# Patient Record
Sex: Male | Born: 1935 | Race: White | Hispanic: No | Marital: Married | Smoking: Former smoker
Health system: Southern US, Community
[De-identification: ages and names within clinical notes are randomized; demographics above are authoritative.]

## PROBLEM LIST (undated history)

## (undated) DIAGNOSIS — M1990 Unspecified osteoarthritis, unspecified site: Secondary | ICD-10-CM

## (undated) DIAGNOSIS — I4891 Unspecified atrial fibrillation: Secondary | ICD-10-CM

## (undated) DIAGNOSIS — I2510 Atherosclerotic heart disease of native coronary artery without angina pectoris: Secondary | ICD-10-CM

## (undated) DIAGNOSIS — E785 Hyperlipidemia, unspecified: Secondary | ICD-10-CM

## (undated) DIAGNOSIS — I10 Essential (primary) hypertension: Secondary | ICD-10-CM

## (undated) DIAGNOSIS — T4145XA Adverse effect of unspecified anesthetic, initial encounter: Secondary | ICD-10-CM

## (undated) DIAGNOSIS — IMO0001 Reserved for inherently not codable concepts without codable children: Secondary | ICD-10-CM

## (undated) DIAGNOSIS — I491 Atrial premature depolarization: Secondary | ICD-10-CM

## (undated) DIAGNOSIS — T8859XA Other complications of anesthesia, initial encounter: Secondary | ICD-10-CM

## (undated) DIAGNOSIS — I252 Old myocardial infarction: Secondary | ICD-10-CM

## (undated) DIAGNOSIS — K219 Gastro-esophageal reflux disease without esophagitis: Secondary | ICD-10-CM

## (undated) DIAGNOSIS — I519 Heart disease, unspecified: Secondary | ICD-10-CM

## (undated) DIAGNOSIS — C801 Malignant (primary) neoplasm, unspecified: Secondary | ICD-10-CM

## (undated) HISTORY — DX: Atrial premature depolarization: I49.1

## (undated) HISTORY — DX: Unspecified atrial fibrillation: I48.91

## (undated) HISTORY — DX: Old myocardial infarction: I25.2

## (undated) HISTORY — PX: COLONOSCOPY: SHX174

## (undated) HISTORY — DX: Heart disease, unspecified: I51.9

## (undated) HISTORY — DX: Essential (primary) hypertension: I10

## (undated) HISTORY — DX: Hyperlipidemia, unspecified: E78.5

## (undated) HISTORY — DX: Atherosclerotic heart disease of native coronary artery without angina pectoris: I25.10

---

## 1998-12-04 ENCOUNTER — Encounter: Payer: Self-pay | Admitting: Gastroenterology

## 1998-12-04 ENCOUNTER — Ambulatory Visit (HOSPITAL_COMMUNITY): Admission: RE | Admit: 1998-12-04 | Discharge: 1998-12-04 | Payer: Self-pay | Admitting: Gastroenterology

## 1998-12-16 ENCOUNTER — Ambulatory Visit (HOSPITAL_COMMUNITY): Admission: RE | Admit: 1998-12-16 | Discharge: 1998-12-16 | Payer: Self-pay | Admitting: Gastroenterology

## 1998-12-16 ENCOUNTER — Encounter: Payer: Self-pay | Admitting: Gastroenterology

## 1998-12-24 ENCOUNTER — Ambulatory Visit (HOSPITAL_COMMUNITY): Admission: RE | Admit: 1998-12-24 | Discharge: 1998-12-24 | Payer: Self-pay | Admitting: Gastroenterology

## 2000-03-29 ENCOUNTER — Encounter (INDEPENDENT_AMBULATORY_CARE_PROVIDER_SITE_OTHER): Payer: Self-pay

## 2000-03-29 ENCOUNTER — Ambulatory Visit (HOSPITAL_COMMUNITY): Admission: RE | Admit: 2000-03-29 | Discharge: 2000-03-29 | Payer: Self-pay | Admitting: Gastroenterology

## 2000-04-05 ENCOUNTER — Encounter: Payer: Self-pay | Admitting: Gastroenterology

## 2000-04-05 ENCOUNTER — Ambulatory Visit (HOSPITAL_COMMUNITY): Admission: RE | Admit: 2000-04-05 | Discharge: 2000-04-05 | Payer: Self-pay | Admitting: Gastroenterology

## 2003-03-02 ENCOUNTER — Ambulatory Visit (HOSPITAL_COMMUNITY): Admission: RE | Admit: 2003-03-02 | Discharge: 2003-03-02 | Payer: Self-pay | Admitting: Internal Medicine

## 2003-03-02 ENCOUNTER — Encounter: Payer: Self-pay | Admitting: Cardiology

## 2005-09-10 ENCOUNTER — Ambulatory Visit (HOSPITAL_COMMUNITY): Admission: RE | Admit: 2005-09-10 | Discharge: 2005-09-10 | Payer: Self-pay | Admitting: Internal Medicine

## 2006-11-13 DIAGNOSIS — I252 Old myocardial infarction: Secondary | ICD-10-CM

## 2006-11-13 HISTORY — DX: Old myocardial infarction: I25.2

## 2006-11-26 ENCOUNTER — Inpatient Hospital Stay (HOSPITAL_COMMUNITY): Admission: AD | Admit: 2006-11-26 | Discharge: 2006-11-30 | Payer: Self-pay | Admitting: *Deleted

## 2006-11-26 HISTORY — PX: CARDIAC CATHETERIZATION: SHX172

## 2006-11-29 ENCOUNTER — Encounter (INDEPENDENT_AMBULATORY_CARE_PROVIDER_SITE_OTHER): Payer: Self-pay | Admitting: *Deleted

## 2010-11-18 ENCOUNTER — Ambulatory Visit: Payer: Self-pay | Admitting: Cardiology

## 2011-05-01 NOTE — Cardiovascular Report (Signed)
Luke Contreras, Luke Contreras                  ACCOUNT NO.:  [id number]   MEDICAL RECORD NO.:  [id number]          PATIENT TYPE:  OIB   LOCATION:  2854                         FACILITY:  [facility]   PHYSICIAN:  Elmore Guise., M.D.DATE OF BIRTH:  04/03/1930   DATE OF PROCEDURE:  11/26/2006  DATE OF DISCHARGE:                            CARDIAC CATHETERIZATION   INDICATIONS FOR PROCEDURE:  Recent anterior MI.   HISTORY OF PRESENT ILLNESS:  Luke Contreras is a very pleasant 77 year old  white male who has been having stuttering off and on chest pain for 3  days.  His initial episode happened on Tuesday, lasting 30 minutes.  He  had a second episode on Wednesday, lasting for approximately 12 hours.  Since that time, he has had mild off and on dyspnea as well as some  uncomfortable feelings.  He was taken to the cath lab for further  evaluation.   DESCRIPTION OF PROCEDURE:  The patient was brought to the cardiac cath  lab after appropriate informed consent.  He was prepped and draped in a  sterile fashion.  Approximately 20 mL of 1% lidocaine was used for local  anesthesia.  A 6-French sheath was placed in the right femoral artery  without difficulty.  Coronary angiography, LV angiography were then  performed.  Sheath was left in place for continued intervention of his  proximal /mid LAD.   FINDINGS:  1. Left main:  Large vessel and normal-appearing  2. LAD:  Mid 100% occlusion after first septal perforator.  He does      have faint left-to-left collaterals filling his first diagonal      vessel.  3. LCX:  Nondominant, large vessel.  4. OM-1:  High branching moderate-to-large vessel with mild luminal      irregularities.  5. OM-2:  Moderate-to-large sized vessel with mild luminal      irregularities.  6. Ramus intermedius:  Moderate-to-large vessel with mild luminal      irregularities.  7. RCA:  Dominant large vessel with proximal shepherd's crook noted.      He has moderate proximal luminal  irregularities up to 30-40% noted.      He has a mid 50-60% stenosis with mild to moderate distal luminal      irregularities.  His PDA and PLV show mild luminal irregularities      with faint right to left collaterals filling proximal LAD and      septal perforators noted.  8. LV:  EF is approximately 40% with mild anterior hypokinesis and      distal anterior akinesis with periapical dyskinesis.   IMPRESSION:  1. A 100% obstructive proximal/mid left anterior descending artery,      consistent with recent anterior myocardial infarction.  2. Moderate mid right coronary artery disease.  3. Mild left ventricular dysfunction with an ejection fraction of 40%      with distal anterior akinesis and periapical dyskinesis.   PLAN:  Elective PCI proximal LAD, will start aggressive medical therapy  as indicated.      Elmore Guise., M.D.  Electronically Signed  TWK/MEDQ  D:  11/26/2006  T:  11/26/2006  Job:  [id number]

## 2011-05-01 NOTE — Cardiovascular Report (Signed)
[name], [name]                  ACCOUNT NO.:  [id number]   MEDICAL RECORD NO.:  [id number]          PATIENT TYPE:  OIB   LOCATION:  2854                         FACILITY:  [facility]   PHYSICIAN:  Vesta Mixer, M.D. DATE OF BIRTH:  March 17, 1930   DATE OF PROCEDURE:  11/26/2006  DATE OF DISCHARGE:                            CARDIAC CATHETERIZATION   Mr. Luke Contreras is a 77 year old gentleman who presents with several days of  intermittent chest pain.  He presented to Dr. Burton Apley' office.  He was found to have markedly elevated CPK level with a troponin level  of around 23.  He was referred to our office.  Dr. Reyes Ivan performed a  diagnostic heart catheterization and the patient was found to have an  occluded proximal left anterior descending artery.   He had a large ramus intermediate branch and large circumflex vessels  which had minor luminal irregularities.  The right coronary artery is  only moderate in size.  There was a 50-60% mid lesion in the right  coronary artery.  The right coronary artery supplies collaterals to the  LAD through the septal branches.   The patient has dyskinesis of the apex but with some well-preserved wall  motion of the midanterior wall.   PCI ATTEMPT/:  The sheath was upgraded to a 7-French sheath.  The left  main was cannulated using a 7-French Judkins left 5 guide.  Five  thousand units of heparin were given.  A double bolus Integrilin drip  was given.   We attempted to cross the lesion with an [employer] soft wire.  This was not  successful.  We then used a [employer] wire with an angioplasty  balloon as backup.  This also was unsuccessful even after many tries.   We decided to stop the procedure at that point.  This lesion appears to  be fairly chronic and certainly is older than 45 days old.  We will  continue with medical therapy.  We will review other options including  medical therapy versus bypass grafting.     ______________________________  Vesta Mixer, M.D.     PJN/MEDQ  D:  11/26/2006  T:  11/26/2006  Job:  [id number]

## 2011-05-01 NOTE — H&P (Signed)
NAMEJR, MILLIRON                  ACCOUNT NO.:  [id number]   MEDICAL RECORD NO.:  [id number]          PATIENT TYPE:  OIB   LOCATION:  2807                         FACILITY:  [facility]   PHYSICIAN:  Elmore Guise., M.D.DATE OF BIRTH:  [birth year]   DATE OF ADMISSION:  11/26/2006  DATE OF DISCHARGE:                              HISTORY & PHYSICAL   INDICATION FOR ADMISSION:  Recent anterior infarction with significantly  elevated troponin.   PRIMARY CARE PHYSICIAN:  Antony Madura, M.D.   HISTORY OF PRESENT ILLNESS:  The patient is a very pleasant 77 year old  white male with a past medical history of hypertension who presents for  evaluation of chest pain and malaise.  The patient reports normal state  of health until this Tuesday.  Tuesday night, he had an episode of chest  pain which he described as a retrosternal tightness that occurred while  eating dinner. This lasted somewhere between 30 and 45 minutes.  His  symptoms resolved.  He went to bed and was feeling back to normal.  He  then awoke on Wednesday, did his normal activities. On Wednesday night,  he started having some chest pain, again, while eating dinner, however,  this pain was much worse than his prior symptoms associated with  shortness of breath and nausea.  This lasted all night. The next  morning, he had some malaise and some dyspnea with exertion. He went for  evaluation, had blood work done which showed some elevated cardiac  enzymes.  He was then sent for cardiac evaluation.  The patient denies  any further chest pain, however, does report some dyspnea with brisk  walking.  He does have some off and on twinges with activity noted.  He denies any fever, no cough, no orthopnea, no PND, no palpitations. He  denies any difficulty with his current medications.   REVIEW OF SYSTEMS:  As per HPI.  All others systems are negative.   CURRENT MEDICATIONS:  Atenolol 50 mg daily and clorazepate 7.5 mg daily.   ALLERGIES:  None.   FAMILY HISTORY:  Positive for heart disease in his father, ITP in a  brother, hypertension and diabetes in a brother.   SOCIAL HISTORY:  He is married.  He is retired.  He does not currently  exercise.  He stays very active around the house and with yard work.  He  smoked for 30 pack years, quit 27 years ago.  No alcohol.  No caffeine.   PAST SURGICAL HISTORY:  None.   PHYSICAL EXAMINATION:  VITAL SIGNS:  His weight is 215 pounds.  His blood pressure is 90/50.  His heart rate is 51 and regular.  GENERAL:  He is a very pleasant elderly white male, alert and oriented  x4 in no acute distress.  HEENT: Appear normal.  NECK: Supple.  No lymphadenopathy. 2+ carotids.  No JVD and no bruits.  LUNGS:  Clear.  HEART:  Regular with a normal S1-S2, soft S4 noted.  ABDOMEN:  Soft, nontender, nondistended.  No rebound or guarding.  No  abdominal bruits.  EXTREMITIES:  Warm with 2+ pulses and no significant edema.  NEUROLOGICAL:  Cranial nerves are intact, there are no focal deficits.   His blood work was reviewed and showed a CPK of 2255 with an MB of 259  and a troponin of 24.7. His EKG shows sinus bradycardia with a rate of  51 per minute with normal axis.  He has anterior Q-waves from V1 to V3  with very minimal ST elevation and deep symmetric inverted T-waves from  V1 through V4.  His total cholesterol was 166, triglycerides were 149,  LDL was 103, and HDL of 33. A hemoglobin A1c was 5.7.  TSH was 1.2.  White count 5.9, hemoglobin of 15, platelet count of 197. BUN and  creatinine of 21 and 1. Potassium level 4.1   IMPRESSION:  1. Recent anterior myocardial infarction.  2. Continued, likely resolving, EKG changes.  3. Significant troponin elevation likely secondary to #1 above.  4. Borderline hypotension.   PLAN:  The patient will be given aspirin then taken to the cardiac cath  lab for further evaluation.  He will be treated with anticoagulants. We  will hold his  atenolol since he is profoundly bradycardic and borderline  hypotensive at this time.  I did discuss the risks and benefits of  cardiac catheterization with him.  He wishes to proceed.  We will start  aspirin, heparin, Integrilin, and statin medication.      Elmore Guise., M.D.  Electronically Signed     TWK/MEDQ  D:  11/26/2006  T:  11/26/2006  Job:  [id number]

## 2011-05-01 NOTE — Discharge Summary (Signed)
Luke Contreras, Luke Contreras                  ACCOUNT NO.:  [id number]   MEDICAL RECORD NO.:  [id number]          PATIENT TYPE:  INP   LOCATION:  2010                         FACILITY:  [facility]   PHYSICIAN:  Elmore Guise., M.D.DATE OF BIRTH:  [birth year]   DATE OF ADMISSION:  11/26/2006  DATE OF DISCHARGE:  11/30/2006                               DISCHARGE SUMMARY   DISCHARGE DIAGNOSIS:  1. Recent anterior myocardial infarction.  2. Dyslipidemia.  3. History of hypertension.  4. Brief episode of atrial fibrillation.   HISTORY OF PRESENT ILLNESS:  The patient is a very pleasant 77 year old  white male who presented with 12 hours of chest pain and abnormal  cardiac enzymes.  He was taken to the hospital for further evaluation.   HOSPITAL COURSE:  The patient was taken to the catheterization lab for  urgent evaluation.  He was found to have a 100% proximal LAD occlusion.  Multiple attempts were made to cross the blockage which were  unsuccessful.  The patient remained chest pain free and was treated  medically.  The patient stayed in the cardiac step-down unit for  approximately 3 days. On postprocedure day #1, he had a brief episode of  atrial fibrillation, heart rate increasing up to 140 beats per minute.  He became short of breath and had twinges of chest pain.  His cardiac  enzymes were cycled. He had no further worsening with his cardiac  enzymes.  His troponin I peaked at 32. He did continue to have a  evolution of his EKG changes with anterior Q waves and improving ST  elevation noted in leads V1 through V4.  He has now been up and  ambulatory for the last 48 hours. He has had no further atrial  fibrillation.  He denies any chest pain or shortness of breath.  He did  have an echocardiogram done December 17 which showed normal LV size and  function with an EF of 55-60%.  He did have a mid to distal and  periapical akinesis consistent with his anterior MI.  He was noted to  have mild  pulmonary hypertension which was unchanged from his prior  echocardiogram 2 years prior.   He will be discharged on the following medications.  1. Plavix 75 mg daily.  2. Aspirin 81 mg daily.  3. Lipitor 40 mg daily.  4. Lisinopril 2.5 mg daily.  5. Atenolol 25 mg twice daily.  6. Digoxin 0.25 mg daily.  7. Coumadin 5 mg daily.  8. Nitroglycerin 0.4 mg sublingual every 5 minutes p.r.n. chest pain.  9. Clorazepate 7.5 mg nightly as before.   His followup appointment will be with Dr. Lady Deutscher in 1 week for  office visit and PT/INR.  He will follow up with Dr. Su Hilt at his  scheduled appointment in 2 months.      Elmore Guise., M.D.  Electronically Signed     TWK/MEDQ  D:  11/30/2006  T:  11/30/2006  Job:  [id number]

## 2011-06-09 ENCOUNTER — Other Ambulatory Visit: Payer: Self-pay | Admitting: *Deleted

## 2011-06-09 MED ORDER — LISINOPRIL 2.5 MG PO TABS: 2.5000 mg | ORAL_TABLET | Freq: Every day | ORAL | Status: DC

## 2011-06-09 NOTE — Telephone Encounter (Signed)
escribe medication per fax request  

## 2011-06-12 ENCOUNTER — Encounter: Payer: Self-pay | Admitting: *Deleted

## 2011-06-19 ENCOUNTER — Encounter: Payer: Self-pay | Admitting: Cardiology

## 2011-06-19 ENCOUNTER — Other Ambulatory Visit: Payer: Self-pay | Admitting: *Deleted

## 2011-06-19 DIAGNOSIS — E785 Hyperlipidemia, unspecified: Secondary | ICD-10-CM

## 2011-06-23 ENCOUNTER — Other Ambulatory Visit (INDEPENDENT_AMBULATORY_CARE_PROVIDER_SITE_OTHER): Payer: Medicare Other | Admitting: *Deleted

## 2011-06-23 ENCOUNTER — Ambulatory Visit (INDEPENDENT_AMBULATORY_CARE_PROVIDER_SITE_OTHER): Payer: Medicare Other | Admitting: Cardiology

## 2011-06-23 ENCOUNTER — Encounter: Payer: Self-pay | Admitting: Cardiology

## 2011-06-23 DIAGNOSIS — E785 Hyperlipidemia, unspecified: Secondary | ICD-10-CM

## 2011-06-23 DIAGNOSIS — I10 Essential (primary) hypertension: Secondary | ICD-10-CM

## 2011-06-23 DIAGNOSIS — I519 Heart disease, unspecified: Secondary | ICD-10-CM

## 2011-06-23 DIAGNOSIS — I252 Old myocardial infarction: Secondary | ICD-10-CM

## 2011-06-23 DIAGNOSIS — I2510 Atherosclerotic heart disease of native coronary artery without angina pectoris: Secondary | ICD-10-CM

## 2011-06-23 DIAGNOSIS — I491 Atrial premature depolarization: Secondary | ICD-10-CM

## 2011-06-23 LAB — LIPID PANEL
LDL Cholesterol: 44 mg/dL (ref 0–99)
Total CHOL/HDL Ratio: 3
VLDL: 17.2 mg/dL (ref 0.0–40.0)

## 2011-06-23 LAB — HEPATIC FUNCTION PANEL
ALT: 23 U/L (ref 0–53)
AST: 35 U/L (ref 0–37)
Alkaline Phosphatase: 64 U/L (ref 39–117)
Total Bilirubin: 0.8 mg/dL (ref 0.3–1.2)

## 2011-06-23 LAB — BASIC METABOLIC PANEL
Chloride: 110 mEq/L (ref 96–112)
GFR: 83.25 mL/min (ref >60.00)
Potassium: 4 mEq/L (ref 3.5–5.1)
Sodium: 142 mEq/L (ref 135–145)

## 2011-06-23 NOTE — Patient Instructions (Signed)
Continue your current medications.  We will call with the results of your blood work today.  I will see you again in 6 months.

## 2011-06-24 ENCOUNTER — Encounter: Payer: Self-pay | Admitting: *Deleted

## 2011-06-24 ENCOUNTER — Telehealth: Payer: Self-pay | Admitting: *Deleted

## 2011-06-24 NOTE — Telephone Encounter (Signed)
Notified of lab results. 

## 2011-06-24 NOTE — Telephone Encounter (Signed)
Message copied by Lorayne Bender on Wed Jun 24, 2011  3:25 PM ------      Message from: [place], PETER M      Created: Tue Jun 23, 2011  3:10 PM       Chemistries, lipids look great.

## 2011-06-25 DIAGNOSIS — I491 Atrial premature depolarization: Secondary | ICD-10-CM | POA: Insufficient documentation

## 2011-06-25 DIAGNOSIS — I2510 Atherosclerotic heart disease of native coronary artery without angina pectoris: Secondary | ICD-10-CM | POA: Insufficient documentation

## 2011-06-25 DIAGNOSIS — I519 Heart disease, unspecified: Secondary | ICD-10-CM | POA: Insufficient documentation

## 2011-06-25 DIAGNOSIS — I10 Essential (primary) hypertension: Secondary | ICD-10-CM | POA: Insufficient documentation

## 2011-06-25 DIAGNOSIS — E785 Hyperlipidemia, unspecified: Secondary | ICD-10-CM | POA: Insufficient documentation

## 2011-06-25 NOTE — Progress Notes (Signed)
   Luke Contreras Date of Birth: Nov 03, 1930   History of Present Illness: Luke Contreras is seen today for followup. He previously was walking 3 miles per day. He has not been walking much recently because he states his feet bother him. He complains of burning on the bottom of his feet particularly at the end of the day. He is also felt occasional skipped beats and his heart at the end of the day. He denies any dizziness or syncope. He has had no chest pain or shortness of breath.  Current Outpatient Prescriptions on File Prior to Visit  Medication Sig Dispense Refill  . aspirin 81 MG EC tablet Take 325 mg by mouth daily.       Marland Kitchen atorvastatin (LIPITOR) 40 MG tablet Take 20 mg by mouth daily.        . clorazepate (TRANXENE) 7.5 MG tablet Take 7.5 mg by mouth at bedtime.        . Cyanocobalamin (VITAMIN B-12 PO) Take by mouth daily.        Marland Kitchen esomeprazole (NEXIUM) 40 MG capsule Take 40 mg by mouth daily before breakfast.        . lisinopril (PRINIVIL,ZESTRIL) 2.5 MG tablet Take 1 tablet (2.5 mg total) by mouth daily.  90 tablet  3  . Misc Natural Products (OSTEO BI-FLEX JOINT SHIELD PO) Take 1 tablet by mouth daily.        . multivitamin (THERAGRAN) per tablet Take 1 tablet by mouth daily.        . niacin (NIASPAN) 500 MG CR tablet Take 500 mg by mouth at bedtime.         No Known Allergies  Past Medical History  Diagnosis Date  . History of acute anterior wall myocardial infarction 11/2006  . Atrial fibrillation   . Dyslipidemia   . Hypertension   . Coronary artery disease     history of  obstructive single-valve disease  . PAC (premature atrial contraction)   . Left ventricular dysfunction     withEF of 40-45%     Past Surgical History  Procedure Date  . Cardiac catheterization 11/26/2006    Est. EF of 40% --  A 100% obstructive proximal/mid left anterior descending artery consistent with recent anterior myocardial infarction --  Moderate mid right coronary artery disease --Mild left  ventricular dysfunction with EF of 40%  with distal anterior akinesis and periapical dyskinesis    -- Elmore Guise., M.D.    History  Smoking status  . Former Smoker -- 1.5 packs/day for 30 years  . Types: Cigarettes  . Quit date: 12/14/1978  Smokeless tobacco  . Not on file    History  Alcohol Use No    Family History  Problem Relation Age of Onset  . Heart attack Father 52    Review of Systems: As noted in history of present illness. All other systems were reviewed and are negative.  Physical Exam: BP 112/62  Pulse 60  Ht 5\' 10"  (1.778 m)  Wt 188 lb (85.276 kg)  BMI 26.98 kg/m2 He is a pleasant white male in no acute distress. His HEENT exam is unremarkable. He has no JVD or bruits. Lungs are clear. Cardiac exam reveals a regular rate and rhythm without gallop, murmur, or click. Abdomen is soft and nontender. He has no masses. Pedal pulses are 2+. He has no edema. His neurologic exam is nonfocal. LABORATORY DATA:   Assessment / Plan:

## 2011-06-25 NOTE — Assessment & Plan Note (Signed)
We will followup on his fasting lab work today.

## 2011-06-25 NOTE — Assessment & Plan Note (Signed)
He has no signs or symptoms of congestive heart failure. He is on appropriate therapy with lisinopril.

## 2011-06-25 NOTE — Assessment & Plan Note (Signed)
I think the minor palpitations that he is having in the evening are related to his chronic PACs.

## 2011-06-25 NOTE — Assessment & Plan Note (Signed)
We will continue with his risk factor modification. His last stress Cardiolite study in July of 2010 showed evidence of his prior anterior infarction without other ischemia.

## 2012-02-23 ENCOUNTER — Encounter: Payer: Self-pay | Admitting: Cardiology

## 2012-03-28 ENCOUNTER — Ambulatory Visit
Admission: RE | Admit: 2012-03-28 | Discharge: 2012-03-28 | Disposition: A | Discharge status: Home / Self Care | Payer: Medicare Other | Source: Ambulatory Visit | Attending: Internal Medicine | Admitting: Internal Medicine

## 2012-03-28 ENCOUNTER — Other Ambulatory Visit: Payer: Self-pay | Admitting: Internal Medicine

## 2012-03-28 DIAGNOSIS — M549 Dorsalgia, unspecified: Secondary | ICD-10-CM

## 2012-04-05 ENCOUNTER — Telehealth: Payer: Self-pay | Admitting: Cardiology

## 2012-04-05 ENCOUNTER — Ambulatory Visit (INDEPENDENT_AMBULATORY_CARE_PROVIDER_SITE_OTHER): Payer: Medicare Other | Admitting: Cardiology

## 2012-04-05 ENCOUNTER — Encounter: Payer: Self-pay | Admitting: Cardiology

## 2012-04-05 VITALS — BP 130/60 | HR 56 | Ht 70.0 in | Wt 193.0 lb

## 2012-04-05 DIAGNOSIS — I493 Ventricular premature depolarization: Secondary | ICD-10-CM

## 2012-04-05 DIAGNOSIS — I519 Heart disease, unspecified: Secondary | ICD-10-CM

## 2012-04-05 DIAGNOSIS — I4949 Other premature depolarization: Secondary | ICD-10-CM

## 2012-04-05 DIAGNOSIS — E785 Hyperlipidemia, unspecified: Secondary | ICD-10-CM

## 2012-04-05 DIAGNOSIS — I2510 Atherosclerotic heart disease of native coronary artery without angina pectoris: Secondary | ICD-10-CM

## 2012-04-05 MED ORDER — LISINOPRIL 2.5 MG PO TABS: 2.5000 mg | ORAL_TABLET | Freq: Every day | ORAL | Status: DC

## 2012-04-05 MED ORDER — NIACIN ER (ANTIHYPERLIPIDEMIC) 500 MG PO TBCR: 500.0000 mg | EXTENDED_RELEASE_TABLET | Freq: Every day | ORAL | Status: DC

## 2012-04-05 MED ORDER — ATORVASTATIN CALCIUM 40 MG PO TABS: 40.0000 mg | ORAL_TABLET | Freq: Every day | ORAL | Status: DC

## 2012-04-05 NOTE — Progress Notes (Signed)
Luke Contreras Date of Birth: 1930/05/04   History of Present Illness: Luke Contreras is seen today for followup. He reports he is doing very well from a cardiac standpoint. He continues with regular exercise. Over the past several days he has had progressive hoarseness which he relates to the pollen. He was diagnosed recently with anemia. He underwent colonoscopy last week which was negative. He denies any chest pain or shortness of breath.  Current Outpatient Prescriptions on File Prior to Visit  Medication Sig Dispense Refill  . aspirin 81 MG EC tablet Take 325 mg by mouth daily.       . clorazepate (TRANXENE) 7.5 MG tablet Take 7.5 mg by mouth at bedtime.        . Cyanocobalamin (VITAMIN B-12 PO) Take by mouth daily.        Marland Kitchen lisinopril (PRINIVIL,ZESTRIL) 2.5 MG tablet Take 1 tablet (2.5 mg total) by mouth daily.  90 tablet  3  . Misc Natural Products (OSTEO BI-FLEX JOINT SHIELD PO) Take 1 tablet by mouth daily.        . multivitamin (THERAGRAN) per tablet Take 1 tablet by mouth daily.        Marland Kitchen omeprazole (PRILOSEC) 20 MG capsule Take 20 mg by mouth daily.       Marland Kitchen DISCONTD: atorvastatin (LIPITOR) 40 MG tablet Take 20 mg by mouth daily.        Marland Kitchen DISCONTD: niacin (NIASPAN) 500 MG CR tablet Take 500 mg by mouth at bedtime.         No Known Allergies  Past Medical History  Diagnosis Date  . History of acute anterior wall myocardial infarction 11/2006  . Atrial fibrillation   . Dyslipidemia   . Hypertension   . Coronary artery disease     history of  obstructive single-valve disease  . PAC (premature atrial contraction)   . Left ventricular dysfunction     withEF of 40-45%     Past Surgical History  Procedure Date  . Cardiac catheterization 11/26/2006    Est. EF of 40% --  A 100% obstructive proximal/mid left anterior descending artery consistent with recent anterior myocardial infarction --  Moderate mid right coronary artery disease --Mild left ventricular dysfunction with EF of 40%   with distal anterior akinesis and periapical dyskinesis    -- Elmore Guise., M.D.    History  Smoking status  . Former Smoker -- 1.5 packs/day for 30 years  . Types: Cigarettes  . Quit date: 12/14/1978  Smokeless tobacco  . Not on file    History  Alcohol Use No    Family History  Problem Relation Age of Onset  . Heart attack Father 40    Review of Systems: As noted in history of present illness. All other systems were reviewed and are negative.  Physical Exam: BP 130/60  Pulse 56  Ht 5\' 10"  (1.778 m)  Wt 87.544 kg (193 lb)  BMI 27.69 kg/m2 He is a pleasant white male in no acute distress. His HEENT exam is unremarkable. He has no JVD or bruits. Lungs are clear. Cardiac exam reveals a regular rate and rhythm without gallop, murmur, or click. Abdomen is soft and nontender. He has no masses. Pedal pulses are 2+. He has no edema. His neurologic exam is nonfocal. LABORATORY DATA:  ECG today demonstrates normal sinus rhythm with a rate of 56 beats per minute. He has a first degree AV block. There is evidence of old anteroseptal infarction.  Assessment / Plan:

## 2012-04-05 NOTE — Telephone Encounter (Signed)
Patient's wife was called back stated need refill on lisinopril 2.5 mg sent to [facility].

## 2012-04-05 NOTE — Assessment & Plan Note (Signed)
Requested copies of his most recent lab work from Dr. Su Hilt. For now he will continue with Lipitor 40 mg daily and Niaspan 500 mg daily.

## 2012-04-05 NOTE — Telephone Encounter (Signed)
Fu call Pt's wife called back and said lisinopril dosage in 2.5 mg. Pharmacy is [facility]. Call her back if have more questions

## 2012-04-05 NOTE — Assessment & Plan Note (Signed)
He has no signs or symptoms of congestive heart failure. He will continue with his ACE inhibitor therapy. Continue sodium restriction.

## 2012-04-05 NOTE — Patient Instructions (Signed)
Continue your current medication.  I will see you again in 6 months.   

## 2012-04-05 NOTE — Assessment & Plan Note (Signed)
He is status post remote anterior myocardial infarction with unsuccessful reperfusion. He had single-vessel disease at the time. He remains asymptomatic. We'll continue with his medical therapy including aspirin, ACE inhibitor, and statin. He is not a candidate for beta blockers because of his bradycardia.

## 2012-04-07 ENCOUNTER — Telehealth: Payer: Self-pay | Admitting: Cardiology

## 2012-04-07 DIAGNOSIS — E785 Hyperlipidemia, unspecified: Secondary | ICD-10-CM

## 2012-04-07 DIAGNOSIS — I2510 Atherosclerotic heart disease of native coronary artery without angina pectoris: Secondary | ICD-10-CM

## 2012-04-07 NOTE — Telephone Encounter (Signed)
Fu call °Patient returning your call °

## 2012-04-07 NOTE — Telephone Encounter (Signed)
Patient's wife called was told Dr.Jordan advises to have fasting bmet,lipids,hepatic.Patient will have labs tomorrow 4/226/13.

## 2012-04-08 ENCOUNTER — Other Ambulatory Visit (INDEPENDENT_AMBULATORY_CARE_PROVIDER_SITE_OTHER): Payer: Medicare Other

## 2012-04-08 DIAGNOSIS — I2510 Atherosclerotic heart disease of native coronary artery without angina pectoris: Secondary | ICD-10-CM

## 2012-04-08 DIAGNOSIS — E785 Hyperlipidemia, unspecified: Secondary | ICD-10-CM

## 2012-04-08 LAB — BASIC METABOLIC PANEL
BUN: 18 mg/dL (ref 6–23)
Calcium: 8.8 mg/dL (ref 8.4–10.5)
Chloride: 108 mEq/L (ref 96–112)
Creatinine, Ser: 1 mg/dL (ref 0.4–1.5)
GFR: 78.21 mL/min (ref >60.00)

## 2012-04-08 LAB — HEPATIC FUNCTION PANEL
AST: 65 U/L — ABNORMAL HIGH (ref 0–37)
Alkaline Phosphatase: 104 U/L (ref 39–117)
Bilirubin, Direct: 0.1 mg/dL (ref 0.0–0.3)
Total Protein: 6.7 g/dL (ref 6.0–8.3)

## 2012-04-08 LAB — LIPID PANEL
Cholesterol: 102 mg/dL (ref 0–200)
LDL Cholesterol: 49 mg/dL (ref 0–99)

## 2012-04-18 ENCOUNTER — Other Ambulatory Visit: Payer: Self-pay | Admitting: Internal Medicine

## 2012-04-18 DIAGNOSIS — R937 Abnormal findings on diagnostic imaging of other parts of musculoskeletal system: Secondary | ICD-10-CM

## 2012-04-19 ENCOUNTER — Ambulatory Visit
Admission: RE | Admit: 2012-04-19 | Discharge: 2012-04-19 | Disposition: A | Discharge status: Home / Self Care | Payer: Medicare Other | Source: Ambulatory Visit | Attending: Internal Medicine | Admitting: Internal Medicine

## 2012-04-19 DIAGNOSIS — R937 Abnormal findings on diagnostic imaging of other parts of musculoskeletal system: Secondary | ICD-10-CM

## 2012-06-02 ENCOUNTER — Other Ambulatory Visit: Payer: Self-pay | Admitting: Cardiology

## 2012-06-02 NOTE — Telephone Encounter (Signed)
Refilled lisinopril

## 2012-09-16 ENCOUNTER — Encounter: Payer: Self-pay | Admitting: Cardiology

## 2012-09-16 ENCOUNTER — Ambulatory Visit (INDEPENDENT_AMBULATORY_CARE_PROVIDER_SITE_OTHER): Payer: Medicare Other | Admitting: Cardiology

## 2012-09-16 VITALS — BP 108/56 | HR 68 | Ht 70.0 in | Wt 190.1 lb

## 2012-09-16 DIAGNOSIS — E785 Hyperlipidemia, unspecified: Secondary | ICD-10-CM

## 2012-09-16 DIAGNOSIS — I10 Essential (primary) hypertension: Secondary | ICD-10-CM

## 2012-09-16 DIAGNOSIS — I519 Heart disease, unspecified: Secondary | ICD-10-CM

## 2012-09-16 DIAGNOSIS — I2510 Atherosclerotic heart disease of native coronary artery without angina pectoris: Secondary | ICD-10-CM

## 2012-09-16 DIAGNOSIS — I252 Old myocardial infarction: Secondary | ICD-10-CM

## 2012-09-16 NOTE — Progress Notes (Signed)
Luke Contreras Date of Birth: 08/05/1930   History of Present Illness: Mr. Olbrich is seen today for followup. He has a history of intramyocardial infarction with unsuccessful reperfusion. He has occlusion of the LAD chronically with moderate left ventricular dysfunction. Ejection fraction is estimated at 40-45%. His last Myoview study in July of 2010 showed distal anterior and apical wall scar with ejection fraction 43%. He is intolerant to beta blockers because of chronic bradycardia. On followup today he is doing well. He denies any symptoms of chest pain or shortness of breath. He does notice his heart beating slower at times. He apparently was mildly anemic but reports his most recent hemoglobin was back up.  Current Outpatient Prescriptions on File Prior to Visit  Medication Sig Dispense Refill  . aspirin 81 MG EC tablet Take 325 mg by mouth daily.       . clorazepate (TRANXENE) 7.5 MG tablet Take 7.5 mg by mouth at bedtime.        . Cyanocobalamin (VITAMIN B-12 PO) Take by mouth daily.        Marland Kitchen lisinopril (PRINIVIL,ZESTRIL) 2.5 MG tablet take 1 tablet by mouth once daily  90 tablet  3  . methocarbamol (ROBAXIN) 750 MG tablet Take 750 mg by mouth 3 (three) times daily.       . Misc Natural Products (OSTEO BI-FLEX JOINT SHIELD PO) Take 1 tablet by mouth daily.        . multivitamin (THERAGRAN) per tablet Take 1 tablet by mouth daily.        Marland Kitchen omeprazole (PRILOSEC) 20 MG capsule Take 20 mg by mouth daily.       Marland Kitchen DISCONTD: atorvastatin (LIPITOR) 40 MG tablet Take 1 tablet (40 mg total) by mouth daily.  90 tablet  3  . DISCONTD: niacin (NIASPAN) 500 MG CR tablet Take 1 tablet (500 mg total) by mouth at bedtime.  90 tablet  3  . DISCONTD: lisinopril (PRINIVIL,ZESTRIL) 2.5 MG tablet Take 1 tablet (2.5 mg total) by mouth daily.  90 tablet  3    No Known Allergies  Past Medical History  Diagnosis Date  . History of acute anterior wall myocardial infarction 11/2006  . Atrial fibrillation   .  Dyslipidemia   . Hypertension   . Coronary artery disease     history of  obstructive single-valve disease  . PAC (premature atrial contraction)   . Left ventricular dysfunction     withEF of 40-45%     Past Surgical History  Procedure Date  . Cardiac catheterization 11/26/2006    Est. EF of 40% --  A 100% obstructive proximal/mid left anterior descending artery consistent with recent anterior myocardial infarction --  Moderate mid right coronary artery disease --Mild left ventricular dysfunction with EF of 40%  with distal anterior akinesis and periapical dyskinesis    -- Elmore Guise., M.D.    History  Smoking status  . Former Smoker -- 1.5 packs/day for 30 years  . Types: Cigarettes  . Quit date: 12/14/1978  Smokeless tobacco  . Not on file    History  Alcohol Use No    Family History  Problem Relation Age of Onset  . Heart attack Father 96    Review of Systems: As noted in history of present illness. All other systems were reviewed and are negative.  Physical Exam: BP 108/56  Pulse 68  Ht 5\' 10"  (1.778 m)  Wt 190 lb 1.9 oz (86.238 kg)  BMI 27.28  kg/m2  SpO2 96% He is a pleasant white male in no acute distress. His HEENT exam is unremarkable. He has no JVD or bruits. Lungs are clear. Cardiac exam reveals a regular rate and rhythm without gallop, murmur, or click. Abdomen is soft and nontender. He has no masses. Pedal pulses are 2+. He has no edema. His neurologic exam is nonfocal. LABORATORY DATA:  Assessment / Plan: 1. Old anterior myocardial infarction with occlusion of the LAD. Patient is asymptomatic. Continue aspirin therapy and statin therapy. He is not a candidate for beta blockade because of bradycardia.  2. Left ventricular dysfunction, moderate. He is asymptomatic. We will continue with ACE inhibitor.  3. Dyslipidemia. He is on Lipitor and niacin. We will obtain a copy of his most recent lab work from Dr. Su Hilt.

## 2012-09-16 NOTE — Patient Instructions (Addendum)
Continue your current therapy.  I will see you again in 6 months.  We'll get a copy of your lab work from Dr. Su Hilt.

## 2013-02-06 ENCOUNTER — Telehealth: Payer: Self-pay | Admitting: Cardiology

## 2013-02-06 DIAGNOSIS — I10 Essential (primary) hypertension: Secondary | ICD-10-CM

## 2013-02-06 NOTE — Telephone Encounter (Signed)
Pt wants to  Know if blood work needs to be done at next ov in April? pls call

## 2013-02-06 NOTE — Telephone Encounter (Signed)
Pt has an appointment with Dr. Swaz[place]and on April 28 th at 11:15 am; pt would like to know if he needs to have labs done prior coming for the OV . Pt is taken Lipitor  medication, and   Dr Su Hilt PCP did not check Lipids on his last lab work six months ago.

## 2013-02-06 NOTE — Telephone Encounter (Signed)
Yes let's do fasting lab with BMET, LFTs, and lipids.  Dequan Kindred Swaziland MD, [facility]

## 2013-02-07 NOTE — Telephone Encounter (Signed)
Patient called no answer.Unable to leave a message.

## 2013-02-21 NOTE — Addendum Note (Signed)
Addended by: Meda Klinefelter D on: 02/21/2013 02:59 PM   Modules accepted: Orders

## 2013-02-21 NOTE — Telephone Encounter (Signed)
Spoke with patient 's wife was told Dr.Jordan advised may have fasting bmet,lipids,hepatic panels before 4/14 appointment.Lab appointment scheduled 04/03/13.

## 2013-03-13 ENCOUNTER — Encounter: Payer: Self-pay | Admitting: *Deleted

## 2013-03-15 ENCOUNTER — Encounter: Payer: Self-pay | Admitting: *Deleted

## 2013-04-03 ENCOUNTER — Other Ambulatory Visit (INDEPENDENT_AMBULATORY_CARE_PROVIDER_SITE_OTHER): Payer: Medicare Other

## 2013-04-03 DIAGNOSIS — E785 Hyperlipidemia, unspecified: Secondary | ICD-10-CM

## 2013-04-03 DIAGNOSIS — I10 Essential (primary) hypertension: Secondary | ICD-10-CM

## 2013-04-03 LAB — LIPID PANEL
Cholesterol: 91 mg/dL (ref 0–200)
LDL Cholesterol: 45 mg/dL (ref 0–99)
Total CHOL/HDL Ratio: 3

## 2013-04-03 LAB — HEPATIC FUNCTION PANEL
AST: 33 U/L (ref 0–37)
Alkaline Phosphatase: 84 U/L (ref 39–117)
Bilirubin, Direct: 0.2 mg/dL (ref 0.0–0.3)
Total Protein: 7.1 g/dL (ref 6.0–8.3)

## 2013-04-03 LAB — BASIC METABOLIC PANEL
BUN: 19 mg/dL (ref 6–23)
Calcium: 8.6 mg/dL (ref 8.4–10.5)
Chloride: 103 mEq/L (ref 96–112)
Creatinine, Ser: 1 mg/dL (ref 0.4–1.5)

## 2013-04-10 ENCOUNTER — Encounter: Payer: Self-pay | Admitting: Cardiology

## 2013-04-10 ENCOUNTER — Ambulatory Visit (INDEPENDENT_AMBULATORY_CARE_PROVIDER_SITE_OTHER): Payer: Medicare Other | Admitting: Cardiology

## 2013-04-10 VITALS — BP 110/50 | HR 67 | Ht 70.0 in | Wt 192.4 lb

## 2013-04-10 DIAGNOSIS — I519 Heart disease, unspecified: Secondary | ICD-10-CM

## 2013-04-10 DIAGNOSIS — I10 Essential (primary) hypertension: Secondary | ICD-10-CM

## 2013-04-10 DIAGNOSIS — I252 Old myocardial infarction: Secondary | ICD-10-CM

## 2013-04-10 DIAGNOSIS — E785 Hyperlipidemia, unspecified: Secondary | ICD-10-CM

## 2013-04-10 DIAGNOSIS — I2510 Atherosclerotic heart disease of native coronary artery without angina pectoris: Secondary | ICD-10-CM

## 2013-04-10 MED ORDER — NIACIN ER (ANTIHYPERLIPIDEMIC) 500 MG PO TBCR: 500.0000 mg | EXTENDED_RELEASE_TABLET | Freq: Three times a day (TID) | ORAL | Status: DC

## 2013-04-10 MED ORDER — ATORVASTATIN CALCIUM 40 MG PO TABS: 20.0000 mg | ORAL_TABLET | Freq: Every day | ORAL | Status: DC

## 2013-04-10 MED ORDER — LISINOPRIL 2.5 MG PO TABS: 2.5000 mg | ORAL_TABLET | Freq: Every day | ORAL | Status: DC

## 2013-04-10 NOTE — Progress Notes (Signed)
Luke Contreras Date of Birth: 1930-01-28   History of Present Illness: Luke Contreras is seen today for followup. He has a history of an anterior myocardial infarction with unsuccessful reperfusion. He has occlusion of the LAD chronically with moderate left ventricular dysfunction. Ejection fraction is estimated at 40-45%. His last Myoview study in July of 2010 showed distal anterior and apical wall scar with ejection fraction 43%. He is intolerant to beta blockers because of chronic bradycardia. On followup today he is doing well. He complains of increased pain in his feet and has chronic arthralgias in his knees. As a result he hasn't been doing as much walking. He denies any chest pain or shortness of breath. Overall he feels well.  Current Outpatient Prescriptions on File Prior to Visit  Medication Sig Dispense Refill  . aspirin 81 MG EC tablet Take 325 mg by mouth daily.       . clorazepate (TRANXENE) 7.5 MG tablet Take 7.5 mg by mouth at bedtime.        . Cyanocobalamin (VITAMIN B-12 PO) Take by mouth daily.        . methocarbamol (ROBAXIN) 750 MG tablet Take 750 mg by mouth 3 (three) times daily.       . Misc Natural Products (OSTEO BI-FLEX JOINT SHIELD PO) Take 1 tablet by mouth daily.        . multivitamin (THERAGRAN) per tablet Take 1 tablet by mouth daily.        Marland Kitchen omeprazole (PRILOSEC) 20 MG capsule Take 20 mg by mouth daily.       . traMADol (ULTRAM) 50 MG tablet Take 50 mg by mouth every 6 (six) hours as needed.       No current facility-administered medications on file prior to visit.    No Known Allergies  Past Medical History  Diagnosis Date  . History of acute anterior wall myocardial infarction 11/2006  . Atrial fibrillation   . Dyslipidemia   . Hypertension   . Coronary artery disease     history of  obstructive single-valve disease  . PAC (premature atrial contraction)   . Left ventricular dysfunction     withEF of 40-45%     Past Surgical History  Procedure  Laterality Date  . Cardiac catheterization  11/26/2006    Est. EF of 40% --  A 100% obstructive proximal/mid left anterior descending artery consistent with recent anterior myocardial infarction --  Moderate mid right coronary artery disease --Mild left ventricular dysfunction with EF of 40%  with distal anterior akinesis and periapical dyskinesis    -- Elmore Guise., M.D.    History  Smoking status  . Former Smoker -- 1.50 packs/day for 30 years  . Types: Cigarettes  . Quit date: 12/14/1978  Smokeless tobacco  . Not on file    History  Alcohol Use No    Family History  Problem Relation Age of Onset  . Heart attack Father 24    Review of Systems: As noted in history of present illness. All other systems were reviewed and are negative.  Physical Exam: BP 110/50  Pulse 67  Ht 5\' 10"  (1.778 m)  Wt 192 lb 6.4 oz (87.272 kg)  BMI 27.61 kg/m2  SpO2 97% He is a pleasant white male in no acute distress. His HEENT exam is unremarkable. He has no JVD or bruits. Lungs are clear. Cardiac exam reveals a regular rate and rhythm without gallop, murmur, or click. Abdomen is soft and nontender.  He has no masses. Pedal pulses are 2+. He has no edema. His neurologic exam is nonfocal. LABORATORY DATA: Lab Results  Component Value Date   GLUCOSE 95 04/03/2013   CHOL 91 04/03/2013   TRIG 58.0 04/03/2013   HDL 34.40* 04/03/2013   LDLCALC 45 04/03/2013   ALT 26 04/03/2013   AST 33 04/03/2013   NA 137 04/03/2013   K 3.8 04/03/2013   CL 103 04/03/2013   CREATININE 1.0 04/03/2013   BUN 19 04/03/2013   CO2 28 04/03/2013    Assessment / Plan: 1. Old anterior myocardial infarction with occlusion of the LAD. Patient is asymptomatic. Continue aspirin therapy and statin therapy. He is not a candidate for beta blockade because of bradycardia. I have encouraged him to increase his aerobic activity. I suggested he use a stationary bike or swim to get his exercise.  2. Left ventricular dysfunction,  moderate. He is asymptomatic. We will continue with ACE inhibitor.  3. Dyslipidemia. He is on Lipitor and niacin. Labs look excellent. Continue his current therapy.

## 2013-04-10 NOTE — Patient Instructions (Signed)
Continue your current therapy  Increase you aerobic exercise- stationary bike or swimming.  I will see you in 6 months.

## 2013-09-12 ENCOUNTER — Encounter: Payer: Self-pay | Admitting: Cardiology

## 2013-10-04 ENCOUNTER — Ambulatory Visit (INDEPENDENT_AMBULATORY_CARE_PROVIDER_SITE_OTHER): Payer: Medicare Other | Admitting: Cardiology

## 2013-10-04 ENCOUNTER — Encounter: Payer: Self-pay | Admitting: Cardiology

## 2013-10-04 VITALS — BP 114/60 | HR 71 | Ht 70.0 in | Wt 195.8 lb

## 2013-10-04 DIAGNOSIS — E785 Hyperlipidemia, unspecified: Secondary | ICD-10-CM

## 2013-10-04 DIAGNOSIS — I491 Atrial premature depolarization: Secondary | ICD-10-CM

## 2013-10-04 DIAGNOSIS — I2510 Atherosclerotic heart disease of native coronary artery without angina pectoris: Secondary | ICD-10-CM

## 2013-10-04 DIAGNOSIS — I10 Essential (primary) hypertension: Secondary | ICD-10-CM

## 2013-10-04 DIAGNOSIS — I519 Heart disease, unspecified: Secondary | ICD-10-CM

## 2013-10-04 NOTE — Patient Instructions (Signed)
Stop Niaspan  Continue your other therapy  We will schedule you for a nuclear stress test.  I will see you in 6 months.

## 2013-10-04 NOTE — Progress Notes (Signed)
Rockey Situ Date of Birth: Oct 08, 1930   History of Present Illness: Mr. Lobos is seen today for followup. He has a history of an anterior myocardial infarction with unsuccessful reperfusion. He has occlusion of the LAD chronically with moderate left ventricular dysfunction. Ejection fraction is estimated at 40-45%. His last Myoview study in July of 2010 showed distal anterior and apical wall scar with ejection fraction 43%. He is intolerant to beta blockers because of chronic bradycardia. On followup today he is doing well. His exercise is limited by chronic pain but he stays very active and does a lot of work in his yard. Occasionally he will feel an erratic heartbeat at night if he is overly tired. He has had some recent skin cancers removed.  Current Outpatient Prescriptions on File Prior to Visit  Medication Sig Dispense Refill  . aspirin 81 MG EC tablet Take 325 mg by mouth daily.       Marland Kitchen atorvastatin (LIPITOR) 40 MG tablet Take 0.5 tablets (20 mg total) by mouth daily.  45 tablet  11  . clorazepate (TRANXENE) 7.5 MG tablet Take 7.5 mg by mouth at bedtime.        . Cyanocobalamin (VITAMIN B-12 PO) Take by mouth daily.        Marland Kitchen lisinopril (PRINIVIL,ZESTRIL) 2.5 MG tablet Take 1 tablet (2.5 mg total) by mouth daily.  90 tablet  3  . methocarbamol (ROBAXIN) 750 MG tablet Take 750 mg by mouth 3 (three) times daily.       . Misc Natural Products (OSTEO BI-FLEX JOINT SHIELD PO) Take 1 tablet by mouth daily.        . multivitamin (THERAGRAN) per tablet Take 1 tablet by mouth daily.        . niacin (NIASPAN) 500 MG CR tablet Take 1 tablet (500 mg total) by mouth 3 (three) times daily.  90 tablet  3  . omeprazole (PRILOSEC) 20 MG capsule Take 20 mg by mouth daily.        No current facility-administered medications on file prior to visit.    No Known Allergies  Past Medical History  Diagnosis Date  . History of acute anterior wall myocardial infarction 11/2006  . Atrial fibrillation   .  Dyslipidemia   . Hypertension   . Coronary artery disease     history of  obstructive single-valve disease  . PAC (premature atrial contraction)   . Left ventricular dysfunction     withEF of 40-45%     Past Surgical History  Procedure Laterality Date  . Cardiac catheterization  11/26/2006    Est. EF of 40% --  A 100% obstructive proximal/mid left anterior descending artery consistent with recent anterior myocardial infarction --  Moderate mid right coronary artery disease --Mild left ventricular dysfunction with EF of 40%  with distal anterior akinesis and periapical dyskinesis    -- Elmore Guise., M.D.    History  Smoking status  . Former Smoker -- 1.50 packs/day for 30 years  . Types: Cigarettes  . Quit date: 12/14/1978  Smokeless tobacco  . Not on file    History  Alcohol Use No    Family History  Problem Relation Age of Onset  . Heart attack Father 9    Review of Systems: As noted in history of present illness. All other systems were reviewed and are negative.  Physical Exam: BP 114/60  Pulse 71  Ht 5\' 10"  (1.778 m)  Wt 195 lb 12.8 oz (88.814  kg)  BMI 28.09 kg/m2  SpO2 94% He is a pleasant white male in no acute distress. His HEENT exam is unremarkable. He has no JVD or bruits. Lungs are clear. Cardiac exam reveals a regular rate and rhythm without gallop, murmur, or click. Abdomen is soft and nontender. He has no masses. Pedal pulses are 2+. He has no edema. His neurologic exam is nonfocal.  LABORATORY DATA: Lab Results  Component Value Date   GLUCOSE 95 04/03/2013   CHOL 91 04/03/2013   TRIG 58.0 04/03/2013   HDL 34.40* 04/03/2013   LDLCALC 45 04/03/2013   ALT 26 04/03/2013   AST 33 04/03/2013   NA 137 04/03/2013   K 3.8 04/03/2013   CL 103 04/03/2013   CREATININE 1.0 04/03/2013   BUN 19 04/03/2013   CO2 28 04/03/2013    Assessment / Plan: 1. Old anterior myocardial infarction with occlusion of the LAD. Patient is asymptomatic. Continue aspirin therapy  and statin therapy. He is not a candidate for beta blockade because of bradycardia. I have recommended a followup stress Myoview study. Otherwise I'll followup again in 6 months.  2. Hypercholesterolemia. Given the lack of outcome benefit and a recent large trial of recommended stopping niacin. He will continue Lipitor. Next  3. Chronic congestive heart failure with moderate left ventricular dysfunction. He is asymptomatic. Continue ACE inhibitor therapy.

## 2013-10-30 ENCOUNTER — Encounter (HOSPITAL_COMMUNITY): Payer: Medicare Other

## 2013-12-22 ENCOUNTER — Other Ambulatory Visit: Payer: Self-pay

## 2013-12-22 DIAGNOSIS — I2510 Atherosclerotic heart disease of native coronary artery without angina pectoris: Secondary | ICD-10-CM

## 2013-12-22 MED ORDER — ATORVASTATIN CALCIUM 40 MG PO TABS: 20.0000 mg | ORAL_TABLET | Freq: Every day | ORAL | Status: DC

## 2013-12-22 MED ORDER — LISINOPRIL 2.5 MG PO TABS: 2.5000 mg | ORAL_TABLET | Freq: Every day | ORAL | Status: DC

## 2014-05-03 ENCOUNTER — Encounter: Payer: Self-pay | Admitting: Cardiology

## 2014-05-03 ENCOUNTER — Ambulatory Visit (INDEPENDENT_AMBULATORY_CARE_PROVIDER_SITE_OTHER): Payer: Medicare Other | Admitting: Cardiology

## 2014-05-03 VITALS — BP 102/52 | HR 60 | Ht 70.0 in | Wt 195.0 lb

## 2014-05-03 DIAGNOSIS — I2510 Atherosclerotic heart disease of native coronary artery without angina pectoris: Secondary | ICD-10-CM

## 2014-05-03 DIAGNOSIS — I10 Essential (primary) hypertension: Secondary | ICD-10-CM

## 2014-05-03 DIAGNOSIS — E785 Hyperlipidemia, unspecified: Secondary | ICD-10-CM

## 2014-05-03 DIAGNOSIS — I519 Heart disease, unspecified: Secondary | ICD-10-CM

## 2014-05-03 NOTE — Progress Notes (Signed)
Luke Contreras Date of Birth: October 19, 1930   History of Present Illness: Luke Contreras is seen today for followup. He has a history of an anterior myocardial infarction with unsuccessful reperfusion. He has occlusion of the LAD chronically with moderate left ventricular dysfunction. Ejection fraction is estimated at 40-45%. His last Myoview study in July of 2010 showed distal anterior and apical wall scar with ejection fraction 43%. He is intolerant to beta blockers because of chronic bradycardia. On followup today he is doing well. He doesn't exercise much anymore because of chronic feet problems. Remains active doing yard work and working on old cars. No chest pain or SOB. He had a persistent cough this winter that has resolved. He has some intermittent hoarseness.  Current Outpatient Prescriptions on File Prior to Visit  Medication Sig Dispense Refill  . atorvastatin (LIPITOR) 40 MG tablet Take 0.5 tablets (20 mg total) by mouth daily.  45 tablet  4  . clorazepate (TRANXENE) 7.5 MG tablet Take 7.5 mg by mouth at bedtime.        . Cyanocobalamin (VITAMIN B-12 PO) Take by mouth daily.        Marland Kitchen lisinopril (PRINIVIL,ZESTRIL) 2.5 MG tablet Take 1 tablet (2.5 mg total) by mouth daily.  90 tablet  3  . Misc Natural Products (OSTEO BI-FLEX JOINT SHIELD PO) Take 1 tablet by mouth daily.        . multivitamin (THERAGRAN) per tablet Take 1 tablet by mouth daily.        Marland Kitchen NITROSTAT 0.4 MG SL tablet       . omeprazole (PRILOSEC) 20 MG capsule Take 20 mg by mouth daily.        No current facility-administered medications on file prior to visit.    No Known Allergies  Past Medical History  Diagnosis Date  . History of acute anterior wall myocardial infarction 11/2006  . Atrial fibrillation   . Dyslipidemia   . Hypertension   . Coronary artery disease     history of  obstructive single-valve disease  . PAC (premature atrial contraction)   . Left ventricular dysfunction     withEF of 40-45%     Past  Surgical History  Procedure Laterality Date  . Cardiac catheterization  11/26/2006    Est. EF of 40% --  A 100% obstructive proximal/mid left anterior descending artery consistent with recent anterior myocardial infarction --  Moderate mid right coronary artery disease --Mild left ventricular dysfunction with EF of 40%  with distal anterior akinesis and periapical dyskinesis    -- Kaylyn Lim., M.D.    History  Smoking status  . Former Smoker -- 1.50 packs/day for 30 years  . Types: Cigarettes  . Quit date: 12/14/1978  Smokeless tobacco  . Not on file    History  Alcohol Use No    Family History  Problem Relation Age of Onset  . Heart attack Father 18    Review of Systems: As noted in history of present illness. All other systems were reviewed and are negative.  Physical Exam: BP 102/52  Pulse 60  Ht 5\' 10"  (1.778 m)  Wt 195 lb (88.451 kg)  BMI 27.98 kg/m2 He is a pleasant white male in no acute distress. His HEENT exam is unremarkable. He has no JVD or bruits. Lungs are clear. Cardiac exam reveals a regular rate and rhythm without gallop, murmur, or click. Abdomen is soft and nontender. He has no masses. Pedal pulses are 2+. He has  no edema. His neurologic exam is nonfocal.  LABORATORY DATA: Lab Results  Component Value Date   GLUCOSE 95 04/03/2013   CHOL 91 04/03/2013   TRIG 58.0 04/03/2013   HDL 34.40* 04/03/2013   LDLCALC 45 04/03/2013   ALT 26 04/03/2013   AST 33 04/03/2013   NA 137 04/03/2013   K 3.8 04/03/2013   CL 103 04/03/2013   CREATININE 1.0 04/03/2013   BUN 19 04/03/2013   CO2 28 04/03/2013   Ecg: NSR, old anteroseptal MI  Assessment / Plan: 1. Old anterior myocardial infarction with occlusion of the LAD. Patient is asymptomatic. Continue aspirin therapy and statin therapy. He is not a candidate for beta blockade because of bradycardia. Follow up in 6 months.  2. Hypercholesterolemia. On lipitor. Request most recent lab work from Dr. Mancel Bale.  3.  Chronic congestive heart failure with moderate left ventricular dysfunction. He is asymptomatic. Continue ACE inhibitor therapy.

## 2014-05-03 NOTE — Patient Instructions (Signed)
Continue your current therapy  I will see you in 6 months.   

## 2014-11-02 ENCOUNTER — Ambulatory Visit: Payer: Medicare Other | Admitting: Cardiology

## 2014-12-21 ENCOUNTER — Other Ambulatory Visit: Payer: Self-pay | Admitting: *Deleted

## 2014-12-21 ENCOUNTER — Other Ambulatory Visit: Payer: Self-pay

## 2014-12-21 MED ORDER — LISINOPRIL 2.5 MG PO TABS: 2.5000 mg | ORAL_TABLET | Freq: Every day | ORAL | Status: DC

## 2014-12-21 MED ORDER — LISINOPRIL 2.5 MG PO TABS: 2.5000 mg | ORAL_TABLET | Freq: Every day | ORAL | Status: AC

## 2014-12-21 MED ORDER — LISINOPRIL 2.5 MG PO TABS
2.5000 mg | ORAL_TABLET | Freq: Every day | ORAL | Status: DC
Start: 2014-12-21 — End: 2014-12-21

## 2014-12-21 MED ORDER — ATORVASTATIN CALCIUM 40 MG PO TABS: 20.0000 mg | ORAL_TABLET | Freq: Every day | ORAL | Status: DC

## 2014-12-29 ENCOUNTER — Ambulatory Visit (INDEPENDENT_AMBULATORY_CARE_PROVIDER_SITE_OTHER): Payer: 59

## 2014-12-29 ENCOUNTER — Ambulatory Visit (INDEPENDENT_AMBULATORY_CARE_PROVIDER_SITE_OTHER): Payer: Medicare Other | Admitting: Internal Medicine

## 2014-12-29 VITALS — BP 130/78 | HR 81 | Temp 97.7°F | Resp 18 | Ht 68.5 in | Wt 183.0 lb

## 2014-12-29 DIAGNOSIS — R490 Dysphonia: Secondary | ICD-10-CM

## 2014-12-29 DIAGNOSIS — R918 Other nonspecific abnormal finding of lung field: Secondary | ICD-10-CM

## 2014-12-29 DIAGNOSIS — R634 Abnormal weight loss: Secondary | ICD-10-CM

## 2014-12-29 LAB — BASIC METABOLIC PANEL
BUN: 13 mg/dL (ref 6–23)
CALCIUM: 9.5 mg/dL (ref 8.4–10.5)
CO2: 26 meq/L (ref 19–32)
Chloride: 104 mEq/L (ref 96–112)
Creat: 0.88 mg/dL (ref 0.50–1.35)
Glucose, Bld: 96 mg/dL (ref 70–99)
POTASSIUM: 4.1 meq/L (ref 3.5–5.3)
SODIUM: 138 meq/L (ref 135–145)

## 2014-12-29 LAB — POCT CBC
GRANULOCYTE PERCENT: 62.7 % (ref 37–80)
HEMATOCRIT: 43.1 % — AB (ref 43.5–53.7)
HEMOGLOBIN: 13.5 g/dL — AB (ref 14.1–18.1)
LYMPH, POC: 2 (ref 0.6–3.4)
MCH, POC: 27.6 pg (ref 27–31.2)
MCHC: 31.3 g/dL — AB (ref 31.8–35.4)
MCV: 88 fL (ref 80–97)
MID (cbc): 0.7 (ref 0–0.9)
MPV: 6.6 fL (ref 0–99.8)
PLATELET COUNT, POC: 293 10*3/uL (ref 142–424)
POC Granulocyte: 4.5 (ref 2–6.9)
POC LYMPH %: 28.2 % (ref 10–50)
POC MID %: 9.1 %M (ref 0–12)
RBC: 4.9 M/uL (ref 4.69–6.13)
RDW, POC: 16.5 %
WBC: 7.2 10*3/uL (ref 4.6–10.2)

## 2014-12-29 LAB — POCT SEDIMENTATION RATE: POCT SED RATE: 40 mm/hr — AB (ref 0–22)

## 2014-12-29 NOTE — Patient Instructions (Signed)
Hoarseness Hoarseness is produced from a variety of causes. It is important to find the cause so it can be treated. In the absence of a cold or upper respiratory illness, any hoarseness lasting more than 2 weeks should be looked at by a specialist. This is especially important if you have a history of smoking or alcohol use. It is also important to keep in mind that as you grow older, your voice will naturally get weaker, making it easier for you to become hoarse from straining your vocal cords.  CAUSES  Any illness that affects your vocal cords can result in a hoarse voice. Examples of conditions that can affect the vocal cords are listed as follows:   Allergies.  Colds.  Sinusitis.  Gastroesophageal reflux disease.  Croup.  Injury.  Nodules.  Exposure to smoke or toxic fumes or gases.  Congenital and genetic defects.  Paralysis of the vocal cords.  Infections.  Advanced age. DIAGNOSIS  In order to diagnose the cause of your hoarseness, your caregiver will examine your throat using an instrument that uses a tube with a small lighted camera (laryngoscope). It allows your caregiver to look into the mouth and down the throat. TREATMENT  For most cases, treatment will focus on the specific cause of the hoarseness. Depending on the cause, hoarseness can be a temporary condition (acute) or it can be long lasting (chronic). Most cases of hoarseness clear up without complications. Your caregiver will explain to you if this is not likely to happen. SEEK IMMEDIATE MEDICAL CARE IF:   You have increasing hoarseness or loss of voice.  You have shortness of breath.  You are coughing up blood.  There is pain in your neck or throat. Document Released: 11/13/2005 Document Revised: 02/22/2012 Document Reviewed: 02/05/2011 [facility] Patient Information 2015 [place], [place]. This information is not intended to replace advice given to you by your health care provider. Make sure you discuss any  questions you have with your health care provider.

## 2014-12-29 NOTE — Progress Notes (Signed)
   Subjective:    Patient ID: Luke Contreras, male    DOB: [birth year], 81 y.o.   MRN: [id number]  HPI Has had hoarseness for 5 weeks , 2 rounds of primary care treatments and antibiotics no affect. No associated sob,cp,hemoptysis,or fatigue. Does have dry cough with this. Has 12 lbs weight loss but on a diet and feels good. No anorexia, nite sweats. Does have stomach burning with keflex that was tried for this.   Review of Systems     Objective:   Physical Exam  Constitutional: He is oriented to person, place, and time. He appears well-developed and well-nourished. He is active and cooperative. He does not have a sickly appearance. He does not appear ill. No distress.  HENT:  Head: Normocephalic and atraumatic.  Right Ear: External ear normal.  Left Ear: External ear normal.  Nose: Mucosal edema and rhinorrhea present. No sinus tenderness. No epistaxis. Right sinus exhibits no maxillary sinus tenderness and no frontal sinus tenderness. Left sinus exhibits no maxillary sinus tenderness and no frontal sinus tenderness.  Mouth/Throat: Uvula is midline and oropharynx is clear and moist. No oropharyngeal exudate, posterior oropharyngeal edema, posterior oropharyngeal erythema or tonsillar abscesses.  Eyes: Conjunctivae and EOM are normal. Pupils are equal, round, and reactive to light.  Neck: Normal range of motion. Neck supple. No thyromegaly present.  Cardiovascular: Normal rate, regular rhythm and normal heart sounds.   Pulmonary/Chest: Effort normal and breath sounds normal. No stridor. He exhibits no tenderness.  Musculoskeletal: Normal range of motion.  Lymphadenopathy:    He has no cervical adenopathy.  Neurological: He is alert and oriented to person, place, and time. He exhibits normal muscle tone. Coordination normal.  Skin: He is not diaphoretic.  Psychiatric: He has a normal mood and affect.   [facility] reading (PRIMARY) by  Dr.Guest possible large left hilar mass , possible effusion  or mass on lateral. Neck has appears an obstruction of air pattern, get urgent read. Mass left chest probable bronchogenic carcinoma.  Results for orders placed or performed in visit on 12/29/14  POCT CBC  Result Value Ref Range   WBC 7.2 4.6 - 10.2 K/uL   Lymph, poc 2.0 0.6 - 3.4   POC LYMPH PERCENT 28.2 10 - 50 %L   MID (cbc) 0.7 0 - 0.9   POC MID % 9.1 0 - 12 %M   POC Granulocyte 4.5 2 - 6.9   Granulocyte percent 62.7 37 - 80 %G   RBC 4.90 4.69 - 6.13 M/uL   Hemoglobin 13.5 (A) 14.1 - 18.1 g/dL   HCT, POC 43.1 (A) 43.5 - 53.7 %   MCV 88.0 80 - 97 fL   MCH, POC 27.6 27 - 31.2 pg   MCHC 31.3 (A) 31.8 - 35.4 g/dL   RDW, POC 16.5 %   Platelet Count, POC 293 142 - 424 K/uL   MPV 6.6 0 - 99.8 fL          Assessment & Plan:  Hoarseness 5 weeks Left lung mass/Needs chest CT and take reports to Dr. Mancel Bale Schedule CT with contrast and take xr cd to your doctor

## 2015-01-01 ENCOUNTER — Telehealth: Payer: Self-pay

## 2015-01-01 ENCOUNTER — Other Ambulatory Visit: Payer: Self-pay | Admitting: Internal Medicine

## 2015-01-01 DIAGNOSIS — IMO0002 Reserved for concepts with insufficient information to code with codable children: Secondary | ICD-10-CM

## 2015-01-01 DIAGNOSIS — R229 Localized swelling, mass and lump, unspecified: Principal | ICD-10-CM

## 2015-01-01 NOTE — Telephone Encounter (Signed)
Pt wanted Dr.Guest to know he have an appt with Dr. Mancel Bale today and may need to have a cat scan done. Please call [phone] if needed

## 2015-01-02 ENCOUNTER — Ambulatory Visit
Admission: RE | Admit: 2015-01-02 | Discharge: 2015-01-02 | Disposition: A | Discharge status: Home / Self Care | Payer: Medicare Other | Source: Ambulatory Visit | Attending: Internal Medicine | Admitting: Internal Medicine

## 2015-01-02 DIAGNOSIS — IMO0002 Reserved for concepts with insufficient information to code with codable children: Secondary | ICD-10-CM

## 2015-01-02 DIAGNOSIS — R229 Localized swelling, mass and lump, unspecified: Principal | ICD-10-CM

## 2015-01-02 MED ORDER — IOHEXOL 300 MG/ML  SOLN: 75.0000 mL | Freq: Once | INTRAMUSCULAR | Status: AC | PRN

## 2015-01-04 ENCOUNTER — Other Ambulatory Visit: Payer: Medicare Other

## 2015-01-07 ENCOUNTER — Telehealth: Payer: Self-pay | Admitting: *Deleted

## 2015-01-07 ENCOUNTER — Encounter: Payer: Self-pay | Admitting: *Deleted

## 2015-01-07 DIAGNOSIS — K219 Gastro-esophageal reflux disease without esophagitis: Secondary | ICD-10-CM | POA: Insufficient documentation

## 2015-01-07 DIAGNOSIS — I714 Abdominal aortic aneurysm, without rupture, unspecified: Secondary | ICD-10-CM

## 2015-01-07 NOTE — Telephone Encounter (Signed)
Called left vm message to call with my name and phone number.

## 2015-01-07 NOTE — [facility] Oncology Navigator Note (Unsigned)
Received referral from Dr. Julien Nordmann on the above patient.  I called referring office to obtain medical notes.  Fax number given and they are aware of appt for patient this week at clinic.

## 2015-01-10 ENCOUNTER — Ambulatory Visit
Admission: RE | Admit: 2015-01-10 | Discharge: 2015-01-10 | Disposition: A | Discharge status: Home / Self Care | Payer: Medicare Other | Source: Ambulatory Visit | Attending: Radiation Oncology | Admitting: Radiation Oncology

## 2015-01-10 ENCOUNTER — Ambulatory Visit (HOSPITAL_BASED_OUTPATIENT_CLINIC_OR_DEPARTMENT_OTHER): Payer: Medicare Other | Admitting: Internal Medicine

## 2015-01-10 ENCOUNTER — Ambulatory Visit (INDEPENDENT_AMBULATORY_CARE_PROVIDER_SITE_OTHER): Payer: Medicare Other | Admitting: Cardiothoracic Surgery

## 2015-01-10 ENCOUNTER — Encounter: Payer: Self-pay | Admitting: Internal Medicine

## 2015-01-10 ENCOUNTER — Encounter: Payer: Self-pay | Admitting: *Deleted

## 2015-01-10 ENCOUNTER — Encounter: Payer: Self-pay | Admitting: Cardiothoracic Surgery

## 2015-01-10 ENCOUNTER — Telehealth: Payer: Self-pay | Admitting: Internal Medicine

## 2015-01-10 ENCOUNTER — Encounter (HOSPITAL_COMMUNITY): Payer: Self-pay | Admitting: *Deleted

## 2015-01-10 ENCOUNTER — Ambulatory Visit: Payer: Medicare Other | Attending: Internal Medicine | Admitting: Physical Therapy

## 2015-01-10 ENCOUNTER — Other Ambulatory Visit: Payer: Self-pay | Admitting: *Deleted

## 2015-01-10 VITALS — BP 119/56 | HR 95 | Temp 98.6°F | Resp 23 | Ht 68.0 in | Wt 182.9 lb

## 2015-01-10 VITALS — BP 119/56 | HR 95 | Temp 98.6°F | Resp 23 | Ht 68.5 in | Wt 182.9 lb

## 2015-01-10 DIAGNOSIS — Z9181 History of falling: Secondary | ICD-10-CM

## 2015-01-10 DIAGNOSIS — R296 Repeated falls: Secondary | ICD-10-CM | POA: Insufficient documentation

## 2015-01-10 DIAGNOSIS — R918 Other nonspecific abnormal finding of lung field: Secondary | ICD-10-CM

## 2015-01-10 NOTE — Progress Notes (Signed)
[address]       [place] [address]             [phone]                    [facility] Medical Record #[id number] Date of Birth: [birth year]  Referring: Curt Bears, MD Primary Care: Myriam Jacobson, MD  Chief Complaint: hoarseness  with lung mass   History of Present Illness:    Luke Contreras 81 y.o. male is seen in the office  today for history of 6 weeks of hoarseness and found to have a left lung mass. 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   Current Activity/ Functional Status:  Patient is independent with mobility/ambulation, transfers, ADL's, IADL's.   Zubrod Score: At the time of surgery this patient's most appropriate activity status/level should be described as: []     0    Normal activity, no symptoms [x]     1    Restricted in physical strenuous activity but ambulatory, able to do out light work []     2    Ambulatory and capable of self care, unable to do work activities, up and about               >50 % of waking hours                              []     3    Only limited self care, in bed greater than 50% of waking hours []     4    Completely disabled, no self care, confined to bed or chair []     5    Moribund   Past Medical History  Diagnosis Date  . History of acute anterior wall myocardial infarction 11/2006  . Atrial fibrillation   . Dyslipidemia   . Hypertension   . Coronary artery disease     history of  obstructive single-valve disease  . PAC (premature atrial contraction)   . Left ventricular dysfunction     withEF of 40-45%   . Cancer     skin cancer- side of nausea  . Complication of anesthesia   . Shortness of breath dyspnea     " when talking and completing a long sentence  . GERD (gastroesophageal reflux disease)   . Arthritis     hands, knees    Past Surgical History  Procedure Laterality  Date  . Cardiac catheterization  11/26/2006    Est. EF of 40% --  A 100% obstructive proximal/mid left anterior descending artery consistent with recent anterior myocardial infarction --  Moderate mid right coronary artery disease --Mild left ventricular dysfunction with EF of 40%  with distal anterior akinesis and periapical dyskinesis    -- Kaylyn Lim., M.D.  . Colonoscopy      Family History  Problem Relation Age of Onset  . Heart attack Father 75    History   Social History  . Marital Status: Married    Spouse Name: N/A    Number of Children: 1  . Years of Education: N/A   Occupational History  . retired    Social History Main Topics  . Smoking status: Former Smoker -- 1.50 packs/day for 30 years    Types:  Cigarettes    Quit date: 12/14/1978  . Smokeless tobacco: Never Used  . Alcohol Use: No  . Drug Use: No  . Sexual Activity: Not on file   Other Topics Concern  . Not on file   Social History Narrative    History  Smoking status  . Former Smoker -- 1.50 packs/day for 30 years  . Types: Cigarettes  . Quit date: 12/14/1978  Smokeless tobacco  . Never Used    History  Alcohol Use No     Allergies  Allergen Reactions  . Cephalexin Other (See Comments)    Stomach pain  . Aspirin     Bother stomach    Current Outpatient Prescriptions  Medication Sig Dispense Refill  . aspirin 325 MG EC tablet Take 325 mg by mouth daily.    Marland Kitchen atorvastatin (LIPITOR) 40 MG tablet Take 0.5 tablets (20 mg total) by mouth daily. 45 tablet 0  . clorazepate (TRANXENE) 7.5 MG tablet Take 7.5 mg by mouth at bedtime.      . Cyanocobalamin (VITAMIN B-12 PO) Take 500 mcg by mouth daily.     Marland Kitchen lisinopril (PRINIVIL,ZESTRIL) 2.5 MG tablet Take 1 tablet (2.5 mg total) by mouth daily. 90 tablet 0  . Misc Natural Products (OSTEO BI-FLEX JOINT SHIELD PO) Take 1 tablet by mouth daily.      . multivitamin (THERAGRAN) per tablet Take 1 tablet by mouth daily.      Marland Kitchen NITROSTAT 0.4 MG  SL tablet Place 0.4 mg under the tongue every 5 (five) minutes as needed for chest pain.     Marland Kitchen omeprazole (PRILOSEC) 20 MG capsule Take 20 mg by mouth daily.     Marland Kitchen alum & mag hydroxide-simeth (MAALOX/MYLANTA) 200-200-20 MG/5ML suspension Take by mouth every 6 (six) hours as needed for indigestion or heartburn.    . fluorouracil (EFUDEX) 5 % cream Apply 1 application topically daily.   0   No current facility-administered medications for this visit.       Review of Systems:     Cardiac Review of Systems: Y or N  Chest Pain [ n   ]  Resting SOB [ n  ] Exertional SOB  [ y ]  Orthopnea [ n ]   Pedal Edema [ n  ]    Palpitations [ n ] Syncope  [ n ]   Presyncope [ n  ]  General Review of Systems: [Y] = yes [  ]=no Constitional: recent weight change [y 12 lbs  ];  Wt loss over the last 3 months [   ] anorexia [  ]; fatigue [  ]; nausea [  ]; night sweats [  ]; fever [  ]; or chills [  ];          Dental: poor dentition[ n ]; Last Dentist visit:   Eye : blurred vision [  ]; diplopia [   ]; vision changes [  ];  Amaurosis fugax[ n ]; Resp: cough [ y ];  wheezing[n  ];  hemoptysis[ n ]; shortness of breath[ y ]; paroxysmal nocturnal dyspnea[ y ]; dyspnea on exertion[y  ]; or orthopnea[  ];  GI:  gallstones[  ], vomiting[ n ];  dysphagia[  ]; melena[ n ];  hematochezia [ n ]; heartburn[  ];   Hx of  Colonoscopy[  ]; GU: kidney stones [  ]; hematuria[  ];   dysuria [  ];  nocturia[  ];  history of  obstruction [  ]; urinary frequency [  ]             Skin: rash, swelling[  ];, hair loss[  ];  peripheral edema[  ];  or itching[  ]; Musculosketetal: myalgias[  ];  joint swelling[  ];  joint erythema[  ];  joint pain[  ];  back pain[  ];  Heme/Lymph: bruising[n  ];  bleeding[  ];  anemia[  ];  Neuro: TIA[  ];  headaches[  ];  stroke[  ];  vertigo[  ];  seizures[  ];   paresthesias[  ];  difficulty walking[  ];  Psych:depression[  ]; anxiety[  ];  Endocrine: diabetes[  ];  thyroid dysfunction[   ];  Immunizations: Flu up to date [ ? ]; Pneumococcal up to date [ ? ];  Other:  Physical Exam: BP 119/56 mmHg  Pulse 95  Temp(Src) 98.6 F (37 C)  Resp 23  Ht 5\' 8"  (4.128 m)  Wt 182 lb 14.4 oz (82.963 kg)  BMI 27.82 kg/m2  SpO2 96%  PHYSICAL EXAMINATION: General appearance: alert, cooperative and appears stated age Head: Normocephalic, without obvious abnormality, atraumatic Neck: no adenopathy, no carotid bruit, no JVD, supple, symmetrical, trachea midline and thyroid not enlarged, symmetric, no tenderness/mass/nodules Lymph nodes: Cervical, supraclavicular, and axillary nodes normal. Resp: diminished breath sounds RUL Back: symmetric, no curvature. ROM normal. No CVA tenderness. Cardio: regular rate and rhythm, S1, S2 normal, no murmur, click, rub or gallop GI: soft, non-tender; bowel sounds normal; no masses,  no organomegaly Extremities: extremities normal, atraumatic, no cyanosis or edema and Homans sign is negative, no sign of DVT Neurologic: Alert and oriented X 3, normal strength and tone. Normal symmetric reflexes. Normal coordination and gait  Hoarseness with voice fatigue as he talks  Diagnostic Studies & Laboratory data:     Recent Radiology Findings:   Dg Neck Soft Tissue  12/29/2014   CLINICAL DATA:  Hoarseness.  EXAM: NECK SOFT TISSUES - 1+ VIEW  COMPARISON:  Chest radiographs obtained at the same time and on 11/27/2006.  FINDINGS: The soft tissues of the neck have normal appearances. Cervical spine degenerative changes are noted. Increased in scratch increased density in the left hemithorax will be discussed on the chest radiographs report.  IMPRESSION: 1. And normal appearing neck soft tissues and airway. 2. Cervical spine degenerative changes. 3. Increased density in the left hemothorax, discussed in the chest radiographs report.   Electronically Signed   By: Enrique Sack M.D.   On: 12/29/2014 11:28   Dg Chest 2 View  12/29/2014   CLINICAL DATA:  Hoarseness.   Shortness of breath.  EXAM: CHEST  2 VIEW  COMPARISON:  11/27/2006  FINDINGS: New left hilar mass is seen with postobstructive atelectasis involving the left upper lobe. No evidence of pleural effusion. Right lung remains clear. Heart size is normal.  IMPRESSION: New left hilar mass with postobstructive left upper lobe atelectasis. This is highly suspicious for bronchogenic carcinoma. Chest CT with contrast recommended for further evaluation.  These results will be called to the ordering clinician or representative by the Radiologist Assistant, and communication documented in the PACS or zVision Dashboard.   Electronically Signed   By: Earle Gell M.D.   On: 12/29/2014 11:28   Ct Chest W Contrast  01/02/2015   CLINICAL DATA:  Left hilar mass. Occasional productive cough for 5 weeks. Shortness of breath.  EXAM: CT CHEST WITH CONTRAST  TECHNIQUE: Multidetector CT imaging of the chest  was performed during intravenous contrast administration.  CONTRAST:  75 cc Omnipaque 300 IV.  COMPARISON:  Chest x-ray 12/29/2014.  FINDINGS: There is a large necrotic-appearing left hilar and suprahilar mass with associated left upper lobe collapse. It is difficult to measure due to the collapsed left upper lobe. Central left scratch has central low-density area measures approximately 4.5 x 2.8 cm on image 27 anteriorly in the left hilum/suprahilar region. The left upper lobe bronchus occludes proximally.  Mild to moderate centrilobular emphysema. No confluent opacity or nodule on the right. No pleural effusions.  Calcified left hilar and mediastinal lymph nodes compatible with old granulomatous disease. No mediastinal, hilar or axillary adenopathy. Heart is normal size. Scattered coronary artery calcifications. Aorta is slightly dilated, 4 cm in the ascending thoracic aorta.  No acute bony abnormality or focal bone lesion. Imaging into the upper abdomen shows no acute findings. Small low-density lesions centrally within the spleen  measuring 12 mm, most likely cyst or hemangioma.  IMPRESSION: Occlusion of the left upper lobe bronchus with necrotic appearing left hilar/ suprahilar mass and complete collapse of the left upper lobe. 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  Coronary artery disease.  Mild to moderate emphysema.   Electronically Signed   By: Rolm Baptise M.D.   On: 01/02/2015 09:54     I have independently reviewed the above radiologic studies. With elevated diaphragm also  Recent Lab Findings: Lab Results  Component Value Date   WBC 7.2 12/29/2014   HGB 13.5* 12/29/2014   HCT 43.1* 12/29/2014   GLUCOSE 96 12/29/2014   CHOL 91 04/03/2013   TRIG 58.0 04/03/2013   HDL 34.40* 04/03/2013   LDLCALC 45 04/03/2013   ALT 26 04/03/2013   AST 33 04/03/2013   NA 138 12/29/2014   K 4.1 12/29/2014   CL 104 12/29/2014   CREATININE 0.88 12/29/2014   BUN 13 12/29/2014   CO2 26 12/29/2014      Assessment / Plan:   Likely,  at least Stage IIIA (cT4) carcinoma with left hilar mass with recurrent and phrenic nerve involvement. I have discussed radiographic findings and likely dx of of carcinoma of the lung. I have recommended to the patient we proceed with bronchoscopy and ebus  to obtain tissue dx. Risks and options have been discussed        I  spent 40 minutes counseling the patient face to face and 50% or more the  time was spent in counseling and coordination of care. The total time spent in the appointment was 60 minutes.  Grace Isaac MD      [address] [place] [address] Office [phone]   Beeper [phone]  01/10/2015 9:09 PM

## 2015-01-10 NOTE — Progress Notes (Signed)
[facility] Clinical Social Work  Clinical Social Work met with patient/family at [facility] appointment to offer support and assess for psychosocial needs.  Luke Contreras was accompanied by his son and spouse.  He reported no concerns at this time.  Clinical Social Work briefly discussed Clinical Social Work role and [facility] support programs/services.  Clinical Social Work encouraged patient to call with any additional questions or concerns.   Lauren Mullis, MSW, LCSW, OSW-C Clinical Social Worker [facility] [phone]  

## 2015-01-10 NOTE — Progress Notes (Signed)
Radiation Oncology         [phone] ________________________________  Multidisciplinary Thoracic Oncology Clinic [facility]) Initial Outpatient Consultation  Name: Luke Contreras MRN: [id number]  Date: 01/10/2015  DOB: 1930-08-23  RD:EYCXKGY, Sharol Given, MD  Lorene Dy, MD   REFERRING PHYSICIAN: Lorene Dy, MD  DIAGNOSIS: 81 yo man with left upper lung mass, pending biopsy    ICD-9-CM ICD-10-CM   1. Mass of lung 786.6 R91.8     HISTORY OF PRESENT ILLNESS::Luke Contreras is a 81 y.o. male who presented with hoarseness for 5 weeks and 2 rounds of primary care antibiotics with no improvement.  Chest X-Ray showed a left hilar mass and possible collapse of the left upper lung:    Chest CT showed occlusion of the left upper lobe bronchus with necrotic appearing 4.5 x 2.8 cm left hilar/ suprahilar mass and complete collapse of the left upper lobe. 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    This is suspected to likely lung cancer so the patient was referred to the [facility] clinic.  PREVIOUS RADIATION THERAPY: No  PAST MEDICAL HISTORY:  has a past medical history of History of acute anterior wall myocardial infarction (11/2006); Atrial fibrillation; Dyslipidemia; Hypertension; Coronary artery disease; PAC (premature atrial contraction); and Left ventricular dysfunction.    PAST SURGICAL HISTORY: Past Surgical History  Procedure Laterality Date  . Cardiac catheterization  11/26/2006    Est. EF of 40% --  A 100% obstructive proximal/mid left anterior descending artery consistent with recent anterior myocardial infarction --  Moderate mid right coronary artery disease --Mild left ventricular dysfunction with EF of 40%  with distal anterior akinesis and periapical dyskinesis    -- Kaylyn Lim., M.D.    FAMILY HISTORY: family history includes Heart attack (age of onset: 12) in his father.  SOCIAL HISTORY:  reports that he quit smoking about 36 years ago. His  smoking use included Cigarettes. He has a 45 pack-year smoking history. He has never used smokeless tobacco. He reports that he does not drink alcohol or use illicit drugs.  ALLERGIES: Cephalexin and Aspirin  MEDICATIONS:  Current Outpatient Prescriptions  Medication Sig Dispense Refill  . aspirin 325 MG EC tablet Take 325 mg by mouth daily.    Marland Kitchen atorvastatin (LIPITOR) 40 MG tablet Take 0.5 tablets (20 mg total) by mouth daily. 45 tablet 0  . clorazepate (TRANXENE) 7.5 MG tablet Take 7.5 mg by mouth at bedtime.      . Cyanocobalamin (VITAMIN B-12 PO) Take 500 mcg by mouth daily.     . fluorouracil (EFUDEX) 5 % cream Apply 1 application topically daily.   0  . lisinopril (PRINIVIL,ZESTRIL) 2.5 MG tablet Take 1 tablet (2.5 mg total) by mouth daily. 90 tablet 0  . Misc Natural Products (OSTEO BI-FLEX JOINT SHIELD PO) Take 1 tablet by mouth daily.      . multivitamin (THERAGRAN) per tablet Take 1 tablet by mouth daily.      Marland Kitchen NITROSTAT 0.4 MG SL tablet Place 0.4 mg under the tongue every 5 (five) minutes as needed for chest pain.     Marland Kitchen omeprazole (PRILOSEC) 20 MG capsule Take 20 mg by mouth daily.      No current facility-administered medications for this encounter.    REVIEW OF SYSTEMS:  A 15 point review of systems is documented in the electronic medical record. This was obtained by the nursing staff. However, I reviewed this with the patient to discuss relevant findings and make  appropriate changes.  A comprehensive review of systems was negative.   PHYSICAL EXAM:  height is $RemoveB'5\' 8"'FXbPeSrF$  (1.727 m) and weight is 182 lb 14.4 oz (82.963 kg). His temperature is 98.6 F (37 C). His blood pressure is 119/56 and his pulse is 95. His respiration is 23 and oxygen saturation is 96%.   Per   Dr. Elder Cyphers, Constitutional: He is oriented to person, place, and time. He appears well-developed and well-nourished. He is active and cooperative. He does not have a sickly appearance. He does not appear ill. No distress.   HENT: Head: Normocephalic and atraumatic. Right Ear: External ear normal. Left Ear: External ear normal. Nose: Mucosal edema and rhinorrhea present. No sinus tenderness. No epistaxis. Right sinus exhibits no maxillary sinus tenderness and no frontal sinus tenderness. Left sinus exhibits no maxillary sinus tenderness and no frontal sinus tenderness. Mouth/Throat: Uvula is midline and oropharynx is clear and moist. No oropharyngeal exudate, posterior oropharyngeal edema, posterior oropharyngeal erythema or tonsillar abscesses. Eyes: Conjunctivae and EOM are normal. Pupils are equal, round, and reactive to light. Neck: Normal range of motion. Neck supple. No thyromegaly present. Cardiovascular: Normal rate, regular rhythm and normal heart sounds. Pulmonary/Chest: Effort normal and breath sounds normal. No stridor. He exhibits no tenderness. Musculoskeletal: Normal range of motion. Lymphadenopathy:  He has no cervical adenopathy. Neurological: He is alert and oriented to person, place, and time. He exhibits normal muscle tone. Coordination normal. Skin: He is not diaphoretic. Psychiatric: He has a normal mood and affect.   KPS = 80  100 - Normal; no complaints; no evidence of disease. 90   - Able to carry on normal activity; minor signs or symptoms of disease. 80   - Normal activity with effort; some signs or symptoms of disease. 20   - Cares for self; unable to carry on normal activity or to do active work. 60   - Requires occasional assistance, but is able to care for most of his personal needs. 50   - Requires considerable assistance and frequent medical care. 49   - Disabled; requires special care and assistance. 8   - Severely disabled; hospital admission is indicated although death not imminent. 78   - Very sick; hospital admission necessary; active supportive treatment necessary. 10   - Moribund; fatal processes progressing rapidly. 0     - Dead  Karnofsky DA, Abelmann [place], Craver LS and  Burchenal JH [phone]) The use of the nitrogen mustards in the palliative treatment of carcinoma: with particular reference to bronchogenic carcinoma Cancer 1 634-56  LABORATORY DATA:  Lab Results  Component Value Date   WBC 7.2 12/29/2014   HGB 13.5* 12/29/2014   HCT 43.1* 12/29/2014   MCV 88.0 12/29/2014   Lab Results  Component Value Date   NA 138 12/29/2014   K 4.1 12/29/2014   CL 104 12/29/2014   CO2 26 12/29/2014   Lab Results  Component Value Date   ALT 26 04/03/2013   AST 33 04/03/2013   ALKPHOS 84 04/03/2013   BILITOT 1.2 04/03/2013    PULMONARY FUNCTION TEST:  Not performed as yet   RADIOGRAPHY: Dg Neck Soft Tissue  12/29/2014   CLINICAL DATA:  Hoarseness.  EXAM: NECK SOFT TISSUES - 1+ VIEW  COMPARISON:  Chest radiographs obtained at the same time and on 11/27/2006.  FINDINGS: The soft tissues of the neck have normal appearances. Cervical spine degenerative changes are noted. Increased in scratch increased density in the left hemithorax will be discussed on  the chest radiographs report.  IMPRESSION: 1. And normal appearing neck soft tissues and airway. 2. Cervical spine degenerative changes. 3. Increased density in the left hemothorax, discussed in the chest radiographs report.   Electronically Signed   By: Enrique Sack M.D.   On: 12/29/2014 11:28   Dg Chest 2 View  12/29/2014   CLINICAL DATA:  Hoarseness.  Shortness of breath.  EXAM: CHEST  2 VIEW  COMPARISON:  11/27/2006  FINDINGS: New left hilar mass is seen with postobstructive atelectasis involving the left upper lobe. No evidence of pleural effusion. Right lung remains clear. Heart size is normal.  IMPRESSION: New left hilar mass with postobstructive left upper lobe atelectasis. This is highly suspicious for bronchogenic carcinoma. Chest CT with contrast recommended for further evaluation.  These results will be called to the ordering clinician or representative by the Radiologist Assistant, and communication documented in  the PACS or zVision Dashboard.   Electronically Signed   By: Earle Gell M.D.   On: 12/29/2014 11:28   Ct Chest W Contrast  01/02/2015   CLINICAL DATA:  Left hilar mass. Occasional productive cough for 5 weeks. Shortness of breath.  EXAM: CT CHEST WITH CONTRAST  TECHNIQUE: Multidetector CT imaging of the chest was performed during intravenous contrast administration.  CONTRAST:  75 cc Omnipaque 300 IV.  COMPARISON:  Chest x-ray 12/29/2014.  FINDINGS: There is a large necrotic-appearing left hilar and suprahilar mass with associated left upper lobe collapse. It is difficult to measure due to the collapsed left upper lobe. Central left scratch has central low-density area measures approximately 4.5 x 2.8 cm on image 27 anteriorly in the left hilum/suprahilar region. The left upper lobe bronchus occludes proximally.  Mild to moderate centrilobular emphysema. No confluent opacity or nodule on the right. No pleural effusions.  Calcified left hilar and mediastinal lymph nodes compatible with old granulomatous disease. No mediastinal, hilar or axillary adenopathy. Heart is normal size. Scattered coronary artery calcifications. Aorta is slightly dilated, 4 cm in the ascending thoracic aorta.  No acute bony abnormality or focal bone lesion. Imaging into the upper abdomen shows no acute findings. Small low-density lesions centrally within the spleen measuring 12 mm, most likely cyst or hemangioma.  IMPRESSION: Occlusion of the left upper lobe bronchus with necrotic appearing left hilar/ suprahilar mass and complete collapse of the left upper lobe. 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  Coronary artery disease.  Mild to moderate emphysema.   Electronically Signed   By: Rolm Baptise M.D.   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 The radiographic findings are suggestive of a possible primary lung cancer. The patient would potentially benefit from bronchoscopy for biopsy and further management. Given the high likelihood that this represents malignancy, the patient treatment following diagnosis will very likely include a course of thoracic radiotherapy since he is not an ideal surgical candidate.  PLAN: Today, I took this initial opportunity to discuss with the patient and his family the findings workup thus far. I talked to him about the potential for a left mediastinal mass to cause hoarseness. We talked about the high likelihood that the radioactive findings represent a malignant process. We spent time talking about the role of radiation treatment in the management of locally advanced non-small cell and small cell lung cancer. We  discussed the logistics and delivery radiation treatment as well as anticipated acute and late sequelae. We talked about a variety of potential fractionation schemes which could be employed either with or without concurrent chemotherapy depending on his overall medical condition. The patient and his family had several questions which I answered to the best of my ability. In addition to my visit today, patient has met with medical oncology as well as thoracic surgery. The next step in his care will be to attempt to obtain tissue diagnosis. I will look forward to following his progress and become more involved in his care if clinically indicated.   I spent 30 minutes minutes face to face with the patient and more than 50% of that time was spent in counseling and/or coordination of care.   ------------------------------------------------  Sheral Apley. Tammi Klippel, M.D.

## 2015-01-10 NOTE — Telephone Encounter (Signed)
pt/family aware central scheduling will call re pet/mri. message to MM re f/u appt. pt/family aware I will call re appt for MM.

## 2015-01-10 NOTE — Therapy (Signed)
[place], [place], [address] Phone: [phone]   Fax:  [phone]  Physical Therapy Evaluation  Patient Details  Name: Luke Contreras MRN: [id number] Date of Birth: 04-19-1930 Referring Provider:  Curt Bears, MD  Encounter Date: 01/10/2015      PT End of Session - 01/10/15 1633    Visit Number 1   Number of Visits 1   Date for PT Re-Evaluation 03/10/15   PT Start Time 9702   PT Stop Time 1610   PT Time Calculation (min) 20 min      Past Medical History  Diagnosis Date  . History of acute anterior wall myocardial infarction 11/2006  . Atrial fibrillation   . Dyslipidemia   . Hypertension   . Coronary artery disease     history of  obstructive single-valve disease  . PAC (premature atrial contraction)   . Left ventricular dysfunction     withEF of 40-45%     Past Surgical History  Procedure Laterality Date  . Cardiac catheterization  11/26/2006    Est. EF of 40% --  A 100% obstructive proximal/mid left anterior descending artery consistent with recent anterior myocardial infarction --  Moderate mid right coronary artery disease --Mild left ventricular dysfunction with EF of 40%  with distal anterior akinesis and periapical dyskinesis    -- Kaylyn Lim., M.D.    There were no vitals taken for this visit.  Visit Diagnosis:  Personal history of falling, presenting hazards to health - Plan: PT plan of care cert/re-cert      Subjective Assessment - 01/10/15 1622    Symptoms Pt. presented with c/o hoarseness x 5 weeks; had two rounds of antibiotics with no improvement.   Pertinent History CT chest showed right hilar mass and left lung mass; no pathology obtained yet.  Pt. will need biopsy and PET scan; likely to have chemoradiation.  Ex-smoker who quit 1980 (30 pack-years); MI treated medically in 2007.  Pt. reports he does no regular exercise currently.   Currently in Pain? Other  (Comment)  First says no pain, but then describes episodic "ulcer-feeling" stomach pain with lying down          [facility] PT Assessment - 01/10/15 0001    Assessment   Medical Diagnosis lung mass with no pathology yet   Precautions   Precautions Other (comment);Cervical  cancer precautions   Restrictions   Weight Bearing Restrictions No   Balance Screen   Has the patient fallen in the past 6 months Yes   How many times? once; says he tripped when getting up and turning from recliner   Has the patient had a decrease in activity level because of a fear of falling?  No   Is the patient reluctant to leave their home because of a fear of falling?  No   Home Environment   Living Enviornment Private residence   Living Arrangements Spouse/significant other   Type of [place] One level   Prior Function   Level of Independence Independent with basic ADLs;Independent with homemaking with ambulation;Independent with gait   Leisure [place] acres; likes to work on [employer], [employer] Not tested  denies numbness or tingling in feet   Posture/Postural Control   Posture/Postural Control Postural limitations   Postural Limitations Forward head   AROM   Overall AROM Comments --  standing trunk AROM [facility]   Strength  Overall Strength Within functional limits for tasks performed   Overall Strength Comments gross LE strength testing done   Ambulation/Gait   Ambulation/Gait Yes   Ambulation/Gait Assistance 7: Independent   Balance   Balance Assessed Yes   Dynamic Standing Balance   Dynamic Standing - Comments --  reaches 9 inches forward in standing                          PT Education - 2015-01-25 1632    Education provided Yes   Education Details posture, breathing, walking, energy conservation   Person(s) Educated Patient;Spouse;Child(ren)   Methods Explanation;Handout   Comprehension Verbalized understanding               Lung  Clinic Goals - 2015/01/25 1635    Patient will be able to verbalize understanding of the benefit of exercise to decrease fatigue.   Status Achieved   Patient will be able to verbalize the importance of posture.   Status Achieved   Patient will be able to demonstrate diaphragmatic breathing for improved lung function.   Status Achieved   Patient will be able to verbalize understanding of the role of physical therapy to prevent functional decline and who to contact if physical therapy is needed.   Status Achieved             Plan - 01/25/15 1633    Clinical Impression Statement Pt. currently with reported good functional mobility but does have a h/o one fall; no regular exercise.   Pt will benefit from skilled therapeutic intervention in order to improve on the following deficits Decreased knowledge of precautions   Rehab Potential Fair   PT Frequency One time visit   PT Treatment/Interventions Patient/family education   PT Next Visit Plan None at this time   Consulted and Agree with Plan of Care Patient          G-Codes - 2015/01/25 1635    Functional Assessment Tool Used clinical judgement   Functional Limitation Mobility: Walking and moving around   Mobility: Walking and Moving Around Current Status [phone]) At least 1 percent but less than 20 percent impaired, limited or restricted   Mobility: Walking and Moving Around Goal Status [phone]) At least 1 percent but less than 20 percent impaired, limited or restricted   Mobility: Walking and Moving Around Discharge Status [phone]) At least 1 percent but less than 20 percent impaired, limited or restricted       Problem List Patient Active Problem List   Diagnosis Date Noted  . Mass of lung Jan 25, 2015  . GERD (gastroesophageal reflux disease) 01/07/2015  . AAA (abdominal aortic aneurysm) 01/07/2015  . Dyslipidemia   . Hypertension   . Coronary artery disease   . PAC (premature atrial contraction)   . Left ventricular  dysfunction   . History of acute anterior wall myocardial infarction 11/13/2006    Hina Gupta 01/25/15, 4:44 PM  [place], [place], [address] Phone: [phone]   Fax:  [phone]  Serafina Royals, [place]

## 2015-01-10 NOTE — Progress Notes (Signed)
Luke [phone]   [phone] Multidisciplinary thoracic oncology clinic  CONSULT NOTE  REFERRING PHYSICIAN: Dr. Lorene Dy  REASON FOR CONSULTATION:  81 years old white male with questionable lung cancer.  HPI Luke Contreras is a 82 y.o. male with past medical history significant for hypertension, coronary artery disease status post myocardial infarction 8 years ago, GERD, abdominal aortic aneurysm as well as dyslipidemia and left ventricular dysfunction. The patient has been complaining of cold symptoms and hoarseness of his voice is started few weeks ago. There was also associated with lack of energy. He was seen by his primary care physician and chest x-ray was performed on 12/29/2014 and it showed new left hilar mass with possible postobstructive left upper lobe atelectasis highly suspicious for bronchogenic carcinoma. This was followed by CT scan of the chest with contrast on 01/02/2015 and it showed a large necrotic-appearing left hilar and suprahilar mass with associated left upper lobe collapse. It is difficult to measure due to the collapsed left upper lobe. Central left scratch has central low-density area measures approximately 4.5 x 2.8 cm on anteriorly in the left hilum/suprahilar region. The left upper lobe bronchus occludes proximally. These findings are concerning for central bronchogenic carcinoma. Dr. Mancel Bale kindly referred the patient to me today for evaluation and recommendation regarding these abnormalities. When seen today the patient continues to complain of worsening of his voice as well as shortness breath with exertion. He also lost around 12 pounds over the last few weeks secondary to diarrhea which currently resolved. The patient denied having any significant chest pain, cough or hemoptysis. He denied having any significant nausea or vomiting. He denied having any headache or visual changes. Family history significant for a  father with congestive heart failure and mother died at age 14 with natural causes. The patient is married and has one son. He was accompanied today by his wife Nyoka Cowden and his son Kasandra Knudsen. He is to work in the [employer]. He has a history of smoking for around 30 years but quit on 02/22/1980. He has no history of alcohol or drug abuse.  HPI  Past Medical History  Diagnosis Date  . History of acute anterior wall myocardial infarction 11/2006  . Atrial fibrillation   . Dyslipidemia   . Hypertension   . Coronary artery disease     history of  obstructive single-valve disease  . PAC (premature atrial contraction)   . Left ventricular dysfunction     withEF of 40-45%     Past Surgical History  Procedure Laterality Date  . Cardiac catheterization  11/26/2006    Est. EF of 40% --  A 100% obstructive proximal/mid left anterior descending artery consistent with recent anterior myocardial infarction --  Moderate mid right coronary artery disease --Mild left ventricular dysfunction with EF of 40%  with distal anterior akinesis and periapical dyskinesis    -- Kaylyn Lim., M.D.    Family History  Problem Relation Age of Onset  . Heart attack Father 71    Social History History  Substance Use Topics  . Smoking status: Former Smoker -- 1.50 packs/day for 30 years    Types: Cigarettes    Quit date: 12/14/1978  . Smokeless tobacco: Never Used  . Alcohol Use: No    Allergies  Allergen Reactions  . Cephalexin Other (See Comments)    Stomach pain    Current Outpatient Prescriptions  Medication Sig Dispense Refill  . aspirin 325 MG  EC tablet Take 325 mg by mouth daily.    Marland Kitchen atorvastatin (LIPITOR) 40 MG tablet Take 0.5 tablets (20 mg total) by mouth daily. 45 tablet 0  . clorazepate (TRANXENE) 7.5 MG tablet Take 7.5 mg by mouth at bedtime.      . Cyanocobalamin (VITAMIN B-12 PO) Take by mouth daily.      Marland Kitchen lisinopril (PRINIVIL,ZESTRIL) 2.5 MG tablet Take 1 tablet (2.5 mg  total) by mouth daily. 90 tablet 0  . Misc Natural Products (OSTEO BI-FLEX JOINT SHIELD PO) Take 1 tablet by mouth daily.      . multivitamin (THERAGRAN) per tablet Take 1 tablet by mouth daily.      Marland Kitchen omeprazole (PRILOSEC) 20 MG capsule Take 20 mg by mouth daily.     . fluorouracil (EFUDEX) 5 % cream   0  . NITROSTAT 0.4 MG SL tablet      No current facility-administered medications for this visit.    Review of Systems  Constitutional: positive for fatigue and weight loss Eyes: negative Ears, nose, mouth, throat, and face: positive for hoarseness Respiratory: positive for dyspnea on exertion Cardiovascular: negative Gastrointestinal: negative Genitourinary:negative Integument/breast: negative Hematologic/lymphatic: negative Musculoskeletal:negative Neurological: negative Behavioral/Psych: negative Endocrine: negative Allergic/Immunologic: negative  Physical Exam  GGE:ZMOQH, healthy, no distress, well nourished and well developed SKIN: skin color, texture, turgor are normal, no rashes or significant lesions HEAD: Normocephalic, No masses, lesions, tenderness or abnormalities EYES: normal, PERRLA EARS: External ears normal, Canals clear OROPHARYNX:no exudate, no erythema and lips, buccal mucosa, and tongue normal  NECK: supple, no adenopathy, no JVD LYMPH:  no palpable lymphadenopathy, no hepatosplenomegaly LUNGS: clear to auscultation , and palpation HEART: regular rate & rhythm and no murmurs ABDOMEN:abdomen soft, non-tender, normal bowel sounds and no masses or organomegaly BACK: Back symmetric, no curvature., No CVA tenderness EXTREMITIES:no joint deformities, effusion, or inflammation, no edema, no skin discoloration  NEURO: alert & oriented x 3 with fluent speech, no focal motor/sensory deficits  PERFORMANCE STATUS: ECOG 1  LABORATORY DATA: Lab Results  Component Value Date   WBC 7.2 12/29/2014   HGB 13.5* 12/29/2014   HCT 43.1* 12/29/2014   MCV 88.0  12/29/2014      Chemistry      Component Value Date/Time   NA 138 12/29/2014 1207   K 4.1 12/29/2014 1207   CL 104 12/29/2014 1207   CO2 26 12/29/2014 1207   BUN 13 12/29/2014 1207   CREATININE 0.88 12/29/2014 1207   CREATININE 1.0 04/03/2013 0858      Component Value Date/Time   CALCIUM 9.5 12/29/2014 1207   ALKPHOS 84 04/03/2013 0858   AST 33 04/03/2013 0858   ALT 26 04/03/2013 0858   BILITOT 1.2 04/03/2013 0858       RADIOGRAPHIC STUDIES: Dg Neck Soft Tissue  12/29/2014   CLINICAL DATA:  Hoarseness.  EXAM: NECK SOFT TISSUES - 1+ VIEW  COMPARISON:  Chest radiographs obtained at the same time and on 11/27/2006.  FINDINGS: The soft tissues of the neck have normal appearances. Cervical spine degenerative changes are noted. Increased in scratch increased density in the left hemithorax will be discussed on the chest radiographs report.  IMPRESSION: 1. And normal appearing neck soft tissues and airway. 2. Cervical spine degenerative changes. 3. Increased density in the left hemothorax, discussed in the chest radiographs report.   Electronically Signed   By: Enrique Sack M.D.   On: 12/29/2014 11:28   Dg Chest 2 View  12/29/2014   CLINICAL DATA:  Hoarseness.  Shortness of breath.  EXAM: CHEST  2 VIEW  COMPARISON:  11/27/2006  FINDINGS: New left hilar mass is seen with postobstructive atelectasis involving the left upper lobe. No evidence of pleural effusion. Right lung remains clear. Heart size is normal.  IMPRESSION: New left hilar mass with postobstructive left upper lobe atelectasis. This is highly suspicious for bronchogenic carcinoma. Chest CT with contrast recommended for further evaluation.  These results will be called to the ordering clinician or representative by the Radiologist Assistant, and communication documented in the PACS or zVision Dashboard.   Electronically Signed   By: Earle Gell M.D.   On: 12/29/2014 11:28   Ct Chest W Contrast  01/02/2015   CLINICAL DATA:  Left hilar  mass. Occasional productive cough for 5 weeks. Shortness of breath.  EXAM: CT CHEST WITH CONTRAST  TECHNIQUE: Multidetector CT imaging of the chest was performed during intravenous contrast administration.  CONTRAST:  75 cc Omnipaque 300 IV.  COMPARISON:  Chest x-ray 12/29/2014.  FINDINGS: There is a large necrotic-appearing left hilar and suprahilar mass with associated left upper lobe collapse. It is difficult to measure due to the collapsed left upper lobe. Central left scratch has central low-density area measures approximately 4.5 x 2.8 cm on image 27 anteriorly in the left hilum/suprahilar region. The left upper lobe bronchus occludes proximally.  Mild to moderate centrilobular emphysema. No confluent opacity or nodule on the right. No pleural effusions.  Calcified left hilar and mediastinal lymph nodes compatible with old granulomatous disease. No mediastinal, hilar or axillary adenopathy. Heart is normal size. Scattered coronary artery calcifications. Aorta is slightly dilated, 4 cm in the ascending thoracic aorta.  No acute bony abnormality or focal bone lesion. Imaging into the upper abdomen shows no acute findings. Small low-density lesions centrally within the spleen measuring 12 mm, most likely cyst or hemangioma.  IMPRESSION: Occlusion of the left upper lobe bronchus with necrotic appearing left hilar/ suprahilar mass and complete collapse of the left upper lobe. 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  Coronary artery disease.  Mild to moderate emphysema.   Electronically Signed   By: Rolm Baptise M.D.   On: 01/02/2015 09:54    ASSESSMENT: This is a very pleasant 81 years old white male presented with questionable locally advanced lung cancer presenting with left suprahilar mass with associated left upper lobe collapse.   PLAN: I had a lengthy discussion with the patient and his family today about his current disease status and further investigation to complete the staging  workup and core from by diagnosis. I will arrange for the patient to have a PET scan as well as MRI of the brain to complete the staging workup. I will also arrange for the patient to see Dr. Servando Snare later today for evaluation and consideration of bronchoscopy with endobronchial ultrasound for tissue diagnosis. The patient would also be seen by Dr. Tammi Klippel for consideration of radiotherapy to the central obstructing mass. I will arrange for the patient to come back for follow-up visit in around 10 days or less for discussion of his treatment options based on the final staging workup and tissue diagnosis. The patient was seen during the multidisciplinary thoracic oncology clinic today by medical oncology, radiation oncology, thoracic surgery, thoracic navigator, social worker, physical therapist as well as oncology pharmacist. He was advised to call immediately if he has any concerning symptoms in the interval.  The patient voices understanding of current disease status and treatment options and is in agreement with the  current care plan.  All questions were answered. The patient knows to call the clinic with any problems, questions or concerns. We can certainly see the patient much sooner if necessary.  Thank you so much for allowing me to participate in the care of Luke Contreras. I will continue to follow up the patient with you and assist in his care.  I spent 40 minutes counseling the patient face to face. The total time spent in the appointment was 60 minutes.  Disclaimer: This note was dictated with voice recognition software. Similar sounding words can inadvertently be transcribed and may not be corrected upon review.   Laurajean Hosek K. 01/10/2015, 3:44 PM

## 2015-01-11 ENCOUNTER — Ambulatory Visit (HOSPITAL_COMMUNITY): Payer: Medicare Other

## 2015-01-11 ENCOUNTER — Encounter (HOSPITAL_COMMUNITY): Payer: Self-pay | Admitting: Anesthesiology

## 2015-01-11 ENCOUNTER — Ambulatory Visit (HOSPITAL_COMMUNITY): Payer: Medicare Other | Admitting: Anesthesiology

## 2015-01-11 ENCOUNTER — Other Ambulatory Visit: Payer: Self-pay | Admitting: *Deleted

## 2015-01-11 ENCOUNTER — Ambulatory Visit (HOSPITAL_COMMUNITY)
Admission: RE | Admit: 2015-01-11 | Discharge: 2015-01-11 | Disposition: A | Discharge status: Home / Self Care | Payer: Medicare Other | Source: Ambulatory Visit | Attending: Cardiothoracic Surgery | Admitting: Cardiothoracic Surgery

## 2015-01-11 ENCOUNTER — Encounter (HOSPITAL_COMMUNITY)
Admission: RE | Disposition: A | Discharge status: Home / Self Care | Payer: Self-pay | Source: Ambulatory Visit | Attending: Cardiothoracic Surgery

## 2015-01-11 ENCOUNTER — Encounter: Payer: Self-pay | Admitting: *Deleted

## 2015-01-11 DIAGNOSIS — E785 Hyperlipidemia, unspecified: Secondary | ICD-10-CM | POA: Insufficient documentation

## 2015-01-11 DIAGNOSIS — K219 Gastro-esophageal reflux disease without esophagitis: Secondary | ICD-10-CM | POA: Insufficient documentation

## 2015-01-11 DIAGNOSIS — I2510 Atherosclerotic heart disease of native coronary artery without angina pectoris: Secondary | ICD-10-CM | POA: Insufficient documentation

## 2015-01-11 DIAGNOSIS — Z7982 Long term (current) use of aspirin: Secondary | ICD-10-CM | POA: Diagnosis not present

## 2015-01-11 DIAGNOSIS — Z886 Allergy status to analgesic agent status: Secondary | ICD-10-CM | POA: Insufficient documentation

## 2015-01-11 DIAGNOSIS — C3412 Malignant neoplasm of upper lobe, left bronchus or lung: Secondary | ICD-10-CM | POA: Insufficient documentation

## 2015-01-11 DIAGNOSIS — R918 Other nonspecific abnormal finding of lung field: Secondary | ICD-10-CM

## 2015-01-11 DIAGNOSIS — I10 Essential (primary) hypertension: Secondary | ICD-10-CM | POA: Insufficient documentation

## 2015-01-11 DIAGNOSIS — I4891 Unspecified atrial fibrillation: Secondary | ICD-10-CM | POA: Insufficient documentation

## 2015-01-11 DIAGNOSIS — Z881 Allergy status to other antibiotic agents status: Secondary | ICD-10-CM | POA: Diagnosis not present

## 2015-01-11 DIAGNOSIS — I739 Peripheral vascular disease, unspecified: Secondary | ICD-10-CM | POA: Insufficient documentation

## 2015-01-11 DIAGNOSIS — C3490 Malignant neoplasm of unspecified part of unspecified bronchus or lung: Secondary | ICD-10-CM

## 2015-01-11 DIAGNOSIS — Z87891 Personal history of nicotine dependence: Secondary | ICD-10-CM | POA: Diagnosis not present

## 2015-01-11 HISTORY — PX: VIDEO BRONCHOSCOPY WITH ENDOBRONCHIAL ULTRASOUND: SHX6177

## 2015-01-11 HISTORY — PX: LUNG BIOPSY: SHX5088

## 2015-01-11 HISTORY — DX: Other complications of anesthesia, initial encounter: T88.59XA

## 2015-01-11 HISTORY — DX: Adverse effect of unspecified anesthetic, initial encounter: T41.45XA

## 2015-01-11 HISTORY — DX: Gastro-esophageal reflux disease without esophagitis: K21.9

## 2015-01-11 HISTORY — DX: Malignant (primary) neoplasm, unspecified: C80.1

## 2015-01-11 HISTORY — DX: Reserved for inherently not codable concepts without codable children: IMO0001

## 2015-01-11 HISTORY — DX: Unspecified osteoarthritis, unspecified site: M19.90

## 2015-01-11 LAB — PROTIME-INR
INR: 1.06 (ref 0.00–1.49)
Prothrombin Time: 14 seconds (ref 11.6–15.2)

## 2015-01-11 LAB — COMPREHENSIVE METABOLIC PANEL
ALT: 14 U/L (ref 0–53)
AST: 26 U/L (ref 0–37)
Albumin: 2.9 g/dL — ABNORMAL LOW (ref 3.5–5.2)
Alkaline Phosphatase: 106 U/L (ref 39–117)
Anion gap: 5 (ref 5–15)
BUN: 10 mg/dL (ref 6–23)
CO2: 32 mmol/L (ref 19–32)
Calcium: 8.9 mg/dL (ref 8.4–10.5)
Chloride: 105 mmol/L (ref 96–112)
Creatinine, Ser: 0.93 mg/dL (ref 0.50–1.35)
GFR calc Af Amer: 88 mL/min — ABNORMAL LOW (ref >90)
GFR calc non Af Amer: 76 mL/min — ABNORMAL LOW (ref >90)
Glucose, Bld: 112 mg/dL — ABNORMAL HIGH (ref 70–99)
Potassium: 3.6 mmol/L (ref 3.5–5.1)
Sodium: 142 mmol/L (ref 135–145)
Total Bilirubin: 1.1 mg/dL (ref 0.3–1.2)
Total Protein: 6.3 g/dL (ref 6.0–8.3)

## 2015-01-11 LAB — CBC
HCT: 37 % — ABNORMAL LOW (ref 39.0–52.0)
Hemoglobin: 11.9 g/dL — ABNORMAL LOW (ref 13.0–17.0)
MCH: 27.6 pg (ref 26.0–34.0)
MCHC: 32.2 g/dL (ref 30.0–36.0)
MCV: 85.8 fL (ref 78.0–100.0)
Platelets: 256 K/uL (ref 150–400)
RBC: 4.31 MIL/uL (ref 4.22–5.81)
RDW: 14.3 % (ref 11.5–15.5)
WBC: 5.1 K/uL (ref 4.0–10.5)

## 2015-01-11 LAB — APTT: aPTT: 33 s (ref 24–37)

## 2015-01-11 SURGERY — BRONCHOSCOPY, WITH EBUS
Anesthesia: General

## 2015-01-11 MED ORDER — PHENYLEPHRINE 40 MCG/ML (10ML) SYRINGE FOR IV PUSH (FOR BLOOD PRESSURE SUPPORT)
PREFILLED_SYRINGE | INTRAVENOUS | Status: AC
  Filled 2015-01-11: qty 10

## 2015-01-11 MED ORDER — NEOSTIGMINE METHYLSULFATE 10 MG/10ML IV SOLN
INTRAVENOUS | Status: AC
  Filled 2015-01-11: qty 1

## 2015-01-11 MED ORDER — FENTANYL CITRATE 0.05 MG/ML IJ SOLN
INTRAMUSCULAR | Status: DC | PRN
  Administered 2015-01-11: 200 ug via INTRAVENOUS

## 2015-01-11 MED ORDER — EPINEPHRINE HCL 1 MG/ML IJ SOLN
INTRAMUSCULAR | Status: DC | PRN
  Administered 2015-01-11: 1 mg via ENDOTRACHEOPULMONARY

## 2015-01-11 MED ORDER — MEPERIDINE HCL 25 MG/ML IJ SOLN: 6.2500 mg | INTRAMUSCULAR | Status: DC | PRN

## 2015-01-11 MED ORDER — GLYCOPYRROLATE 0.2 MG/ML IJ SOLN
INTRAMUSCULAR | Status: DC | PRN
  Administered 2015-01-11: .5 mg via INTRAVENOUS

## 2015-01-11 MED ORDER — PROPOFOL 10 MG/ML IV BOLUS
INTRAVENOUS | Status: DC | PRN
  Administered 2015-01-11: 100 mg via INTRAVENOUS

## 2015-01-11 MED ORDER — LIDOCAINE HCL (CARDIAC) 20 MG/ML IV SOLN
INTRAVENOUS | Status: AC
  Filled 2015-01-11: qty 5

## 2015-01-11 MED ORDER — NEOSTIGMINE METHYLSULFATE 10 MG/10ML IV SOLN
INTRAVENOUS | Status: DC | PRN
  Administered 2015-01-11: 2.5 mg via INTRAVENOUS

## 2015-01-11 MED ORDER — ALBUTEROL SULFATE (2.5 MG/3ML) 0.083% IN NEBU
INHALATION_SOLUTION | RESPIRATORY_TRACT | Status: AC
  Administered 2015-01-11: 2.5 mg
  Filled 2015-01-11: qty 3

## 2015-01-11 MED ORDER — LACTATED RINGERS IV SOLN
INTRAVENOUS | Status: DC
  Administered 2015-01-11 (×2): via INTRAVENOUS

## 2015-01-11 MED ORDER — ALBUTEROL SULFATE (2.5 MG/3ML) 0.083% IN NEBU: 2.5000 mg | INHALATION_SOLUTION | Freq: Once | RESPIRATORY_TRACT | Status: DC

## 2015-01-11 MED ORDER — ONDANSETRON HCL 4 MG/2ML IJ SOLN
INTRAMUSCULAR | Status: DC | PRN
  Administered 2015-01-11: 4 mg via INTRAVENOUS

## 2015-01-11 MED ORDER — PROPOFOL 10 MG/ML IV BOLUS
INTRAVENOUS | Status: AC
  Filled 2015-01-11: qty 20

## 2015-01-11 MED ORDER — GLYCOPYRROLATE 0.2 MG/ML IJ SOLN
INTRAMUSCULAR | Status: AC
  Filled 2015-01-11: qty 2

## 2015-01-11 MED ORDER — PHENYLEPHRINE HCL 10 MG/ML IJ SOLN
INTRAMUSCULAR | Status: AC
  Filled 2015-01-11: qty 1

## 2015-01-11 MED ORDER — SODIUM CHLORIDE 0.9 % IV SOLN
10.0000 mg | INTRAVENOUS | Status: DC | PRN
  Administered 2015-01-11: 20 ug/min via INTRAVENOUS

## 2015-01-11 MED ORDER — LIDOCAINE HCL (CARDIAC) 20 MG/ML IV SOLN
INTRAVENOUS | Status: DC | PRN
  Administered 2015-01-11: 40 mg via INTRAVENOUS

## 2015-01-11 MED ORDER — EPINEPHRINE HCL 1 MG/ML IJ SOLN
INTRAMUSCULAR | Status: AC
  Filled 2015-01-11: qty 1

## 2015-01-11 MED ORDER — ROCURONIUM BROMIDE 100 MG/10ML IV SOLN
INTRAVENOUS | Status: DC | PRN
  Administered 2015-01-11: 30 mg via INTRAVENOUS

## 2015-01-11 MED ORDER — HYDROMORPHONE HCL 1 MG/ML IJ SOLN: 0.2500 mg | INTRAMUSCULAR | Status: DC | PRN

## 2015-01-11 MED ORDER — PHENYLEPHRINE HCL 10 MG/ML IJ SOLN
INTRAMUSCULAR | Status: DC | PRN
  Administered 2015-01-11: 80 ug via INTRAVENOUS
  Administered 2015-01-11: 120 ug via INTRAVENOUS

## 2015-01-11 MED ORDER — ONDANSETRON HCL 4 MG/2ML IJ SOLN
INTRAMUSCULAR | Status: AC
  Filled 2015-01-11: qty 2

## 2015-01-11 MED ORDER — ROCURONIUM BROMIDE 50 MG/5ML IV SOLN
INTRAVENOUS | Status: AC
  Filled 2015-01-11: qty 1

## 2015-01-11 MED ORDER — ONDANSETRON HCL 4 MG/2ML IJ SOLN: 4.0000 mg | Freq: Once | INTRAMUSCULAR | Status: DC | PRN

## 2015-01-11 MED ORDER — FENTANYL CITRATE 0.05 MG/ML IJ SOLN
INTRAMUSCULAR | Status: AC
  Filled 2015-01-11: qty 5

## 2015-01-11 MED ORDER — 0.9 % SODIUM CHLORIDE (POUR BTL) OPTIME
TOPICAL | Status: DC | PRN
  Administered 2015-01-11: 1000 mL

## 2015-01-11 SURGICAL SUPPLY — 24 items
BRUSH CYTOL CELLEBRITY 1.5X140 (MISCELLANEOUS) ×2 IMPLANT
CANISTER SUCTION 2500CC (MISCELLANEOUS) ×2 IMPLANT
CONT SPEC 4OZ CLIKSEAL STRL BL (MISCELLANEOUS) ×8 IMPLANT
COVER TABLE BACK 60X90 (DRAPES) ×2 IMPLANT
FILTER STRAW FLUID ASPIR (MISCELLANEOUS) ×2 IMPLANT
FORCEPS BIOP RJ4 1.8 (CUTTING FORCEPS) ×2 IMPLANT
GAUZE SPONGE 4X4 12PLY STRL (GAUZE/BANDAGES/DRESSINGS) ×2 IMPLANT
GLOVE BIO SURGEON STRL SZ 6.5 (GLOVE) ×6 IMPLANT
GLOVE BIOGEL PI IND STRL 6 (GLOVE) ×3 IMPLANT
GLOVE BIOGEL PI INDICATOR 6 (GLOVE) ×3
KIT CLEAN ENDO COMPLIANCE (KITS) ×4 IMPLANT
KIT ROOM TURNOVER OR (KITS) ×2 IMPLANT
MARKER SKIN DUAL TIP RULER LAB (MISCELLANEOUS) ×2 IMPLANT
NEEDLE BIOPSY TRANSBRONCH 21G (NEEDLE) IMPLANT
NEEDLE BLUNT 18X1 FOR OR ONLY (NEEDLE) IMPLANT
NEEDLE SYS SONOTIP II EBUSTBNA (NEEDLE) ×2 IMPLANT
NS IRRIG 1000ML POUR BTL (IV SOLUTION) ×2 IMPLANT
OIL SILICONE PENTAX (PARTS (SERVICE/REPAIRS)) ×2 IMPLANT
PAD ARMBOARD 7.5X6 YLW CONV (MISCELLANEOUS) ×6 IMPLANT
SYR 20CC LL (SYRINGE) ×2 IMPLANT
SYR 20ML ECCENTRIC (SYRINGE) ×2 IMPLANT
TOWEL OR 17X24 6PK STRL BLUE (TOWEL DISPOSABLE) ×2 IMPLANT
TRAP SPECIMEN MUCOUS 40CC (MISCELLANEOUS) ×2 IMPLANT
TUBE CONNECTING 12X1/4 (SUCTIONS) ×4 IMPLANT

## 2015-01-11 NOTE — Anesthesia Procedure Notes (Signed)
Procedure Name: Intubation Date/Time: 01/11/2015 9:56 AM Performed by: Kyung Rudd Pre-anesthesia Checklist: Patient identified, Emergency Drugs available, Suction available, Patient being monitored and Timeout performed Patient Re-evaluated:Patient Re-evaluated prior to inductionOxygen Delivery Method: Circle system utilized Preoxygenation: Pre-oxygenation with 100% oxygen Intubation Type: IV induction Ventilation: Mask ventilation without difficulty Laryngoscope Size: Mac and 3 Grade View: Grade I Tube type: Oral Tube size: 8.5 mm Number of attempts: 1 Airway Equipment and Method: Stylet Placement Confirmation: ETT inserted through vocal cords under direct vision,  positive ETCO2 and breath sounds checked- equal and bilateral Secured at: 21 cm Tube secured with: Tape Dental Injury: Teeth and Oropharynx as per pre-operative assessment

## 2015-01-11 NOTE — Brief Op Note (Signed)
      [address]       ,[place] [address]             [phone]      01/11/2015  11:49 AM  PATIENT:  Luke Contreras  81 y.o. 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   Delay start of Pharmacological VTE agent (>24hrs) due to surgical blood loss or risk of bleeding: yes

## 2015-01-11 NOTE — OR Nursing (Signed)
Frozen Bx received in lab @1125 .

## 2015-01-11 NOTE — Transfer of Care (Signed)
Immediate Anesthesia Transfer of Care Note  Patient: Luke Contreras  Procedure(s) Performed: Procedure(s): VIDEO BRONCHOSCOPY WITH ENDOBRONCHIAL ULTRASOUND (N/A) LUNG BIOPSY (N/A)  Patient Location: PACU  Anesthesia Type:General  Level of Consciousness: awake, alert  and oriented  Airway & Oxygen Therapy: Patient Spontanous Breathing and Patient connected to face mask oxygen  Post-op Assessment: Report given to RN, Post -op Vital signs reviewed and stable and Patient moving all extremities  Post vital signs: Reviewed and stable  Last Vitals:  Filed Vitals:   01/11/15 0716  BP: 122/62  Pulse: 79  Temp: 36.8 C  Resp: 18    Complications: No apparent anesthesia complications

## 2015-01-11 NOTE — Anesthesia Preprocedure Evaluation (Addendum)
Anesthesia Evaluation  Patient identified by MRN, date of birth, ID band  Reviewed: Allergy & Precautions, NPO status , Patient's Chart, lab work & pertinent test results  Airway Mallampati: I  TM Distance: >3 FB Neck ROM: Full    Dental  (+) Edentulous Upper   Pulmonary shortness of breath and with exertion, former smoker,          Cardiovascular hypertension, Pt. on medications + CAD and + Peripheral Vascular Disease     Neuro/Psych    GI/Hepatic GERD-  Medicated and Controlled,  Endo/Other    Renal/GU      Musculoskeletal   Abdominal   Peds  Hematology   Anesthesia Other Findings   Reproductive/Obstetrics                            Anesthesia Physical Anesthesia Plan  ASA: III  Anesthesia Plan: General   Post-op Pain Management:    Induction: Intravenous  Airway Management Planned: Oral ETT  Additional Equipment:   Intra-op Plan:   Post-operative Plan: Extubation in OR  Informed Consent: I have reviewed the patients History and Physical, chart, labs and discussed the procedure including the risks, benefits and alternatives for the proposed anesthesia with the patient or authorized representative who has indicated his/her understanding and acceptance.   Dental advisory given  Plan Discussed with: CRNA and Surgeon  Anesthesia Plan Comments:        Anesthesia Quick Evaluation

## 2015-01-11 NOTE — Discharge Instructions (Signed)
Flexible Bronchoscopy, Care After Refer to this sheet in the next few weeks. These instructions provide you with information on caring for yourself after your procedure. Your health care provider may also give you more specific instructions. Your treatment has been planned according to current medical practices, but problems sometimes occur. Call your health care provider if you have any problems or questions after your procedure.  WHAT TO EXPECT AFTER THE PROCEDURE It is normal to have the following symptoms for 24-48 hours after the procedure:   Increased cough.  Low-grade fever.  Sore throat or hoarse voice.  Small streaks of blood in your thick spit (sputum) if tissue samples were taken (biopsy). HOME CARE INSTRUCTIONS   Do not eat or drink anything for 2 hours after your procedure. Your nose and throat were numbed by medicine. If you try to eat or drink before the medicine wears off, food or drink could go into your lungs or you could burn yourself. After the numbness is gone and your cough and gag reflexes have returned, you may eat soft food and drink liquids slowly.   The day after the procedure, you can go back to your normal diet.   You may resume normal activities.   Keep all follow-up visits as directed by your health care provider. It is important to keep all your appointments, especially if tissue samples were taken for testing (biopsy). SEEK IMMEDIATE MEDICAL CARE IF:   You have increasing shortness of breath.   You become light-headed or faint.   You have chest pain.   You have any new concerning symptoms.  You cough up more than a small amount of blood.  The amount of blood you cough up increases. MAKE SURE YOU:  Understand these instructions.  Will watch your condition.  Will get help right away if you are not doing well or get worse. Document Released: 06/19/2005 Document Revised: 04/16/2014 Document Reviewed: 08/04/2013 [facility] Patient Information  2015 [place], [place]. This information is not intended to replace advice given to you by your health care provider. Make sure you discuss any questions you have with your health care provider.  General Anesthesia, Adult, Care After  Refer to this sheet in the next few weeks. These instructions provide you with information on caring for yourself after your procedure. Your health care provider may also give you more specific instructions. Your treatment has been planned according to current medical practices, but problems sometimes occur. Call your health care provider if you have any problems or questions after your procedure.  WHAT TO EXPECT AFTER THE PROCEDURE  After the procedure, it is typical to experience:  Sleepiness.  Nausea and vomiting. HOME CARE INSTRUCTIONS  For the first 24 hours after general anesthesia:  Have a responsible person with you.  Do not drive a car. If you are alone, do not take public transportation.  Do not drink alcohol.  Do not take medicine that has not been prescribed by your health care provider.  Do not sign important papers or make important decisions.  You may resume a normal diet and activities as directed by your health care provider.  Change bandages (dressings) as directed.  If you have questions or problems that seem related to general anesthesia, call the hospital and ask for the anesthetist or anesthesiologist on call. SEEK MEDICAL CARE IF:  You have nausea and vomiting that continue the day after anesthesia.  You develop a rash. SEEK IMMEDIATE MEDICAL CARE IF:  You have difficulty breathing.  You have chest pain.  You have any allergic problems. Document Released: 03/08/2001 Document Revised: 08/02/2013 Document Reviewed: 06/15/2013  [facility] Patient Information 2014 [place], [place].

## 2015-01-11 NOTE — Anesthesia Postprocedure Evaluation (Signed)
Anesthesia Post Note  Patient: Luke Contreras  Procedure(s) Performed: Procedure(s) (LRB): VIDEO BRONCHOSCOPY WITH ENDOBRONCHIAL ULTRASOUND (N/A) LUNG BIOPSY (N/A)  Anesthesia type: general  Patient location: PACU  Post pain: Pain level controlled  Post assessment: Patient's Cardiovascular Status Stable  Last Vitals:  Filed Vitals:   01/11/15 1345  BP:   Pulse: 87  Temp:   Resp:     Post vital signs: Reviewed and stable  Level of consciousness: sedated  Complications: No apparent anesthesia complications

## 2015-01-11 NOTE — H&P (Signed)
Luke Contreras       Luke Contreras,Luke Contreras Luke Contreras             Luke Contreras                    Luke Contreras [name] Medical Record #[id number] Date of Birth: [birth year]  Referring: No ref. provider found Primary Care: Myriam Jacobson, MD  Chief Complaint: hoarseness  with lung mass   History of Present Illness:    Luke Contreras 81 y.o. male is seen in the office  today for history of 6 weeks of hoarseness and found to have a left lung mass. 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   Current Activity/ Functional Status:  Patient is independent with mobility/ambulation, transfers, ADL's, IADL's.   Zubrod Score: At the time of surgery this patient's most appropriate activity status/level should be described as: []     0    Normal activity, no symptoms [x]     1    Restricted in physical strenuous activity but ambulatory, able to do out light work []     2    Ambulatory and capable of self care, unable to do work activities, up and about               >50 % of waking hours                              []     3    Only limited self care, in bed greater than 50% of waking hours []     4    Completely disabled, no self care, confined to bed or chair []     5    Moribund   Past Medical History  Diagnosis Date  . History of acute anterior wall myocardial infarction 11/2006  . Atrial fibrillation   . Dyslipidemia   . Hypertension   . Coronary artery disease     history of  obstructive single-valve disease  . PAC (premature atrial contraction)   . Left ventricular dysfunction     withEF of 40-45%   . Cancer     skin cancer- side of nausea  . Complication of anesthesia   . Shortness of breath dyspnea     " when talking and completing a long sentence  . GERD (gastroesophageal reflux disease)   . Arthritis     hands, knees    Past Surgical History  Procedure  Laterality Date  . Cardiac catheterization  11/26/2006    Est. EF of 40% --  A 100% obstructive proximal/mid left anterior descending artery consistent with recent anterior myocardial infarction --  Moderate mid right coronary artery disease --Mild left ventricular dysfunction with EF of 40%  with distal anterior akinesis and periapical dyskinesis    -- Kaylyn Lim., M.D.  . Colonoscopy      Family History  Problem Relation Age of Onset  . Heart attack Father 31    History   Social History  . Marital Status: Married    Spouse Name: N/A    Number of Children: 1  . Years of Education: N/A   Occupational History  . retired    Social History Main Topics  . Smoking status: Former Smoker -- 1.50 packs/day for 30 years  Types: Cigarettes    Quit date: 12/14/1978  . Smokeless tobacco: Never Used  . Alcohol Use: No  . Drug Use: No  . Sexual Activity: Not on file   Other Topics Concern  . Not on file   Social History Narrative    History  Smoking status  . Former Smoker -- 1.50 packs/day for 30 years  . Types: Cigarettes  . Quit date: 12/14/1978  Smokeless tobacco  . Never Used    History  Alcohol Use No     Allergies  Allergen Reactions  . Cephalexin Other (See Comments)    Stomach pain  . Aspirin     Bother stomach    No current facility-administered medications for this encounter.       Review of Systems:     Cardiac Review of Systems: Y or N  Chest Pain [ n   ]  Resting SOB [ n  ] Exertional SOB  [ y ]  Orthopnea [ n ]   Pedal Edema [ n  ]    Palpitations [ n ] Syncope  [ n ]   Presyncope [ n  ]  General Review of Systems: [Y] = yes [  ]=no Constitional: recent weight change [y 12 lbs  ];  Wt loss over the last 3 months [   ] anorexia [  ]; fatigue [  ]; nausea [  ]; night sweats [  ]; fever [  ]; or chills [  ];          Dental: poor dentition[ n ]; Last Dentist visit:   Eye : blurred vision [  ]; diplopia [   ]; vision changes [  ];   Amaurosis fugax[ n ]; Resp: cough [ y ];  wheezing[n  ];  hemoptysis[ n ]; shortness of breath[ y ]; paroxysmal nocturnal dyspnea[ y ]; dyspnea on exertion[y  ]; or orthopnea[  ];  GI:  gallstones[  ], vomiting[ n ];  dysphagia[  ]; melena[ n ];  hematochezia [ n ]; heartburn[  ];   Hx of  Colonoscopy[  ]; GU: kidney stones [  ]; hematuria[  ];   dysuria [  ];  nocturia[  ];  history of     obstruction [  ]; urinary frequency [  ]             Skin: rash, swelling[  ];, hair loss[  ];  peripheral edema[  ];  or itching[  ]; Musculosketetal: myalgias[  ];  joint swelling[  ];  joint erythema[  ];  joint pain[  ];  back pain[  ];  Heme/Lymph: bruising[n  ];  bleeding[  ];  anemia[  ];  Neuro: TIA[  ];  headaches[  ];  stroke[  ];  vertigo[  ];  seizures[  ];   paresthesias[  ];  difficulty walking[  ];  Psych:depression[  ]; anxiety[  ];  Endocrine: diabetes[  ];  thyroid dysfunction[  ];  Immunizations: Flu up to date [ ? ]; Pneumococcal up to date [ ? ];  Other:  Physical Exam: There were no vitals taken for this visit.  PHYSICAL EXAMINATION: General appearance: alert, cooperative and appears stated age Head: Normocephalic, without obvious abnormality, atraumatic Neck: no adenopathy, no carotid bruit, no JVD, supple, symmetrical, trachea midline and thyroid not enlarged, symmetric, no tenderness/mass/nodules Lymph nodes: Cervical, supraclavicular, and axillary nodes normal. Resp: diminished breath sounds RUL Back: symmetric, no curvature. ROM normal. No CVA tenderness.  Cardio: regular rate and rhythm, S1, S2 normal, no murmur, click, rub or gallop GI: soft, non-tender; bowel sounds normal; no masses,  no organomegaly Extremities: extremities normal, atraumatic, no cyanosis or edema and Homans sign is negative, no sign of DVT Neurologic: Alert and oriented X 3, normal strength and tone. Normal symmetric reflexes. Normal coordination and gait  Hoarseness with voice fatigue as he  talks  Diagnostic Studies & Laboratory data:     Recent Radiology Findings:   Dg Neck Soft Tissue  12/29/2014   CLINICAL DATA:  Hoarseness.  EXAM: NECK SOFT TISSUES - 1+ VIEW  COMPARISON:  Chest radiographs obtained at the same time and on 11/27/2006.  FINDINGS: The soft tissues of the neck have normal appearances. Cervical spine degenerative changes are noted. Increased in scratch increased density in the left hemithorax will be discussed on the chest radiographs report.  IMPRESSION: 1. And normal appearing neck soft tissues and airway. 2. Cervical spine degenerative changes. 3. Increased density in the left hemothorax, discussed in the chest radiographs report.   Electronically Signed   By: Enrique Sack M.D.   On: 12/29/2014 11:28   Dg Chest 2 View  12/29/2014   CLINICAL DATA:  Hoarseness.  Shortness of breath.  EXAM: CHEST  2 VIEW  COMPARISON:  11/27/2006  FINDINGS: New left hilar mass is seen with postobstructive atelectasis involving the left upper lobe. No evidence of pleural effusion. Right lung remains clear. Heart size is normal.  IMPRESSION: New left hilar mass with postobstructive left upper lobe atelectasis. This is highly suspicious for bronchogenic carcinoma. Chest CT with contrast recommended for further evaluation.  These results will be called to the ordering clinician or representative by the Radiologist Assistant, and communication documented in the PACS or zVision Dashboard.   Electronically Signed   By: Earle Gell M.D.   On: 12/29/2014 11:28   Ct Chest W Contrast  01/02/2015   CLINICAL DATA:  Left hilar mass. Occasional productive cough for 5 weeks. Shortness of breath.  EXAM: CT CHEST WITH CONTRAST  TECHNIQUE: Multidetector CT imaging of the chest was performed during intravenous contrast administration.  CONTRAST:  75 cc Omnipaque 300 IV.  COMPARISON:  Chest x-ray 12/29/2014.  FINDINGS: There is a large necrotic-appearing left hilar and suprahilar mass with associated left upper  lobe collapse. It is difficult to measure due to the collapsed left upper lobe. Central left scratch has central low-density area measures approximately 4.5 x 2.8 cm on image 27 anteriorly in the left hilum/suprahilar region. The left upper lobe bronchus occludes proximally.  Mild to moderate centrilobular emphysema. No confluent opacity or nodule on the right. No pleural effusions.  Calcified left hilar and mediastinal lymph nodes compatible with old granulomatous disease. No mediastinal, hilar or axillary adenopathy. Heart is normal size. Scattered coronary artery calcifications. Aorta is slightly dilated, 4 cm in the ascending thoracic aorta.  No acute bony abnormality or focal bone lesion. Imaging into the upper abdomen shows no acute findings. Small low-density lesions centrally within the spleen measuring 12 mm, most likely cyst or hemangioma.  IMPRESSION: Occlusion of the left upper lobe bronchus with necrotic appearing left hilar/ suprahilar mass and complete collapse of the left upper lobe. 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  Coronary artery disease.  Mild to moderate emphysema.   Electronically Signed   By: Rolm Baptise M.D.   On: 01/02/2015 09:54     I have independently reviewed the above radiologic studies. With elevated diaphragm also  Recent Lab Findings: Lab Results  Component Value Date   WBC 7.2 12/29/2014   HGB 13.5* 12/29/2014   HCT 43.1* 12/29/2014   GLUCOSE 96 12/29/2014   CHOL 91 04/03/2013   TRIG 58.0 04/03/2013   HDL 34.40* 04/03/2013   LDLCALC 45 04/03/2013   ALT 26 04/03/2013   AST 33 04/03/2013   NA 138 12/29/2014   K 4.1 12/29/2014   CL 104 12/29/2014   CREATININE 0.88 12/29/2014   BUN 13 12/29/2014   CO2 26 12/29/2014      Assessment / Plan:   Likely,  at least Stage IIIA (cT4) carcinoma with left hilar mass with recurrent and phrenic nerve involvement. I have discussed radiographic findings and likely dx of of carcinoma of the lung.  I have recommended to the patient we proceed with bronchoscopy and ebus  to obtain tissue dx. Risks and options have been discussed      The goals risks and alternatives of the planned surgical procedure bronchoscopy and ebus with biopsy  have been discussed with the patient in detail. The risks of the procedure including death, infection, stroke, myocardial infarction, bleeding, blood transfusion have all been discussed specifically.  I have quoted Luke Contreras a 1 % of perioperative mortality and a complication rate as high as 10 %. The patient's questions have been answered.Luke Contreras is willing  to proceed with the planned procedure.   Grace Isaac MD      [address] [place] [address] Office [phone]   Beeper [phone]  01/11/2015 7:15 AM

## 2015-01-11 NOTE — [facility] Oncology Navigator Note (Unsigned)
   Thoracic Treatment Summary Name:Luke Contreras Date:01/11/15 DOB:May 12, 1930 Your Medical Team Medical Oncologist:Dr. Julien Nordmann Radiation Oncologist:Dr. Tammi Klippel Surgeon:Dr.Gerhardt Type and Stage of Lung Cancer  Clinical Stage:  Unknown at this time.  Need more staging work up  Clinical stage is based on radiology exams.  Pathological stage will be determined after surgery.  Staging is based on the size of the tumor, involvement of lymph nodes or not, and whether or not the cancer center has spread. Recommendations Recommendations: Tissue for diagnosis.  This is scheduled for 01/11/15  These recommendations are based on information available as of today's consult.  This is subject to change depending further testing or exams. Next Steps Next Step: Surgery will set up follow up appointments  Medical Oncology will set up follow up appointments Barriers to Care What do you perceive as a potential barrier that may prevent you from receiving your treatment plan? Support Information given on support services at [place]: Research scientist (life sciences) at [email or web address] [phone] More information will be given after diagnosis    Questions Norton Blizzard, RN BSN Thoracic Oncology Nurse Navigator at [place] is a nurse navigator that is available to assist you through your cancer journey.  She can answer your questions and/or provide resources regarding your treatment plan, emotional support, or financial concerns.

## 2015-01-14 ENCOUNTER — Telehealth: Payer: Self-pay | Admitting: *Deleted

## 2015-01-14 ENCOUNTER — Encounter (HOSPITAL_COMMUNITY): Payer: Self-pay | Admitting: Cardiothoracic Surgery

## 2015-01-14 ENCOUNTER — Telehealth: Payer: Self-pay | Admitting: Internal Medicine

## 2015-01-14 NOTE — Telephone Encounter (Signed)
Patient's son left me a vm message to call with appt.  I called, patient is set up to see Dr. Julien Nordmann on 01/17/15 at 2:30.  He verbalized understanding of appt time and place.

## 2015-01-14 NOTE — Op Note (Signed)
Luke Contreras, Luke Contreras                  ACCOUNT NO.:  [id number]  MEDICAL RECORD NO.:  [id number]  LOCATION:                                 FACILITY:  PHYSICIAN:  Lanelle Bal, MD    DATE OF BIRTH:  1930/07/04  DATE OF PROCEDURE:  01/11/2015 DATE OF DISCHARGE:  01/11/2015                              OPERATIVE REPORT   PREOPERATIVE DIAGNOSIS:  Left hilar mass suspicious for carcinoma.  POSTOPERATIVE DIAGNOSIS:  Left hilar mass suspicious for carcinoma.  SURGICAL PROCEDURE:  Bronchoscopy with endobronchial ultrasound and transbronchial biopsy and biopsy of endobronchial mass and brushings of left upper lobe endobronchial mass.  SURGEON:  Lanelle Bal, MD  BRIEF HISTORY:  The patient is an 81 year old male, who presented with hoarseness and elevated left diaphragm with a left hilar mass suggestive of carcinoma of the lung.  The patient had some left upper lobe collapse and with the central lesion, bronchoscopy and EBUS to obtain a tissue diagnosis was recommended to the patient who agreed and signed informed consent.  DESCRIPTION OF PROCEDURE:  The patient underwent general endotracheal anesthesia and was intubated with a single-lumen endotracheal tube. Appropriate time-out was performed.  A 2.8 mm video bronchoscope was then introduced into the tracheobronchial tree.  The right tracheobronchial tree appeared without any endobronchial lesions.  The left tracheobronchial tree with takeoff of the left upper lobe bronchus was erythematous with a small amount of hemorrhagic material.  After assessing this, we then removed the bronchoscope and placed the EBUS scope.  This was positioned into the main stem of left bronchus and identifying 4L lymph nodes which were noted on the CT scan.  Multiple transbronchial biopsies of the area with EBUS guidance were obtained. Initial smears on this material did not confirm a diagnosis.  We then removed the EBUS scope with the standard video  scope and took brushings and multiple biopsies of the left lung mass.  Initial brushings did not confirm a diagnosis.  Additional biopsies with a small biopsy forceps were frozen and did confirm a malignancy.  Further biopsies of the same area were obtained to make sure we had sufficient material for staining and genetic testing if appropriate.  The scope was then removed.  The patient was awakened and extubated in the operating room.  He tolerated the procedure without obvious complication and was transferred to the recovery room for further postoperative care.  Final path report is pending.     Lanelle Bal, MD    EG/MEDQ  D:  01/14/2015  T:  01/14/2015  Job:  [id number]

## 2015-01-14 NOTE — Telephone Encounter (Signed)
Sent message to MM re f/u appt 1/28. At this time pt has been scheduled to see MM 2/4 by thoracic coord and per documenation pt son is aware. no futher action needed from scheduling. lm for [employer].

## 2015-01-17 ENCOUNTER — Telehealth: Payer: Self-pay | Admitting: Internal Medicine

## 2015-01-17 ENCOUNTER — Encounter: Payer: Self-pay | Admitting: *Deleted

## 2015-01-17 ENCOUNTER — Encounter: Payer: Self-pay | Admitting: Internal Medicine

## 2015-01-17 ENCOUNTER — Ambulatory Visit (HOSPITAL_BASED_OUTPATIENT_CLINIC_OR_DEPARTMENT_OTHER): Payer: Medicare Other | Admitting: Internal Medicine

## 2015-01-17 VITALS — BP 119/62 | HR 93 | Temp 98.1°F | Resp 18 | Ht 68.0 in | Wt 185.4 lb

## 2015-01-17 DIAGNOSIS — C3412 Malignant neoplasm of upper lobe, left bronchus or lung: Secondary | ICD-10-CM

## 2015-01-17 DIAGNOSIS — C3492 Malignant neoplasm of unspecified part of left bronchus or lung: Secondary | ICD-10-CM

## 2015-01-17 NOTE — Telephone Encounter (Signed)
gv adn prnted appt sched and avs fo rpt for Feb

## 2015-01-17 NOTE — [facility] Oncology Navigator Note (Unsigned)
Spoke with patient today at thoracic clinic.  I notified him that radiology dept called and can move PET scan to tomorrow at 10:30.  I gave him pre procedure instructions.  Nothing by mouth after 5 am.  Patient is not diabetic nor is claustrophobic.

## 2015-01-17 NOTE — Progress Notes (Signed)
[facility] [phone]   [phone] Multidisciplinary thoracic oncology clinic  OFFICE PROGRESS NOTE  Myriam Jacobson, [place], [place] [address]  DIAGNOSIS: Locally advanced non-small cell lung cancer, poorly differentiated adenocarcinoma diagnosed in January 2016. Staging workup is still pending.  PRIOR THERAPY: None  CURRENT THERAPY: None  INTERVAL HISTORY: Luke Contreras 81 y.o. male returns to the clinic today for follow-up visit accompanied by his son and wife. The patient is feeling fine today with no specific complaints except for the baseline shortness of breath in addition to hoarseness of his voice. He was seen by Dr. Servando Snare last week at the multidisciplinary thoracic oncology clinic. The following day the patient underwent bronchoscopy with endobronchial ultrasound and transbronchial biopsy as well as brushing of the left upper lobe endobronchial mass. The final pathology was consistent with poorly differentiated adenocarcinoma. The patient had ordered for a PET scan as well as MRI of the brain but has not been done yet. He is here today for evaluation and discussion of his treatment options. He denied having any significant weight loss or night sweats. He has no chest pain but continues to have shortness breath with exertion with mild cough with no hemoptysis. The patient denied having any significant nausea or vomiting.  MEDICAL HISTORY: Past Medical History  Diagnosis Date  . History of acute anterior wall myocardial infarction 11/2006  . Atrial fibrillation   . Dyslipidemia   . Hypertension   . Coronary artery disease     history of  obstructive single-valve disease  . PAC (premature atrial contraction)   . Left ventricular dysfunction     withEF of 40-45%   . Cancer     skin cancer- side of nausea  . Complication of anesthesia   . Shortness of breath dyspnea     " when talking and completing a long  sentence  . GERD (gastroesophageal reflux disease)   . Arthritis     hands, knees    ALLERGIES:  is allergic to cephalexin and aspirin.  MEDICATIONS:  Current Outpatient Prescriptions  Medication Sig Dispense Refill  . alum & mag hydroxide-simeth (MAALOX/MYLANTA) 200-200-20 MG/5ML suspension Take by mouth every 6 (six) hours as needed for indigestion or heartburn.    Marland Kitchen aspirin 325 MG EC tablet Take 325 mg by mouth daily.    Marland Kitchen atorvastatin (LIPITOR) 40 MG tablet Take 0.5 tablets (20 mg total) by mouth daily. 45 tablet 0  . clorazepate (TRANXENE) 7.5 MG tablet Take 7.5 mg by mouth at bedtime.      . Cyanocobalamin (VITAMIN B-12 PO) Take 500 mcg by mouth daily.     Marland Kitchen Dextromethorphan-Guaifenesin (CORICIDIN HBP CONGESTION/COUGH PO) Take by mouth daily as needed.    . fluorouracil (EFUDEX) 5 % cream Apply 1 application topically daily.   0  . lisinopril (PRINIVIL,ZESTRIL) 2.5 MG tablet Take 1 tablet (2.5 mg total) by mouth daily. 90 tablet 0  . Misc Natural Products (OSTEO BI-FLEX JOINT SHIELD PO) Take 1 tablet by mouth daily.      . multivitamin (THERAGRAN) per tablet Take 1 tablet by mouth daily.      Marland Kitchen omeprazole (PRILOSEC) 20 MG capsule Take 20 mg by mouth daily.     Marland Kitchen NITROSTAT 0.4 MG SL tablet Place 0.4 mg under the tongue every 5 (five) minutes as needed for chest pain.      No current facility-administered medications for this visit.    SURGICAL  HISTORY:  Past Surgical History  Procedure Laterality Date  . Cardiac catheterization  11/26/2006    Est. EF of 40% --  A 100% obstructive proximal/mid left anterior descending artery consistent with recent anterior myocardial infarction --  Moderate mid right coronary artery disease --Mild left ventricular dysfunction with EF of 40%  with distal anterior akinesis and periapical dyskinesis    -- Kaylyn Lim., M.D.  . Colonoscopy    . Video bronchoscopy with endobronchial ultrasound N/A 01/11/2015    Procedure: VIDEO BRONCHOSCOPY  WITH ENDOBRONCHIAL ULTRASOUND;  Surgeon: Grace Isaac, MD;  Location: [facility];  Service: Thoracic;  Laterality: N/A;  . Lung biopsy N/A 01/11/2015    Procedure: LUNG BIOPSY;  Surgeon: Grace Isaac, MD;  Location: [facility];  Service: Thoracic;  Laterality: N/A;    REVIEW OF SYSTEMS:  Constitutional: negative Eyes: negative Ears, nose, mouth, throat, and face: positive for hoarseness Respiratory: positive for cough and dyspnea on exertion Cardiovascular: negative Gastrointestinal: negative Genitourinary:negative Integument/breast: negative Hematologic/lymphatic: negative Musculoskeletal:negative Neurological: negative Behavioral/Psych: negative Endocrine: negative Allergic/Immunologic: negative   PHYSICAL EXAMINATION: General appearance: alert, cooperative, fatigued and no distress Head: Normocephalic, without obvious abnormality, atraumatic Neck: no adenopathy, no JVD, supple, symmetrical, trachea midline and thyroid not enlarged, symmetric, no tenderness/mass/nodules Lymph nodes: Cervical, supraclavicular, and axillary nodes normal. Resp: clear to auscultation bilaterally Back: symmetric, no curvature. ROM normal. No CVA tenderness. Cardio: regular rate and rhythm, S1, S2 normal, no murmur, click, rub or gallop GI: soft, non-tender; bowel sounds normal; no masses,  no organomegaly Extremities: extremities normal, atraumatic, no cyanosis or edema Neurologic: Alert and oriented X 3, normal strength and tone. Normal symmetric reflexes. Normal coordination and gait  ECOG PERFORMANCE STATUS: 1 - Symptomatic but completely ambulatory  Blood pressure 119/62, pulse 93, temperature 98.1 F (36.7 C), temperature source Oral, resp. rate 18, height 5\' 8"  (1.727 m), weight 185 lb 6.4 oz (84.097 kg), SpO2 96 %.  LABORATORY DATA: Lab Results  Component Value Date   WBC 5.1 01/11/2015   HGB 11.9* 01/11/2015   HCT 37.0* 01/11/2015   MCV 85.8 01/11/2015   PLT 256 01/11/2015       Chemistry      Component Value Date/Time   NA 142 01/11/2015 0726   K 3.6 01/11/2015 0726   CL 105 01/11/2015 0726   CO2 32 01/11/2015 0726   BUN 10 01/11/2015 0726   CREATININE 0.93 01/11/2015 0726   CREATININE 0.88 12/29/2014 1207      Component Value Date/Time   CALCIUM 8.9 01/11/2015 0726   ALKPHOS 106 01/11/2015 0726   AST 26 01/11/2015 0726   ALT 14 01/11/2015 0726   BILITOT 1.1 01/11/2015 0726       RADIOGRAPHIC STUDIES: Dg Neck Soft Tissue  12/29/2014   CLINICAL DATA:  Hoarseness.  EXAM: NECK SOFT TISSUES - 1+ VIEW  COMPARISON:  Chest radiographs obtained at the same time and on 11/27/2006.  FINDINGS: The soft tissues of the neck have normal appearances. Cervical spine degenerative changes are noted. Increased in scratch increased density in the left hemithorax will be discussed on the chest radiographs report.  IMPRESSION: 1. And normal appearing neck soft tissues and airway. 2. Cervical spine degenerative changes. 3. Increased density in the left hemothorax, discussed in the chest radiographs report.   Electronically Signed   By: Enrique Sack M.D.   On: 12/29/2014 11:28   Dg Chest 2 View  12/29/2014   CLINICAL DATA:  Hoarseness.  Shortness of breath.  EXAM: CHEST  2 VIEW  COMPARISON:  11/27/2006  FINDINGS: New left hilar mass is seen with postobstructive atelectasis involving the left upper lobe. No evidence of pleural effusion. Right lung remains clear. Heart size is normal.  IMPRESSION: New left hilar mass with postobstructive left upper lobe atelectasis. This is highly suspicious for bronchogenic carcinoma. Chest CT with contrast recommended for further evaluation.  These results will be called to the ordering clinician or representative by the Radiologist Assistant, and communication documented in the PACS or zVision Dashboard.   Electronically Signed   By: Earle Gell M.D.   On: 12/29/2014 11:28   Ct Chest W Contrast  01/02/2015   CLINICAL DATA:  Left hilar mass. Occasional  productive cough for 5 weeks. Shortness of breath.  EXAM: CT CHEST WITH CONTRAST  TECHNIQUE: Multidetector CT imaging of the chest was performed during intravenous contrast administration.  CONTRAST:  75 cc Omnipaque 300 IV.  COMPARISON:  Chest x-ray 12/29/2014.  FINDINGS: There is a large necrotic-appearing left hilar and suprahilar mass with associated left upper lobe collapse. It is difficult to measure due to the collapsed left upper lobe. Central left scratch has central low-density area measures approximately 4.5 x 2.8 cm on image 27 anteriorly in the left hilum/suprahilar region. The left upper lobe bronchus occludes proximally.  Mild to moderate centrilobular emphysema. No confluent opacity or nodule on the right. No pleural effusions.  Calcified left hilar and mediastinal lymph nodes compatible with old granulomatous disease. No mediastinal, hilar or axillary adenopathy. Heart is normal size. Scattered coronary artery calcifications. Aorta is slightly dilated, 4 cm in the ascending thoracic aorta.  No acute bony abnormality or focal bone lesion. Imaging into the upper abdomen shows no acute findings. Small low-density lesions centrally within the spleen measuring 12 mm, most likely cyst or hemangioma.  IMPRESSION: Occlusion of the left upper lobe bronchus with necrotic appearing left hilar/ suprahilar mass and complete collapse of the left upper lobe. 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  Coronary artery disease.  Mild to moderate emphysema.   Electronically Signed   By: Rolm Baptise M.D.   On: 01/02/2015 09:54   Dg Chest Port 1 View  01/11/2015   CLINICAL DATA:  Lung cancer, hypertension, atrial fibrillation, MI, former smoker  EXAM: PORTABLE CHEST - 1 VIEW  COMPARISON:  12/29/2014; correlation with interval CT chest 01/02/2015  FINDINGS: Enlargement of cardiac silhouette.  Again identified LEFT upper lobe volume loss and central opacity with corresponding to known tumor on CT.   Associated pleural thickening/fluid at LEFT apex.  Elevation of LEFT diaphragm.  Extension of opacity into LEFT hilum which appears enlarged.  Deviation of trachea and mediastinum LEFT to RIGHT similar to prior CT.  RIGHT lung clear.  No pneumothorax.  Bones demineralized.  IMPRESSION: LEFT perihilar tumor and LEFT hilar enlargement with subtotal atelectasis of LEFT upper lobe and overall volume loss in the LEFT hemi thorax.  No new abnormalities.   Electronically Signed   By: Lavonia Dana M.D.   On: 01/11/2015 12:45    ASSESSMENT AND PLAN: This is a very pleasant 81 years old white male recently diagnosed with locally advanced non-small cell lung cancer, poorly differentiated adenocarcinoma pending further staging workup. I had a lengthy discussion with the patient and his family today about his current disease condition and treatment options. I recommended for the patient to complete the staging workup and I personally contacted the radiology department to move his scan sooner. If he has  no evidence of metastatic disease, the patient would be treated with a course of concurrent chemoradiation with weekly carboplatin and paclitaxel. I would see him back for follow-up visit in one week for reevaluation and more detailed discussion of his treatment options based on the final staging workup. He was advised to call immediately if he has any concerning symptoms in the interval. The patient voices understanding of current disease status and treatment options and is in agreement with the current care plan.  All questions were answered. The patient knows to call the clinic with any problems, questions or concerns. We can certainly see the patient much sooner if necessary.  I spent 20 minutes counseling the patient face to face. The total time spent in the appointment was 30 minutes.  Disclaimer: This note was dictated with voice recognition software. Similar sounding words can inadvertently be transcribed and  may not be corrected upon review.

## 2015-01-18 ENCOUNTER — Telehealth: Payer: Self-pay | Admitting: Medical Oncology

## 2015-01-18 ENCOUNTER — Ambulatory Visit: Payer: Self-pay | Admitting: Internal Medicine

## 2015-01-18 ENCOUNTER — Encounter (HOSPITAL_COMMUNITY): Admission: RE | Admit: 2015-01-18 | Payer: Medicare Other | Source: Ambulatory Visit

## 2015-01-18 ENCOUNTER — Telehealth: Payer: Self-pay

## 2015-01-18 NOTE — Telephone Encounter (Signed)
Appt information given to [facility].

## 2015-01-18 NOTE — Telephone Encounter (Signed)
I left message for Luke Contreras and Luke Contreras to call me back.

## 2015-01-18 NOTE — Telephone Encounter (Signed)
Pt has 2 charts, IT notified to combine them

## 2015-01-21 ENCOUNTER — Encounter (HOSPITAL_COMMUNITY): Payer: Self-pay

## 2015-01-21 ENCOUNTER — Ambulatory Visit (HOSPITAL_COMMUNITY)
Admission: RE | Admit: 2015-01-21 | Discharge: 2015-01-21 | Disposition: A | Discharge status: Home / Self Care | Payer: Medicare Other | Source: Ambulatory Visit | Attending: Internal Medicine | Admitting: Internal Medicine

## 2015-01-21 DIAGNOSIS — I10 Essential (primary) hypertension: Secondary | ICD-10-CM | POA: Insufficient documentation

## 2015-01-21 DIAGNOSIS — R918 Other nonspecific abnormal finding of lung field: Secondary | ICD-10-CM

## 2015-01-21 DIAGNOSIS — C3492 Malignant neoplasm of unspecified part of left bronchus or lung: Secondary | ICD-10-CM | POA: Diagnosis not present

## 2015-01-21 MED ORDER — GADOBENATE DIMEGLUMINE 529 MG/ML IV SOLN
15.0000 mL | Freq: Once | INTRAVENOUS | Status: AC | PRN
  Administered 2015-01-21: 15 mL via INTRAVENOUS

## 2015-01-22 ENCOUNTER — Other Ambulatory Visit: Payer: Self-pay | Admitting: *Deleted

## 2015-01-22 ENCOUNTER — Encounter: Payer: Self-pay | Admitting: *Deleted

## 2015-01-22 ENCOUNTER — Telehealth: Payer: Self-pay | Admitting: *Deleted

## 2015-01-22 NOTE — Telephone Encounter (Signed)
PET scan report from [facility] given to Dr Vista Mink to review.  Dr Vista Mink can view PET image from his computer

## 2015-01-22 NOTE — [facility] Oncology Navigator Note (Unsigned)
Called pathology dept to get an update on ALK.  Tammy will call back.

## 2015-01-23 ENCOUNTER — Encounter: Payer: Self-pay | Admitting: Radiation Oncology

## 2015-01-23 ENCOUNTER — Other Ambulatory Visit: Payer: Self-pay | Admitting: Radiation Therapy

## 2015-01-23 DIAGNOSIS — C7931 Secondary malignant neoplasm of brain: Secondary | ICD-10-CM

## 2015-01-23 NOTE — Progress Notes (Signed)
  Radiation Oncology         [phone] ________________________________  Name: Luke Contreras  MRN: [id number]  Date: 01/23/2015  DOB: Oct 31, 1930  Chart Note:  I reviewed this patient's most recent findings and wanted to take a minute to document my impression.  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  Impression:  In light of this information, he may need consultation with radiation oncology for consideration of SRS  Plan:  At this point, the patient will be set up for rad-onc consult.  ________________________________  Sheral Apley. Tammi Klippel, M.D.

## 2015-01-24 ENCOUNTER — Ambulatory Visit (HOSPITAL_BASED_OUTPATIENT_CLINIC_OR_DEPARTMENT_OTHER): Payer: Medicare Other | Admitting: Physician Assistant

## 2015-01-24 ENCOUNTER — Encounter (HOSPITAL_COMMUNITY): Payer: Self-pay

## 2015-01-24 ENCOUNTER — Telehealth: Payer: Self-pay | Admitting: Physician Assistant

## 2015-01-24 ENCOUNTER — Encounter: Payer: Self-pay | Admitting: Physician Assistant

## 2015-01-24 ENCOUNTER — Other Ambulatory Visit (HOSPITAL_BASED_OUTPATIENT_CLINIC_OR_DEPARTMENT_OTHER): Payer: Medicare Other

## 2015-01-24 VITALS — BP 130/77 | HR 94 | Temp 97.9°F | Resp 20 | Ht 70.0 in | Wt 183.5 lb

## 2015-01-24 DIAGNOSIS — C7951 Secondary malignant neoplasm of bone: Secondary | ICD-10-CM

## 2015-01-24 DIAGNOSIS — C7989 Secondary malignant neoplasm of other specified sites: Secondary | ICD-10-CM

## 2015-01-24 DIAGNOSIS — C3412 Malignant neoplasm of upper lobe, left bronchus or lung: Secondary | ICD-10-CM

## 2015-01-24 DIAGNOSIS — C787 Secondary malignant neoplasm of liver and intrahepatic bile duct: Secondary | ICD-10-CM

## 2015-01-24 DIAGNOSIS — C3492 Malignant neoplasm of unspecified part of left bronchus or lung: Secondary | ICD-10-CM

## 2015-01-24 LAB — COMPREHENSIVE METABOLIC PANEL (CC13)
ALT: 12 U/L (ref 0–55)
AST: 24 U/L (ref 5–34)
Albumin: 2.9 g/dL — ABNORMAL LOW (ref 3.5–5.0)
Alkaline Phosphatase: 127 U/L (ref 40–150)
Anion Gap: 10 mEq/L (ref 3–11)
BILIRUBIN TOTAL: 0.77 mg/dL (ref 0.20–1.20)
BUN: 10.7 mg/dL (ref 7.0–26.0)
CHLORIDE: 105 meq/L (ref 98–109)
CO2: 27 mEq/L (ref 22–29)
CREATININE: 0.8 mg/dL (ref 0.7–1.3)
Calcium: 8.9 mg/dL (ref 8.4–10.4)
EGFR: 81 mL/min/{1.73_m2} — AB (ref >90)
Glucose: 144 mg/dl — ABNORMAL HIGH (ref 70–140)
Potassium: 4.1 mEq/L (ref 3.5–5.1)
Sodium: 142 mEq/L (ref 136–145)
Total Protein: 6.6 g/dL (ref 6.4–8.3)

## 2015-01-24 LAB — CBC WITH DIFFERENTIAL/PLATELET
BASO%: 0.1 % (ref 0.0–2.0)
Basophils Absolute: 0 10*3/uL (ref 0.0–0.1)
EOS%: 3.5 % (ref 0.0–7.0)
Eosinophils Absolute: 0.3 10*3/uL (ref 0.0–0.5)
HCT: 39.3 % (ref 38.4–49.9)
HGB: 12.5 g/dL — ABNORMAL LOW (ref 13.0–17.1)
LYMPH%: 20.4 % (ref 14.0–49.0)
MCH: 28.1 pg (ref 27.2–33.4)
MCHC: 31.8 g/dL — ABNORMAL LOW (ref 32.0–36.0)
MCV: 88.3 fL (ref 79.3–98.0)
MONO#: 0.7 10*3/uL (ref 0.1–0.9)
MONO%: 9.5 % (ref 0.0–14.0)
NEUT#: 4.7 10*3/uL (ref 1.5–6.5)
NEUT%: 66.5 % (ref 39.0–75.0)
PLATELETS: 254 10*3/uL (ref 140–400)
RBC: 4.45 10*6/uL (ref 4.20–5.82)
RDW: 14.3 % (ref 11.0–14.6)
WBC: 7.1 10*3/uL (ref 4.0–10.3)
lymph#: 1.4 10*3/uL (ref 0.9–3.3)

## 2015-01-24 MED ORDER — PROCHLORPERAZINE MALEATE 10 MG PO TABS: 10.0000 mg | ORAL_TABLET | Freq: Four times a day (QID) | ORAL | Status: AC | PRN

## 2015-01-24 MED ORDER — DEXAMETHASONE 4 MG PO TABS: ORAL_TABLET | ORAL | Status: AC

## 2015-01-24 MED ORDER — FOLIC ACID 1 MG PO TABS: 1.0000 mg | ORAL_TABLET | Freq: Every day | ORAL | Status: DC

## 2015-01-24 MED ORDER — CYANOCOBALAMIN 1000 MCG/ML IJ SOLN
1000.0000 ug | Freq: Once | INTRAMUSCULAR | Status: AC
  Administered 2015-01-24: 1000 ug via INTRAMUSCULAR

## 2015-01-24 NOTE — Progress Notes (Addendum)
[place] [phone]   [phone] Multidisciplinary thoracic oncology clinic  OFFICE PROGRESS NOTE  Myriam Jacobson, [place], [place] [address]  DIAGNOSIS: Locally advanced non-small cell lung cancer, poorly differentiated adenocarcinoma diagnosed in January 2016. Staging workup is still pending.  PRIOR THERAPY: None  CURRENT THERAPY: None  INTERVAL HISTORY: Luke Contreras 81 y.o. male returns to the clinic today for follow-up visit accompanied by his wife. The patient is feeling fine today with no specific complaints except for the baseline shortness of breath in addition to hoarseness of his voice. Marland KitchenHe denied having any significant weight loss or night sweats. He has no chest pain but continues to have shortness breath with exertion with mild cough with no hemoptysis. The patient denied having any significant nausea or vomiting. He completed his staging workup with a PET scan performed at [facility] as well as a MRI of the brain with and without contrast that was performed at [facility]. The MRI of the brain revealed 1 ring-enhancing small lesion and the PET scan revealed disease within the left upper lobe with  metastatic disease in the contralateral upper lobe, the right first rib, liver and spleen. He presents to discuss treatment options  MEDICAL HISTORY: Past Medical History  Diagnosis Date  . History of acute anterior wall myocardial infarction 11/2006  . Atrial fibrillation   . Dyslipidemia   . Hypertension   . Coronary artery disease     history of  obstructive single-valve disease  . PAC (premature atrial contraction)   . Left ventricular dysfunction     withEF of 40-45%   . Cancer     skin cancer- side of nausea  . Complication of anesthesia   . Shortness of breath dyspnea     " when talking and completing a long sentence  . GERD (gastroesophageal reflux disease)   .  Arthritis     hands, knees    ALLERGIES:  is allergic to cephalexin and aspirin.  MEDICATIONS:  Current Outpatient Prescriptions  Medication Sig Dispense Refill  . alum & mag hydroxide-simeth (MAALOX/MYLANTA) 200-200-20 MG/5ML suspension Take by mouth every 6 (six) hours as needed for indigestion or heartburn.    Marland Kitchen aspirin 325 MG EC tablet Take 325 mg by mouth daily.    Marland Kitchen atorvastatin (LIPITOR) 40 MG tablet Take 0.5 tablets (20 mg total) by mouth daily. 45 tablet 0  . clorazepate (TRANXENE) 7.5 MG tablet Take 7.5 mg by mouth at bedtime.      . Cyanocobalamin (VITAMIN B-12 PO) Take 500 mcg by mouth daily.     Marland Kitchen Dextromethorphan-Guaifenesin (CORICIDIN HBP CONGESTION/COUGH PO) Take by mouth daily as needed.    . fluorouracil (EFUDEX) 5 % cream Apply 1 application topically daily.   0  . lisinopril (PRINIVIL,ZESTRIL) 2.5 MG tablet Take 1 tablet (2.5 mg total) by mouth daily. 90 tablet 0  . Misc Natural Products (OSTEO BI-FLEX JOINT SHIELD PO) Take 1 tablet by mouth daily.      . multivitamin (THERAGRAN) per tablet Take 1 tablet by mouth daily.      Marland Kitchen NITROSTAT 0.4 MG SL tablet Place 0.4 mg under the tongue every 5 (five) minutes as needed for chest pain.     Marland Kitchen omeprazole (PRILOSEC) 20 MG capsule Take 20 mg by mouth daily.     Marland Kitchen dexamethasone (DECADRON) 4 MG tablet Take 1 tablet by mouth twice daily with food the day  before, the day of and the day after chemotherapy 30 tablet 1  . folic acid (FOLVITE) 1 MG tablet Take 1 tablet (1 mg total) by mouth daily. 30 tablet 3  . prochlorperazine (COMPAZINE) 10 MG tablet Take 1 tablet (10 mg total) by mouth every 6 (six) hours as needed for nausea or vomiting. 30 tablet 1   No current facility-administered medications for this visit.    SURGICAL HISTORY:  Past Surgical History  Procedure Laterality Date  . Cardiac catheterization  11/26/2006    Est. EF of 40% --  A 100% obstructive proximal/mid left anterior descending artery consistent with recent  anterior myocardial infarction --  Moderate mid right coronary artery disease --Mild left ventricular dysfunction with EF of 40%  with distal anterior akinesis and periapical dyskinesis    -- Kaylyn Lim., M.D.  . Colonoscopy    . Video bronchoscopy with endobronchial ultrasound N/A 01/11/2015    Procedure: VIDEO BRONCHOSCOPY WITH ENDOBRONCHIAL ULTRASOUND;  Surgeon: Grace Isaac, MD;  Location: [place];  Service: Thoracic;  Laterality: N/A;  . Lung biopsy N/A 01/11/2015    Procedure: LUNG BIOPSY;  Surgeon: Grace Isaac, MD;  Location: [place];  Service: Thoracic;  Laterality: N/A;    REVIEW OF SYSTEMS:  Constitutional: negative Eyes: negative Ears, nose, mouth, throat, and face: positive for hoarseness Respiratory: positive for cough and dyspnea on exertion Cardiovascular: negative Gastrointestinal: negative Genitourinary:negative Integument/breast: negative Hematologic/lymphatic: negative Musculoskeletal:negative Neurological: negative Behavioral/Psych: negative Endocrine: negative Allergic/Immunologic: negative   PHYSICAL EXAMINATION: General appearance: alert, cooperative, fatigued and no distress Head: Normocephalic, without obvious abnormality, atraumatic Neck: no adenopathy, no JVD, supple, symmetrical, trachea midline and thyroid not enlarged, symmetric, no tenderness/mass/nodules Lymph nodes: Cervical, supraclavicular, and axillary nodes normal. Resp: clear to auscultation bilaterally Back: symmetric, no curvature. ROM normal. No CVA tenderness. Cardio: regular rate and rhythm, S1, S2 normal, no murmur, click, rub or gallop GI: soft, non-tender; bowel sounds normal; no masses,  no organomegaly Extremities: extremities normal, atraumatic, no cyanosis or edema Neurologic: Alert and oriented X 3, normal strength and tone. Normal symmetric reflexes. Normal coordination and gait  ECOG PERFORMANCE STATUS: 1 - Symptomatic but completely ambulatory  Blood pressure  130/77, pulse 94, temperature 97.9 F (36.6 C), temperature source Oral, resp. rate 20, height _0  (1.778 m), weight 183 lb 8 oz (83.235 kg).  LABORATORY DATA: Lab Results  Component Value Date   WBC 7.1 01/24/2015   HGB 12.5* 01/24/2015   HCT 39.3 01/24/2015   MCV 88.3 01/24/2015   PLT 254 01/24/2015      Chemistry      Component Value Date/Time   NA 142 01/24/2015 1410   NA 142 01/11/2015 0726   K 4.1 01/24/2015 1410   K 3.6 01/11/2015 0726   CL 105 01/11/2015 0726   CO2 27 01/24/2015 1410   CO2 32 01/11/2015 0726   BUN 10.7 01/24/2015 1410   BUN 10 01/11/2015 0726   CREATININE 0.8 01/24/2015 1410   CREATININE 0.93 01/11/2015 0726   CREATININE 0.88 12/29/2014 1207      Component Value Date/Time   CALCIUM 8.9 01/24/2015 1410   CALCIUM 8.9 01/11/2015 0726   ALKPHOS 127 01/24/2015 1410   ALKPHOS 106 01/11/2015 0726   AST 24 01/24/2015 1410   AST 26 01/11/2015 0726   ALT 12 01/24/2015 1410   ALT 14 01/11/2015 0726   BILITOT 0.77 01/24/2015 1410   BILITOT 1.1 01/11/2015 0726       RADIOGRAPHIC STUDIES:  Dg Neck Soft Tissue  12/29/2014   CLINICAL DATA:  Hoarseness.  EXAM: NECK SOFT TISSUES - 1+ VIEW  COMPARISON:  Chest radiographs obtained at the same time and on 11/27/2006.  FINDINGS: The soft tissues of the neck have normal appearances. Cervical spine degenerative changes are noted. Increased in scratch increased density in the left hemithorax will be discussed on the chest radiographs report.  IMPRESSION: 1. And normal appearing neck soft tissues and airway. 2. Cervical spine degenerative changes. 3. Increased density in the left hemothorax, discussed in the chest radiographs report.   Electronically Signed   By: Enrique Sack M.D.   On: 12/29/2014 11:28   Dg Chest 2 View  12/29/2014   CLINICAL DATA:  Hoarseness.  Shortness of breath.  EXAM: CHEST  2 VIEW  COMPARISON:  11/27/2006  FINDINGS: New left hilar mass is seen with postobstructive atelectasis involving the left  upper lobe. No evidence of pleural effusion. Right lung remains clear. Heart size is normal.  IMPRESSION: New left hilar mass with postobstructive left upper lobe atelectasis. This is highly suspicious for bronchogenic carcinoma. Chest CT with contrast recommended for further evaluation.  These results will be called to the ordering clinician or representative by the Radiologist Assistant, and communication documented in the PACS or zVision Dashboard.   Electronically Signed   By: Earle Gell M.D.   On: 12/29/2014 11:28   Ct Chest W Contrast  01/02/2015   CLINICAL DATA:  Left hilar mass. Occasional productive cough for 5 weeks. Shortness of breath.  EXAM: CT CHEST WITH CONTRAST  TECHNIQUE: Multidetector CT imaging of the chest was performed during intravenous contrast administration.  CONTRAST:  75 cc Omnipaque 300 IV.  COMPARISON:  Chest x-ray 12/29/2014.  FINDINGS: There is a large necrotic-appearing left hilar and suprahilar mass with associated left upper lobe collapse. It is difficult to measure due to the collapsed left upper lobe. Central left scratch has central low-density area measures approximately 4.5 x 2.8 cm on image 27 anteriorly in the left hilum/suprahilar region. The left upper lobe bronchus occludes proximally.  Mild to moderate centrilobular emphysema. No confluent opacity or nodule on the right. No pleural effusions.  Calcified left hilar and mediastinal lymph nodes compatible with old granulomatous disease. No mediastinal, hilar or axillary adenopathy. Heart is normal size. Scattered coronary artery calcifications. Aorta is slightly dilated, 4 cm in the ascending thoracic aorta.  No acute bony abnormality or focal bone lesion. Imaging into the upper abdomen shows no acute findings. Small low-density lesions centrally within the spleen measuring 12 mm, most likely cyst or hemangioma.  IMPRESSION: Occlusion of the left upper lobe bronchus with necrotic appearing left hilar/ suprahilar mass and  complete collapse of the left upper lobe. 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  Coronary artery disease.  Mild to moderate emphysema.   Electronically Signed   By: Rolm Baptise M.D.   On: 01/02/2015 09:54   Mr [name] GN Contrast  01/21/2015   CLINICAL DATA:  81 year old hypertensive male with history of lung cancer. Staging exam. Initial encounter.  EXAM: MRI HEAD WITHOUT AND WITH CONTRAST  TECHNIQUE: Multiplanar, multiecho pulse sequences of the brain and surrounding structures were obtained without and with intravenous contrast.  CONTRAST:  75m MULTIHANCE GADOBENATE DIMEGLUMINE 529 MG/ML IV SOLN  COMPARISON:  None.  FINDINGS: No acute infarct.  No intracranial hemorrhage.  8 mm ring-like enhancing lesion bordering the posterior aspect of the right temporal horn of the right lateral ventricle. This is  suspicious for solitary intracranial metastatic lesion given the patient's history. Infection, inflammation, demyelinating process or primary brain tumor felt to be less likely considerations.  Cervical spondylotic changes with spinal stenosis and Mild cord flattening C3-4 level.  Global age related atrophy without hydrocephalus.  Major intracranial vascular structures are patent.  Minimal to mild paranasal sinus mucosal thickening.  IMPRESSION: 8 mm ring-like enhancing lesion bordering the posterior aspect of the right temporal horn of the right lateral ventricle. This is suspicious for solitary intracranial metastatic lesion given the patient's history.  Cervical spondylotic changes with spinal stenosis and mild cord flattening C3-4 level.  Please see above.   Electronically Signed   By: Chauncey Cruel M.D.   On: 01/21/2015 20:57   Dg Chest Port 1 View  01/11/2015   CLINICAL DATA:  Lung cancer, hypertension, atrial fibrillation, MI, former smoker  EXAM: PORTABLE CHEST - 1 VIEW  COMPARISON:  12/29/2014; correlation with interval CT chest 01/02/2015  FINDINGS: Enlargement of cardiac  silhouette.  Again identified LEFT upper lobe volume loss and central opacity with corresponding to known tumor on CT.  Associated pleural thickening/fluid at LEFT apex.  Elevation of LEFT diaphragm.  Extension of opacity into LEFT hilum which appears enlarged.  Deviation of trachea and mediastinum LEFT to RIGHT similar to prior CT.  RIGHT lung clear.  No pneumothorax.  Bones demineralized.  IMPRESSION: LEFT perihilar tumor and LEFT hilar enlargement with subtotal atelectasis of LEFT upper lobe and overall volume loss in the LEFT hemi thorax.  No new abnormalities.   Electronically Signed   By: Lavonia Dana M.D.   On: 01/11/2015 12:45   CLINICAL DATA: Initial treatment strategy for left upper lung mass.  EXAM: NUCLEAR MEDICINE PET SKULL BASE TO THIGH  TECHNIQUE: 11.9 mCi F-18 FDG was injected intravenously. Full-ring PET imaging was performed from the skull base to thigh after the radiotracer. CT data was obtained and used for attenuation correction and anatomic localization.  FASTING BLOOD GLUCOSE: Value: 93 mg/dl  COMPARISON: 01/02/2015  FINDINGS: NECK  Mildly asymmetric left tonsillar activity, maximum standard uptake value 4.7 as compared to the right side measuring 3.8. Physiologic activity in supraclavicular brown fat noted.  Chronic right maxillary sinusitis.  CHEST  Centrally necrotic left upper lobe mass with extensive postobstructive atelectasis and pneumonitis, maximum standard uptake value 19.0. This extends into the left hilum and occludes the left upper lobe airway faintly increased activity in the right hilum, maximum standard uptake value 3.4, background mediastinal activity is about 3.0. Accordingly this right hilar activity is borderline increased. The faintly increased right infrahilar nodal activity, 4.1 in maximum standard uptake value, suspicious for malignancy.  Hypermetabolic 7 mm nodule posteriorly in the right upper lobe, maximum standard  uptake value 3.6. Symmetric adrenal activity.  Emphysema noted. There is evidence of old granulomatous disease in the chest.  ABDOMEN/PELVIS  Hypermetabolic lesion in the dome of segment 4 a of the liver, maximum standard uptake value 4.8. Similar hypermetabolic lesion associated with a splenic hypodensity, maximum standard uptake value 5.4 a second splenic lesion inferiorly has a maximum standard uptake value of 3.8, compatible with small focal metastatic lesion.  Incidental infrarenal abdominal aortic aneurysm, 3.6 by 3.5 cm. Umbilical hernia contains adipose tissue.  SKELETON  Destructive right first rib lesion, maximum standard uptake value 16.7. Mild hypermetabolic activity associated with a fracture of the right posterolateral eleventh rib, maximum SUV 3.1. Probably benign.  IMPRESSION: 1. Large centrally necrotic hypermetabolic left upper lobe and hilar mass, with metastatic  disease to the contralateral upper lobe, right infrahilar and possibly right hilar nodes, right first rib, liver, and spleen. 2. Mildly asymmetric left tonsillar activity, probably physiologic rather than due to malignancy. 3. Chronic right maxillary sinusitis. 4. Emphysema. 5. 3.6 cm infrarenal abdominal aortic aneurysm. Recommend followup by [place] in 2 years. This recommendation follows ACR consensus guidelines: White Paper of the ACR Incidental Findings Committee II on Vascular Findings. J Am Coll Radiol 2013; 77:412-878. 6. Umbilical hernia contains adipose tissue.  Electronically Signed By: Sherryl Barters M.D. On: 01/18/2015 16:11  ASSESSMENT AND PLAN: This is a very pleasant 81 years old white male recently diagnosed with stage IV non-small cell lung cancer, poorly differentiated adenocarcinoma. He has a single 15m ringing like enhancing lesion bordering the posterior aspect of the right temporal horn of the right lateral ventricle. Patient will be seen by radiation oncology [facility]  [facility]. His PET scan revealed metastatic disease. He has the large centrally necrotic hypermetabolic left upper lobe and hilar mass with metastatic disease to the contralateral upper lobe, right infrahilar and possibly right hilar nodes, right first rib liver and spleen. The patient was discussed with and also seen by Dr. MJulien Nordmann We'll plan to treat him with systemic chemotherapy consisting of carboplatin for an AUC of 5 and Alimta at 500 mg/m given every 3 weeks. He will be given a B-12 injection 1000 g intramuscularly today. Prescriptions for folic acid 1 mg by mouth daily total of 30 with 3 refills, Compazine 10 mg by mouth every 6 hours as needed for nausea total 30 with 1 refill and dexamethasone 4 mg by mouth twice daily with food the day before, the day of, and the day after chemotherapy total 30 tablets with 1 refill was sent to his pharmacy of record via E scribe. He'll be scheduled for chemotherapy education class. The first cycle of chemotherapy is expected 02/04/2015 or 02/05/2015. We will have her return in 2 weeks for symptom management visit to see how he tolerated his first cycle of chemotherapy.   He was advised to call immediately if he has any concerning symptoms in the interval. The patient voices understanding of current disease status and treatment options and is in agreement with the current care plan.  All questions were answered. The patient knows to call the clinic with any problems, questions or concerns. We can certainly see the patient much sooner if necessary.  JCarlton AdamPA-C  ADDENDUM: Hematology/Oncology Attending: I had a face to face encounter with the patient. I recommended his care plan. This is a very pleasant 81years old white male who was recently diagnosed with metastatic non-small cell lung cancer, adenocarcinoma with negative EGFR mutation and negative ALK gene translocation. The recent MRI of the brain  showed a tiny brain lesion. The patient will be evaluated by Dr. MTammi Klippelfor consideration of stereotactic radiotherapy to this lesion. His PET scan also showed evidence for metastatic disease involving the liver, spleen and bone. I had a lengthy discussion with the patient and his wife today about his condition. I discussed with the patient his treatment options including palliative care and hospice referral versus consideration of systemic chemotherapy with carboplatin for AUC of 5 and Alimta 500 MG/M2 every 3 weeks. The patient is interested in proceeding with systemic chemotherapy. I discussed with him the adverse effect of this treatment including but not limited to alopecia, myelosuppression, nausea and vomiting, peripheral neuropathy, liver or renal dysfunction.  He is expected to start the first cycle of this treatment next week. He would come back for follow-up visit in 2 weeks for reevaluation and management of any adverse effect of his chemotherapy. The patient will receive vitamin B 12 injection today. We will also call his pharmacy was prescription for Compazine 10 mg by mouth every 6 hours as needed for nausea, Decadron 4 mg by mouth twice a day the day before, day of and day after the chemotherapy as well as folic acid 1 mg by mouth daily. He was advised to call immediately if he has any concerning symptoms in the interval.  Disclaimer: This note was dictated with voice recognition software. Similar sounding words can inadvertently be transcribed and may not be corrected upon review. Eilleen Kempf., MD 01/27/2015

## 2015-01-24 NOTE — Telephone Encounter (Signed)
Pt confirmed labs/ov per 02/11 POF, gave pt AVS.... KJ, sent msg to add chemo and per AJ ok to schedule on 02/23 due to pt has an apt on 02/22 with heart Dr..

## 2015-01-25 ENCOUNTER — Telehealth: Payer: Self-pay | Admitting: *Deleted

## 2015-01-25 ENCOUNTER — Ambulatory Visit (HOSPITAL_COMMUNITY): Payer: Medicare Other

## 2015-01-25 ENCOUNTER — Encounter: Payer: Self-pay | Admitting: *Deleted

## 2015-01-25 NOTE — Telephone Encounter (Signed)
Called to check on patient today.  He received news yesterday that he has extensive disease.  I asked how he was doing today.  He stated not well.  He spoke about the tissue diagnosis and I listened.  By the end of the conversation, he stated he felt better after taking about it more.  I stated he could call me if he needed.  I will notify CSW and have her follow up.

## 2015-01-25 NOTE — Telephone Encounter (Signed)
Per staff message, office note and POF I have scheduled appts. Advised scheduler of appts. JMW

## 2015-01-25 NOTE — Patient Instructions (Signed)
Keep your appointments with radiation oncology to address your solitary brain metastasis Keep your appointment for chemo education class Return as scheduled for your first cycle for chemotherapy Follow up as scheduled in 3 weeks

## 2015-01-25 NOTE — [facility] Oncology Navigator Note (Unsigned)
After speaking with patient today, I notified CSW to reach out to him for more assistance with emotional support.

## 2015-01-27 NOTE — Progress Notes (Signed)
Radiation Oncology         (336) [phone] ________________________________  Outpatient Follow-Up Visit  Name: Luke Contreras  MRN: [id number]  Date: 01/28/2015  DOB: Dec 23, 1929  ER:[id number], Sharol Given, MD  Lorene Dy, MD   REFERRING PHYSICIAN: Lorene Dy, MD  DIAGNOSIS: 81 year old gentleman with an 8 mm solitary brain metastasis from non-small cell lung cancer of the left upper lobe - Stage IV    ICD-9-CM ICD-10-CM   1. Non-small cell cancer of left lung 162.9 C34.92     HISTORY OF PRESENT ILLNESS::Luke Contreras is a 81 y.o. male who I met in [place] in January.  To review, he presented complaining of cold symptoms and hoarseness of his voice which started in mid January. There was also associated with lack of energy. He was seen by his primary care physician and chest x-ray was performed on 12/29/2014 and it showed new left hilar mass with possible postobstructive left upper lobe atelectasis highly suspicious for bronchogenic carcinoma. This was followed by CT scan of the chest with contrast on 01/02/2015 and it showed a large necrotic-appearing left hilar and suprahilar mass with associated left upper lobe collapse. It is difficult to measure due to the collapsed left upper lobe. Central left scratch has central low-density area measures approximately 4.5 x 2.8 cm on anteriorly in the left hilum/suprahilar region. The left upper lobe bronchus occludes proximally. These findings were concerning for central bronchogenic carcinoma.  The patient underwent bronchoscopy with endobronchial ultrasound and transbronchial biopsy as well as brushing of the left upper lobe endobronchial mass. The final pathology was consistent with poorly differentiated adenocarcinoma.   PET CT was ordered on 01/10/15, but this has not been done or scheduled yet.  MRI of the brain was completed on 01/21/15 showing an 8 mm ring enhancing lesion in the right lateral ventricle.      He has been sent back to discuss  treatment of his brain metastasis.  PREVIOUS RADIATION THERAPY: No  PAST MEDICAL HISTORY:  has a past medical history of History of acute anterior wall myocardial infarction (11/2006); Atrial fibrillation; Dyslipidemia; Hypertension; Coronary artery disease; PAC (premature atrial contraction); Left ventricular dysfunction; Cancer; Complication of anesthesia; Shortness of breath dyspnea; GERD (gastroesophageal reflux disease); and Arthritis.    PAST SURGICAL HISTORY: Past Surgical History  Procedure Laterality Date  . Cardiac catheterization  11/26/2006    Est. EF of 40% --  A 100% obstructive proximal/mid left anterior descending artery consistent with recent anterior myocardial infarction --  Moderate mid right coronary artery disease --Mild left ventricular dysfunction with EF of 40%  with distal anterior akinesis and periapical dyskinesis    -- Kaylyn Lim., M.D.  . Colonoscopy    . Video bronchoscopy with endobronchial ultrasound N/A 01/11/2015    Procedure: VIDEO BRONCHOSCOPY WITH ENDOBRONCHIAL ULTRASOUND;  Surgeon: Grace Isaac, MD;  Location: [facility] OR;  Service: Thoracic;  Laterality: N/A;  . Lung biopsy N/A 01/11/2015    Procedure: LUNG BIOPSY;  Surgeon: Grace Isaac, MD;  Location: [place];  Service: Thoracic;  Laterality: N/A;    FAMILY HISTORY: family history includes Heart attack (age of onset: 17) in his father.  SOCIAL HISTORY:  reports that he quit smoking about 36 years ago. His smoking use included Cigarettes. He has a 45 pack-year smoking history. He has never used smokeless tobacco. He reports that he does not drink alcohol or use illicit drugs.  ALLERGIES: Cephalexin and Aspirin  MEDICATIONS:  Current Outpatient Prescriptions  Medication Sig Dispense Refill  . alum & mag hydroxide-simeth (MAALOX/MYLANTA) 200-200-20 MG/5ML suspension Take by mouth every 6 (six) hours as needed for indigestion or heartburn.    Marland Kitchen aspirin 325 MG EC tablet Take 325 mg by mouth  daily.    Marland Kitchen atorvastatin (LIPITOR) 40 MG tablet Take 0.5 tablets (20 mg total) by mouth daily. 45 tablet 0  . clorazepate (TRANXENE) 7.5 MG tablet Take 7.5 mg by mouth at bedtime.      . Cyanocobalamin (VITAMIN B-12 PO) Take 500 mcg by mouth daily.     Marland Kitchen dexamethasone (DECADRON) 4 MG tablet Take 1 tablet by mouth twice daily with food the day before, the day of and the day after chemotherapy 30 tablet 1  . Dextromethorphan-Guaifenesin (CORICIDIN HBP CONGESTION/COUGH PO) Take by mouth daily as needed.    . fluorouracil (EFUDEX) 5 % cream Apply 1 application topically daily.   0  . folic acid (FOLVITE) 1 MG tablet Take 1 tablet (1 mg total) by mouth daily. 30 tablet 3  . lisinopril (PRINIVIL,ZESTRIL) 2.5 MG tablet Take 1 tablet (2.5 mg total) by mouth daily. 90 tablet 0  . Misc Natural Products (OSTEO BI-FLEX JOINT SHIELD PO) Take 1 tablet by mouth daily.      . multivitamin (THERAGRAN) per tablet Take 1 tablet by mouth daily.      Marland Kitchen NITROSTAT 0.4 MG SL tablet Place 0.4 mg under the tongue every 5 (five) minutes as needed for chest pain.     Marland Kitchen omeprazole (PRILOSEC) 20 MG capsule Take 20 mg by mouth daily.     . prochlorperazine (COMPAZINE) 10 MG tablet Take 1 tablet (10 mg total) by mouth every 6 (six) hours as needed for nausea or vomiting. 30 tablet 1   No current facility-administered medications for this encounter.    REVIEW OF SYSTEMS:  A 15 point review of systems is documented in the electronic medical record. This was obtained by the nursing staff. However, I reviewed this with the patient to discuss relevant findings and make appropriate changes.  A comprehensive review of systems was negative.   PHYSICAL EXAM:  height is _0  (1.753 m) and weight is 183 lb 11.2 oz (83.326 kg). His oral temperature is 97.8 F (36.6 C). His blood pressure is 122/70 and his pulse is 88. His respiration is 16 and oxygen saturation is 96%.   The patient is a frail-appearing elderly gentleman in no distress.  He is alert and oriented. his speech is fluent and articulate. Gait is normal. Mood and affect are appropriate.  KPS = 90  100 - Normal; no complaints; no evidence of disease. 90   - Able to carry on normal activity; minor signs or symptoms of disease. 80   - Normal activity with effort; some signs or symptoms of disease. 61   - Cares for self; unable to carry on normal activity or to do active work. 60   - Requires occasional assistance, but is able to care for most of his personal needs. 50   - Requires considerable assistance and frequent medical care. 41   - Disabled; requires special care and assistance. 74   - Severely disabled; hospital admission is indicated although death not imminent. 44   - Very sick; hospital admission necessary; active supportive treatment necessary. 10   - Moribund; fatal processes progressing rapidly. 0     - Dead  Karnofsky DA, Abelmann WH, Craver LS and [facility] [phone]) The use of the nitrogen mustards  in the palliative treatment of carcinoma: with particular reference to bronchogenic carcinoma Cancer 1 634-56  LABORATORY DATA:  Lab Results  Component Value Date   WBC 7.1 01/24/2015   HGB 12.5* 01/24/2015   HCT 39.3 01/24/2015   MCV 88.3 01/24/2015   PLT 254 01/24/2015   Lab Results  Component Value Date   NA 142 01/24/2015   K 4.1 01/24/2015   CL 105 01/11/2015   CO2 27 01/24/2015   Lab Results  Component Value Date   ALT 12 01/24/2015   AST 24 01/24/2015   ALKPHOS 127 01/24/2015   BILITOT 0.77 01/24/2015     RADIOGRAPHY: Ct Chest W Contrast  01/02/2015   CLINICAL DATA:  Left hilar mass. Occasional productive cough for 5 weeks. Shortness of breath.  EXAM: CT CHEST WITH CONTRAST  TECHNIQUE: Multidetector CT imaging of the chest was performed during intravenous contrast administration.  CONTRAST:  75 cc Omnipaque 300 IV.  COMPARISON:  Chest x-ray 12/29/2014.  FINDINGS: There is a large necrotic-appearing left hilar and suprahilar mass  with associated left upper lobe collapse. It is difficult to measure due to the collapsed left upper lobe. Central left scratch has central low-density area measures approximately 4.5 x 2.8 cm on image 27 anteriorly in the left hilum/suprahilar region. The left upper lobe bronchus occludes proximally.  Mild to moderate centrilobular emphysema. No confluent opacity or nodule on the right. No pleural effusions.  Calcified left hilar and mediastinal lymph nodes compatible with old granulomatous disease. No mediastinal, hilar or axillary adenopathy. Heart is normal size. Scattered coronary artery calcifications. Aorta is slightly dilated, 4 cm in the ascending thoracic aorta.  No acute bony abnormality or focal bone lesion. Imaging into the upper abdomen shows no acute findings. Small low-density lesions centrally within the spleen measuring 12 mm, most likely cyst or hemangioma.  IMPRESSION: Occlusion of the left upper lobe bronchus with necrotic appearing left hilar/ suprahilar mass and complete collapse of the left upper lobe. 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  Coronary artery disease.  Mild to moderate emphysema.   Electronically Signed   By: Rolm Baptise M.D.   On: 01/02/2015 09:54   Mr [name] TT Contrast  01/21/2015   CLINICAL DATA:  81 year old hypertensive male with history of lung cancer. Staging exam. Initial encounter.  EXAM: MRI HEAD WITHOUT AND WITH CONTRAST  TECHNIQUE: Multiplanar, multiecho pulse sequences of the brain and surrounding structures were obtained without and with intravenous contrast.  CONTRAST:  3m MULTIHANCE GADOBENATE DIMEGLUMINE 529 MG/ML IV SOLN  COMPARISON:  None.  FINDINGS: No acute infarct.  No intracranial hemorrhage.  8 mm ring-like enhancing lesion bordering the posterior aspect of the right temporal horn of the right lateral ventricle. This is suspicious for solitary intracranial metastatic lesion given the patient's history. Infection, inflammation,  demyelinating process or primary brain tumor felt to be less likely considerations.  Cervical spondylotic changes with spinal stenosis and Mild cord flattening C3-4 level.  Global age related atrophy without hydrocephalus.  Major intracranial vascular structures are patent.  Minimal to mild paranasal sinus mucosal thickening.  IMPRESSION: 8 mm ring-like enhancing lesion bordering the posterior aspect of the right temporal horn of the right lateral ventricle. This is suspicious for solitary intracranial metastatic lesion given the patient's history.  Cervical spondylotic changes with spinal stenosis and mild cord flattening C3-4 level.  Please see above.   Electronically Signed   By: SChauncey CruelM.D.   On: 01/21/2015 20:57   Dg  Chest Port 1 View  01/11/2015   CLINICAL DATA:  Lung cancer, hypertension, atrial fibrillation, MI, former smoker  EXAM: PORTABLE CHEST - 1 VIEW  COMPARISON:  12/29/2014; correlation with interval CT chest 01/02/2015  FINDINGS: Enlargement of cardiac silhouette.  Again identified LEFT upper lobe volume loss and central opacity with corresponding to known tumor on CT.  Associated pleural thickening/fluid at LEFT apex.  Elevation of LEFT diaphragm.  Extension of opacity into LEFT hilum which appears enlarged.  Deviation of trachea and mediastinum LEFT to RIGHT similar to prior CT.  RIGHT lung clear.  No pneumothorax.  Bones demineralized.  IMPRESSION: LEFT perihilar tumor and LEFT hilar enlargement with subtotal atelectasis of LEFT upper lobe and overall volume loss in the LEFT hemi thorax.  No new abnormalities.   Electronically Signed   By: Lavonia Dana M.D.   On: 01/11/2015 12:45      IMPRESSION: This patient is a very nice 81 year old woman with a solitary 8 mm brain metastasis from non-small cell lung cancer.  At this point, the patient would potentially benefit from radiotherapy. The options include whole brain irradiation versus stereotactic radiosurgery. There are pros and cons  associated with each of these potential treatment options. Whole brain radiotherapy would treat the known metastatic deposits and help provide some reduction of risk for future brain metastases. However, whole brain radiotherapy carries potential risks including hair loss, subacute somnolence, and neurocognitive changes including a possible reduction in short-term memory. Whole brain radiotherapy also may carry a lower likelihood of tumor control at the treatment sites because of the low-dose used. Stereotactic radiosurgery carries a higher likelihood for local tumor control at the targeted sites with lower associated risk for neurocognitive changes such as memory loss. However, the use of stereotactic radiosurgery in this setting may leave the patient at increased risk for new brain metastases elsewhere in the brain as high as 50-60%. Accordingly, patients who receive stereotactic radiosurgery in this setting should undergo ongoing surveillance imaging with brain MRI more frequently in order to identify and treat new small brain metastases before they become symptomatic. Stereotactic radiosurgery does carry some different risks, including a risk of radionecrosis.  PLAN: Today, I reviewed the findings and workup thus far with the patient. We discussed the dilemma regarding whole brain radiotherapy versus stereotactic radiosurgery. We discussed the pros and cons of each. We also discussed the logistics and delivery of each. We reviewed the results associated with each of the treatments described above. The patient seems to understand the treatment options and would like to proceed with stereotactic radiosurgery.  I spent 30 minutes minutes face to face with the patient and more than 50% of that time was spent in counseling and/or coordination of care.    ------------------------------------------------  Sheral Apley. Tammi Klippel, M.D.

## 2015-01-28 ENCOUNTER — Ambulatory Visit
Admission: RE | Admit: 2015-01-28 | Discharge: 2015-01-28 | Disposition: A | Discharge status: Home / Self Care | Payer: Medicare Other | Source: Ambulatory Visit | Attending: Radiation Oncology | Admitting: Radiation Oncology

## 2015-01-28 ENCOUNTER — Encounter: Payer: Self-pay | Admitting: Radiation Oncology

## 2015-01-28 ENCOUNTER — Encounter: Payer: Self-pay | Admitting: *Deleted

## 2015-01-28 VITALS — BP 122/70 | HR 88 | Temp 97.8°F | Resp 16 | Ht 69.0 in | Wt 183.7 lb

## 2015-01-28 DIAGNOSIS — Z87891 Personal history of nicotine dependence: Secondary | ICD-10-CM | POA: Diagnosis not present

## 2015-01-28 DIAGNOSIS — C7931 Secondary malignant neoplasm of brain: Secondary | ICD-10-CM

## 2015-01-28 DIAGNOSIS — C3492 Malignant neoplasm of unspecified part of left bronchus or lung: Secondary | ICD-10-CM | POA: Diagnosis not present

## 2015-01-28 NOTE — Progress Notes (Signed)
Patient presents today with wife of 38 years and son, Kasandra Knudsen. Patient reports intermittent episodes of nausea but, denies emesis. Reports sleeping without difficulty. Reports productive cough has improved greatly. Reports scant hemoptysis following biopsy. SOB with exertion noted. Hoarseness noted. Denies headaches, ringing in the ears, diplopia or floaters. Reports a mark decline in vision over the last three moths. Reports occasional dizziness with standing. Vitals stable. Denies weight loss or night sweats.

## 2015-01-28 NOTE — [facility] Oncology Navigator Note (Unsigned)
Spoke with Mr. Serna and his family today at [facility] appt.  I helped him apply for gas card applications. I faxed [employer] and Lung cancer inisiative applications  I spoke with Mont Dutton, brain navigator about treatment plan.  He is scheduled for SRS the same week he is getting chemotherapy.  I clarified with Dr. Julien Nordmann if this was ok and he stated yes.  I will notify Manuela Schwartz.

## 2015-01-28 NOTE — Progress Notes (Signed)
See progress note under physician encounter. 

## 2015-01-29 ENCOUNTER — Other Ambulatory Visit: Payer: Medicare Other

## 2015-01-29 ENCOUNTER — Ambulatory Visit (HOSPITAL_COMMUNITY): Payer: Medicare Other

## 2015-01-30 ENCOUNTER — Ambulatory Visit
Admission: RE | Admit: 2015-01-30 | Discharge: 2015-01-30 | Disposition: A | Discharge status: Home / Self Care | Payer: Medicare Other | Source: Ambulatory Visit | Attending: Radiation Oncology | Admitting: Radiation Oncology

## 2015-01-30 DIAGNOSIS — C7931 Secondary malignant neoplasm of brain: Secondary | ICD-10-CM

## 2015-01-30 MED ORDER — GADOBENATE DIMEGLUMINE 529 MG/ML IV SOLN
17.0000 mL | Freq: Once | INTRAVENOUS | Status: AC | PRN
  Administered 2015-01-30: 17 mL via INTRAVENOUS

## 2015-01-31 DIAGNOSIS — C7931 Secondary malignant neoplasm of brain: Secondary | ICD-10-CM | POA: Insufficient documentation

## 2015-01-31 NOTE — Progress Notes (Signed)
  Radiation Oncology         (336) [phone] ________________________________  Name: Luke Contreras  MRN: [id number]  Date: 02/01/2015  DOB: 1930/03/10  SIMULATION AND TREATMENT PLANNING NOTE    ICD-9-CM ICD-10-CM   1. Solitary 9 mm Rt Temporal Brain Metastasis 198.3 C79.31     DIAGNOSIS:  81 yo man with a solitary 9 mm brain metastasis from poorly differentiated adenocarcinoma of the left upper lung - Stage IV  NARRATIVE:  The patient was brought to the [facility].  Identity was confirmed.  All relevant records and images related to the planned course of therapy were reviewed.  The patient freely provided informed written consent to proceed with treatment after reviewing the details related to the planned course of therapy. The consent form was witnessed and verified by the simulation staff. Intravenous access was established for contrast administration.    BRAIN:  Then, the patient was set-up in a stable reproducible supine position for radiation therapy.  A relocatable thermoplastic stereotactic head frame was fabricated for precise immobilization.  CT images were obtained.  Surface markings were placed.  The CT images were loaded into the planning software and fused with the patient's targeting MRI scan.  Then the target and avoidance structures were contoured.  Treatment planning then occurred.  The radiation prescription was entered and confirmed.  I have requested 3D planning  I have requested a DVH of the following structures: Brain stem, brain, left eye, right eye, lenses, optic chiasm, target volumes, uninvolved brain, and normal tissue.     CHEST:  Then, the patient was set-up in a stable reproducible  supine position for radiation therapy.  CT images of the chest were obtained.  Surface markings were placed.  The CT images were loaded into the planning software.  Then the target and avoidance structures were contoured.  Treatment planning then occurred.  The radiation  prescription was entered and confirmed.  Then, I designed and supervised the construction of a total of 5 medically necessary complex treatment devices in the form of multileaf collimators to shape radiation around the target volume while shielding the heart uninvolved lungs and spinal cord.  I have requested : 3D Simulation  I have requested a DVH of the following structures: Target, heart, left lung, right lung, spinal cord and esophagus.  I have ordered:CBC  SPECIAL TREATMENT PROCEDURE:  The planned course of therapy using radiation constitutes a special treatment procedure. Special care is required in the management of this patient for the following reasons. This treatment constitutes a Special Treatment Procedure for the following reason: Concurrent chemotherapy requiring careful monitoring for increased toxicities of treatment including weekly laboratory values..  The special nature of the planned course of radiotherapy will require increased physician supervision and oversight to ensure patient's safety with optimal treatment outcomes.   PLAN:  The patient will receive 20 Gy in 1 fraction and 66 Gy in 33 fractions to the chest.  ________________________________  Sheral Apley. Tammi Klippel, M.D.

## 2015-02-01 ENCOUNTER — Ambulatory Visit
Admission: RE | Admit: 2015-02-01 | Discharge: 2015-02-01 | Disposition: A | Discharge status: Home / Self Care | Payer: Medicare Other | Source: Ambulatory Visit | Attending: Radiation Oncology | Admitting: Radiation Oncology

## 2015-02-01 DIAGNOSIS — C7931 Secondary malignant neoplasm of brain: Secondary | ICD-10-CM

## 2015-02-01 DIAGNOSIS — C3492 Malignant neoplasm of unspecified part of left bronchus or lung: Secondary | ICD-10-CM | POA: Diagnosis not present

## 2015-02-01 MED ORDER — SODIUM CHLORIDE 0.9 % IJ SOLN
10.0000 mL | Freq: Once | INTRAMUSCULAR | Status: AC
  Administered 2015-02-01: 10 mL via INTRAVENOUS

## 2015-02-01 NOTE — Progress Notes (Signed)
Patient accompanied by wife. Vitals stable.Denies pain. No distress noted. Without complaints today. Denies taking metformin, denies any allergies and denies having a pacemaker. BUN and creatinine from 2/11 WDL. Started left AC 22 gauge IV on the first attempt. Excellent blood return obtained. Patient tolerated well. Escorted to CT/SIM by RT.

## 2015-02-04 ENCOUNTER — Ambulatory Visit (INDEPENDENT_AMBULATORY_CARE_PROVIDER_SITE_OTHER): Payer: Medicare Other | Admitting: Cardiology

## 2015-02-04 ENCOUNTER — Encounter: Payer: Self-pay | Admitting: Cardiology

## 2015-02-04 VITALS — BP 104/60 | HR 98 | Ht 69.0 in | Wt 183.2 lb

## 2015-02-04 DIAGNOSIS — I519 Heart disease, unspecified: Secondary | ICD-10-CM

## 2015-02-04 DIAGNOSIS — C3492 Malignant neoplasm of unspecified part of left bronchus or lung: Secondary | ICD-10-CM

## 2015-02-04 DIAGNOSIS — I2510 Atherosclerotic heart disease of native coronary artery without angina pectoris: Secondary | ICD-10-CM

## 2015-02-04 DIAGNOSIS — I252 Old myocardial infarction: Secondary | ICD-10-CM

## 2015-02-04 MED ORDER — NITROGLYCERIN 0.4 MG SL SUBL: 0.4000 mg | SUBLINGUAL_TABLET | SUBLINGUAL | Status: AC | PRN

## 2015-02-04 NOTE — Patient Instructions (Signed)
Continue your current therapy  Good luck with your chemotherapy and radiation  I will see you in 6 months.

## 2015-02-04 NOTE — Progress Notes (Signed)
Luke Contreras Date of Birth: 02/17/1930   History of Present Illness: Luke Contreras is seen today for followup CAD. He has a history of an anterior myocardial infarction with unsuccessful reperfusion. He has occlusion of the LAD chronically with moderate left ventricular dysfunction. Ejection fraction is estimated at 40-45%. His last Myoview study in July of 2010 showed distal anterior and apical wall scar with ejection fraction 43%. He is intolerant to beta blockers because of chronic bradycardia.  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 He is going to have RT for the brain lesion and receive systemic chemotherapy. He does complain of hoarseness. He has some cough. He feels some mid chest pain at rest when he is lying down that is relieved with Mylanta. No exertional chest pain.   Current Outpatient Prescriptions on File Prior to Visit  Medication Sig Dispense Refill  . alum & mag hydroxide-simeth (MAALOX/MYLANTA) 200-200-20 MG/5ML suspension Take by mouth every 6 (six) hours as needed for indigestion or heartburn.    Marland Kitchen aspirin 325 MG EC tablet Take 325 mg by mouth daily.    Marland Kitchen atorvastatin (LIPITOR) 40 MG tablet Take 0.5 tablets (20 mg total) by mouth daily. 45 tablet 0  . clorazepate (TRANXENE) 7.5 MG tablet Take 7.5 mg by mouth at bedtime.      . Cyanocobalamin (VITAMIN B-12 PO) Take 500 mcg by mouth daily.     Marland Kitchen dexamethasone (DECADRON) 4 MG tablet Take 1 tablet by mouth twice daily with food the day before, the day of and the day after chemotherapy 30 tablet 1  . Dextromethorphan-Guaifenesin (CORICIDIN HBP CONGESTION/COUGH PO) Take by mouth daily as needed.    . fluorouracil (EFUDEX) 5 % cream Apply 1 application topically daily.   0  . folic acid (FOLVITE) 1 MG tablet Take 1 tablet (1 mg total) by mouth daily. 30 tablet 3  . lisinopril (PRINIVIL,ZESTRIL) 2.5 MG tablet Take 1 tablet (2.5 mg total) by mouth daily. 90  tablet 0  . Misc Natural Products (OSTEO BI-FLEX JOINT SHIELD PO) Take 1 tablet by mouth daily.      . multivitamin (THERAGRAN) per tablet Take 1 tablet by mouth daily.      Marland Kitchen omeprazole (PRILOSEC) 20 MG capsule Take 20 mg by mouth daily.     . prochlorperazine (COMPAZINE) 10 MG tablet Take 1 tablet (10 mg total) by mouth every 6 (six) hours as needed for nausea or vomiting. 30 tablet 1   No current facility-administered medications on file prior to visit.    Allergies  Allergen Reactions  . Cephalexin Other (See Comments)    Stomach pain  . Aspirin     Bother stomach    Past Medical History  Diagnosis Date  . History of acute anterior wall myocardial infarction 11/2006  . Atrial fibrillation   . Dyslipidemia   . Hypertension   . Coronary artery disease     history of  obstructive single-valve disease  . PAC (premature atrial contraction)   . Left ventricular dysfunction     withEF of 40-45%   . Cancer     skin cancer- side of nausea  . Complication of anesthesia   . Shortness of breath dyspnea     " when talking and completing a long sentence  . GERD (gastroesophageal reflux disease)   . Arthritis     hands, knees    Past Surgical History  Procedure Laterality Date  . Cardiac catheterization  11/26/2006    Est. EF of 40% --  A 100% obstructive proximal/mid left anterior descending artery consistent with recent anterior myocardial infarction --  Moderate mid right coronary artery disease --Mild left ventricular dysfunction with EF of 40%  with distal anterior akinesis and periapical dyskinesis    -- Kaylyn Lim., M.D.  . Colonoscopy    . Video bronchoscopy with endobronchial ultrasound N/A 01/11/2015    Procedure: VIDEO BRONCHOSCOPY WITH ENDOBRONCHIAL ULTRASOUND;  Surgeon: Grace Isaac, MD;  Location: [facility] OR;  Service: Thoracic;  Laterality: N/A;  . Lung biopsy N/A 01/11/2015    Procedure: LUNG BIOPSY;  Surgeon: Grace Isaac, MD;  Location: [place];  Service:  Thoracic;  Laterality: N/A;    History  Smoking status  . Former Smoker -- 1.50 packs/day for 30 years  . Types: Cigarettes  . Quit date: 12/14/1978  Smokeless tobacco  . Never Used    History  Alcohol Use No    Family History  Problem Relation Age of Onset  . Heart attack Father 73    Review of Systems: As noted in history of present illness. All other systems were reviewed and are negative.  Physical Exam: BP 104/60 mmHg  Pulse 98  Ht 5\' 9"  (1.753 m)  Wt 183 lb 3.2 oz (83.099 kg)  BMI 27.04 kg/m2 He is a pleasant white male in no acute distress. His HEENT exam is unremarkable. He is hoarse. He has no JVD or bruits. Lungs are clear. Cardiac exam reveals a regular rate and rhythm without gallop, murmur, or click. Abdomen is soft and nontender. He has no masses. Pedal pulses are 2+. He has no edema. His neurologic exam is nonfocal.  LABORATORY DATA: Lab Results  Component Value Date   WBC 7.1 01/24/2015   HGB 12.5* 01/24/2015   HCT 39.3 01/24/2015   PLT 254 01/24/2015   GLUCOSE 144* 01/24/2015   CHOL 91 04/03/2013   TRIG 58.0 04/03/2013   HDL 34.40* 04/03/2013   LDLCALC 45 04/03/2013   ALT 12 01/24/2015   AST 24 01/24/2015   NA 142 01/24/2015   K 4.1 01/24/2015   CL 105 01/11/2015   CREATININE 0.8 01/24/2015   BUN 10.7 01/24/2015   CO2 27 01/24/2015   INR 1.06 01/11/2015     Assessment / Plan: 1. Old anterior myocardial infarction with occlusion of the LAD. Patient is asymptomatic. Continue aspirin therapy and statin therapy. He is not a candidate for beta blockade because of bradycardia. Follow up in 6 months.  2. Hypercholesterolemia. On lipitor.   3. Chronic congestive heart failure with moderate left ventricular dysfunction. He is asymptomatic. Continue ACE inhibitor therapy.   4. Non small cell CA lung with brain, liver, spleen mets. Stage 4. Starting cranial radiation Rx and chemotherapy in the near future.

## 2015-02-05 ENCOUNTER — Encounter: Payer: Self-pay | Admitting: *Deleted

## 2015-02-05 ENCOUNTER — Ambulatory Visit (HOSPITAL_BASED_OUTPATIENT_CLINIC_OR_DEPARTMENT_OTHER): Payer: Medicare Other

## 2015-02-05 DIAGNOSIS — C7951 Secondary malignant neoplasm of bone: Secondary | ICD-10-CM

## 2015-02-05 DIAGNOSIS — C7889 Secondary malignant neoplasm of other digestive organs: Secondary | ICD-10-CM

## 2015-02-05 DIAGNOSIS — C7931 Secondary malignant neoplasm of brain: Secondary | ICD-10-CM

## 2015-02-05 DIAGNOSIS — C3492 Malignant neoplasm of unspecified part of left bronchus or lung: Secondary | ICD-10-CM

## 2015-02-05 DIAGNOSIS — C3412 Malignant neoplasm of upper lobe, left bronchus or lung: Secondary | ICD-10-CM

## 2015-02-05 DIAGNOSIS — Z5111 Encounter for antineoplastic chemotherapy: Secondary | ICD-10-CM

## 2015-02-05 DIAGNOSIS — C787 Secondary malignant neoplasm of liver and intrahepatic bile duct: Secondary | ICD-10-CM

## 2015-02-05 MED ORDER — SODIUM CHLORIDE 0.9 % IV SOLN
Freq: Once | INTRAVENOUS | Status: AC
  Administered 2015-02-05: 09:00:00 via INTRAVENOUS

## 2015-02-05 MED ORDER — SODIUM CHLORIDE 0.9 % IV SOLN
498.0000 mg/m2 | Freq: Once | INTRAVENOUS | Status: AC
  Administered 2015-02-05: 1000 mg via INTRAVENOUS
  Filled 2015-02-05: qty 40

## 2015-02-05 MED ORDER — ONDANSETRON 16 MG/50ML IVPB (CHCC)
INTRAVENOUS | Status: AC
  Filled 2015-02-05: qty 16

## 2015-02-05 MED ORDER — DEXAMETHASONE SODIUM PHOSPHATE 20 MG/5ML IJ SOLN
INTRAMUSCULAR | Status: AC
  Filled 2015-02-05: qty 5

## 2015-02-05 MED ORDER — ONDANSETRON 16 MG/50ML IVPB (CHCC)
16.0000 mg | Freq: Once | INTRAVENOUS | Status: AC
  Administered 2015-02-05: 16 mg via INTRAVENOUS

## 2015-02-05 MED ORDER — CARBOPLATIN CHEMO INJECTION 600 MG/60ML
460.0000 mg | Freq: Once | INTRAVENOUS | Status: AC
  Administered 2015-02-05: 460 mg via INTRAVENOUS
  Filled 2015-02-05: qty 46

## 2015-02-05 MED ORDER — DEXAMETHASONE SODIUM PHOSPHATE 20 MG/5ML IJ SOLN
20.0000 mg | Freq: Once | INTRAMUSCULAR | Status: AC
  Administered 2015-02-05: 20 mg via INTRAVENOUS

## 2015-02-05 NOTE — Patient Instructions (Signed)
[facility] Discharge Instructions for Patients Receiving Chemotherapy  Today you received the following chemotherapy agents Alimta/Carboplatin To help prevent nausea and vomiting after your treatment, we encourage you to take your nausea medication as prescribed.   If you develop nausea and vomiting that is not controlled by your nausea medication, call the clinic.   BELOW ARE SYMPTOMS THAT SHOULD BE REPORTED IMMEDIATELY:  *FEVER GREATER THAN 100.5 F  *CHILLS WITH OR WITHOUT FEVER  NAUSEA AND VOMITING THAT IS NOT CONTROLLED WITH YOUR NAUSEA MEDICATION  *UNUSUAL SHORTNESS OF BREATH  *UNUSUAL BRUISING OR BLEEDING  TENDERNESS IN MOUTH AND THROAT WITH OR WITHOUT PRESENCE OF ULCERS  *URINARY PROBLEMS  *BOWEL PROBLEMS  UNUSUAL RASH Items with * indicate a potential emergency and should be followed up as soon as possible.  Feel free to call the clinic you have any questions or concerns. The clinic phone number is [phone].    Pemetrexed injection (Alimta) What is this medicine? PEMETREXED (PEM e TREX ed) is a chemotherapy drug. This medicine affects cells that are rapidly growing, such as cancer cells and cells in your mouth and stomach. It is usually used to treat lung cancers like non-small cell lung cancer and mesothelioma. It may also be used to treat other cancers. This medicine may be used for other purposes; ask your health care provider or pharmacist if you have questions. COMMON BRAND NAME(S): Alimta What should I tell my health care provider before I take this medicine? They need to know if you have any of these conditions: -if you frequently drink alcohol containing beverages -infection (especially a virus infection such as chickenpox, cold sores, or herpes) -kidney disease -liver disease -low blood counts, like low platelets, red bloods, or white blood cells -an unusual or allergic reaction to pemetrexed, mannitol, other medicines, foods, dyes,  or preservatives -pregnant or trying to get pregnant -breast-feeding How should I use this medicine? This drug is given as an infusion into a vein. It is administered in a hospital or clinic by a specially trained health care professional. Talk to your pediatrician regarding the use of this medicine in children. Special care may be needed. Overdosage: If you think you have taken too much of this medicine contact a poison control center or emergency room at once. NOTE: This medicine is only for you. Do not share this medicine with others. What if I miss a dose? It is important not to miss your dose. Call your doctor or health care professional if you are unable to keep an appointment. What may interact with this medicine? -aspirin and aspirin-like medicines -medicines to increase blood counts like filgrastim, pegfilgrastim, sargramostim -methotrexate -NSAIDS, medicines for pain and inflammation, like ibuprofen or naproxen -probenecid -pyrimethamine -vaccines Talk to your doctor or health care professional before taking any of these medicines: -acetaminophen -aspirin -ibuprofen -ketoprofen -naproxen This list may not describe all possible interactions. Give your health care provider a list of all the medicines, herbs, non-prescription drugs, or dietary supplements you use. Also tell them if you smoke, drink alcohol, or use illegal drugs. Some items may interact with your medicine. What should I watch for while using this medicine? Visit your doctor for checks on your progress. This drug may make you feel generally unwell. This is not uncommon, as chemotherapy can affect healthy cells as well as cancer cells. Report any side effects. Continue your course of treatment even though you feel ill unless your doctor tells you to stop. In some cases, you  may be given additional medicines to help with side effects. Follow all directions for their use. Call your doctor or health care professional for  advice if you get a fever, chills or sore throat, or other symptoms of a cold or flu. Do not treat yourself. This drug decreases your body's ability to fight infections. Try to avoid being around people who are sick. This medicine may increase your risk to bruise or bleed. Call your doctor or health care professional if you notice any unusual bleeding. Be careful brushing and flossing your teeth or using a toothpick because you may get an infection or bleed more easily. If you have any dental work done, tell your dentist you are receiving this medicine. Avoid taking products that contain aspirin, acetaminophen, ibuprofen, naproxen, or ketoprofen unless instructed by your doctor. These medicines may hide a fever. Call your doctor or health care professional if you get diarrhea or mouth sores. Do not treat yourself. To protect your kidneys, drink water or other fluids as directed while you are taking this medicine. Men and women must use effective birth control while taking this medicine. You may also need to continue using effective birth control for a time after stopping this medicine. Do not become pregnant while taking this medicine. Tell your doctor right away if you think that you or your partner might be pregnant. There is a potential for serious side effects to an unborn child. Talk to your health care professional or pharmacist for more information. Do not breast-feed an infant while taking this medicine. This medicine may lower sperm counts. What side effects may I notice from receiving this medicine? Side effects that you should report to your doctor or health care professional as soon as possible: -allergic reactions like skin rash, itching or hives, swelling of the face, lips, or tongue -low blood counts - this medicine may decrease the number of white blood cells, red blood cells and platelets. You may be at increased risk for infections and bleeding. -signs of infection - fever or chills,  cough, sore throat, pain or difficulty passing urine -signs of decreased platelets or bleeding - bruising, pinpoint red spots on the skin, black, tarry stools, blood in the urine -signs of decreased red blood cells - unusually weak or tired, fainting spells, lightheadedness -breathing problems, like a dry cough -changes in emotions or moods -chest pain -confusion -diarrhea -high blood pressure -mouth or throat sores or ulcers -pain, swelling, warmth in the leg -pain on swallowing -swelling of the ankles, feet, hands -trouble passing urine or change in the amount of urine -vomiting -yellowing of the eyes or skin Side effects that usually do not require medical attention (report to your doctor or health care professional if they continue or are bothersome): -hair loss -loss of appetite -nausea -stomach upset This list may not describe all possible side effects. Call your doctor for medical advice about side effects. You may report side effects to FDA at 1-800-FDA-1088. Where should I keep my medicine? This drug is given in a hospital or clinic and will not be stored at home. NOTE: This sheet is a summary. It may not cover all possible information. If you have questions about this medicine, talk to your doctor, pharmacist, or health care provider.  2015, Elsevier/Gold Standard. (2008-07-03 13:24:03)    Carboplatin injection What is this medicine? CARBOPLATIN (KAR boe pla tin) is a chemotherapy drug. It targets fast dividing cells, like cancer cells, and causes these cells to die. This medicine is  used to treat ovarian cancer and many other cancers. This medicine may be used for other purposes; ask your health care provider or pharmacist if you have questions. COMMON BRAND NAME(S): Paraplatin What should I tell my health care provider before I take this medicine? They need to know if you have any of these conditions: -blood disorders -hearing problems -kidney disease -recent or  ongoing radiation therapy -an unusual or allergic reaction to carboplatin, cisplatin, other chemotherapy, other medicines, foods, dyes, or preservatives -pregnant or trying to get pregnant -breast-feeding How should I use this medicine? This drug is usually given as an infusion into a vein. It is administered in a hospital or clinic by a specially trained health care professional. Talk to your pediatrician regarding the use of this medicine in children. Special care may be needed. Overdosage: If you think you have taken too much of this medicine contact a poison control center or emergency room at once. NOTE: This medicine is only for you. Do not share this medicine with others. What if I miss a dose? It is important not to miss a dose. Call your doctor or health care professional if you are unable to keep an appointment. What may interact with this medicine? -medicines for seizures -medicines to increase blood counts like filgrastim, pegfilgrastim, sargramostim -some antibiotics like amikacin, gentamicin, neomycin, streptomycin, tobramycin -vaccines Talk to your doctor or health care professional before taking any of these medicines: -acetaminophen -aspirin -ibuprofen -ketoprofen -naproxen This list may not describe all possible interactions. Give your health care provider a list of all the medicines, herbs, non-prescription drugs, or dietary supplements you use. Also tell them if you smoke, drink alcohol, or use illegal drugs. Some items may interact with your medicine. What should I watch for while using this medicine? Your condition will be monitored carefully while you are receiving this medicine. You will need important blood work done while you are taking this medicine. This drug may make you feel generally unwell. This is not uncommon, as chemotherapy can affect healthy cells as well as cancer cells. Report any side effects. Continue your course of treatment even though you feel ill  unless your doctor tells you to stop. In some cases, you may be given additional medicines to help with side effects. Follow all directions for their use. Call your doctor or health care professional for advice if you get a fever, chills or sore throat, or other symptoms of a cold or flu. Do not treat yourself. This drug decreases your body's ability to fight infections. Try to avoid being around people who are sick. This medicine may increase your risk to bruise or bleed. Call your doctor or health care professional if you notice any unusual bleeding. Be careful brushing and flossing your teeth or using a toothpick because you may get an infection or bleed more easily. If you have any dental work done, tell your dentist you are receiving this medicine. Avoid taking products that contain aspirin, acetaminophen, ibuprofen, naproxen, or ketoprofen unless instructed by your doctor. These medicines may hide a fever. Do not become pregnant while taking this medicine. Women should inform their doctor if they wish to become pregnant or think they might be pregnant. There is a potential for serious side effects to an unborn child. Talk to your health care professional or pharmacist for more information. Do not breast-feed an infant while taking this medicine. What side effects may I notice from receiving this medicine? Side effects that you should report to your  doctor or health care professional as soon as possible: -allergic reactions like skin rash, itching or hives, swelling of the face, lips, or tongue -signs of infection - fever or chills, cough, sore throat, pain or difficulty passing urine -signs of decreased platelets or bleeding - bruising, pinpoint red spots on the skin, black, tarry stools, nosebleeds -signs of decreased red blood cells - unusually weak or tired, fainting spells, lightheadedness -breathing problems -changes in hearing -changes in vision -chest pain -high blood pressure -low  blood counts - This drug may decrease the number of white blood cells, red blood cells and platelets. You may be at increased risk for infections and bleeding. -nausea and vomiting -pain, swelling, redness or irritation at the injection site -pain, tingling, numbness in the hands or feet -problems with balance, talking, walking -trouble passing urine or change in the amount of urine Side effects that usually do not require medical attention (report to your doctor or health care professional if they continue or are bothersome): -hair loss -loss of appetite -metallic taste in the mouth or changes in taste This list may not describe all possible side effects. Call your doctor for medical advice about side effects. You may report side effects to FDA at 1-800-FDA-1088. Where should I keep my medicine? This drug is given in a hospital or clinic and will not be stored at home. NOTE: This sheet is a summary. It may not cover all possible information. If you have questions about this medicine, talk to your doctor, pharmacist, or health care provider.  2015, Elsevier/Gold Standard. (2008-03-06 14:38:05)

## 2015-02-05 NOTE — [facility] Oncology Navigator Note (Unsigned)
Spoke with patient today at his first chemo tx.  He is doing well without complaints. I asked that he call if there was something I can do for him.  He stated he would.

## 2015-02-06 ENCOUNTER — Telehealth: Payer: Self-pay | Admitting: *Deleted

## 2015-02-06 DIAGNOSIS — C3492 Malignant neoplasm of unspecified part of left bronchus or lung: Secondary | ICD-10-CM | POA: Diagnosis not present

## 2015-02-06 NOTE — Telephone Encounter (Signed)
Spoke with pt today for post chemo follow up.  Stated he was doing fine.  Denied nausea/vomiting; bowel and bladder function fine; good appetite and drinking lots of fluids as tolerated; denied pain.  Pt understood to call office with any new problems.

## 2015-02-07 ENCOUNTER — Ambulatory Visit
Admission: RE | Admit: 2015-02-07 | Discharge: 2015-02-07 | Disposition: A | Discharge status: Home / Self Care | Payer: Medicare Other | Source: Ambulatory Visit | Attending: Radiation Oncology | Admitting: Radiation Oncology

## 2015-02-07 ENCOUNTER — Encounter: Payer: Self-pay | Admitting: Radiation Oncology

## 2015-02-07 VITALS — BP 128/73 | HR 72 | Temp 97.5°F | Resp 16

## 2015-02-07 DIAGNOSIS — C7931 Secondary malignant neoplasm of brain: Secondary | ICD-10-CM

## 2015-02-07 DIAGNOSIS — C3492 Malignant neoplasm of unspecified part of left bronchus or lung: Secondary | ICD-10-CM | POA: Diagnosis not present

## 2015-02-07 NOTE — Progress Notes (Signed)
  Radiation Oncology         [phone] ________________________________  Stereotactic Treatment Procedure Note  Name: [name] MRN: [id number]  Date: 02/07/2015  DOB: [birth year]  SPECIAL TREATMENT PROCEDURE    ICD-9-CM ICD-10-CM   1. 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  It showed 3-dimensional radiation distributions overlaid onto the planning CT/MRI image set.  The [facility] for the target structures as well as the organs at risk were reviewed. The documentation of the 3D plan and dosimetry are filed in the radiation oncology EMR.  NARRATIVE:  Luke Contreras was brought to the TrueBeam stereotactic radiation treatment machine and placed supine on the CT couch. The head frame was applied, and the patient was set up for stereotactic radiosurgery.  Neurosurgery was present for the set-up and delivery  SIMULATION VERIFICATION:  In the couch zero-angle position, the patient underwent Exactrac imaging using the Brainlab system with orthogonal KV images.  These were carefully aligned and repeated to confirm treatment position for each of the isocenters.  The Exactrac snap film verification was repeated at each couch angle.  SPECIAL TREATMENT PROCEDURE: Luke Contreras received stereotactic radiosurgery to the following targets: Right Periventricular Temporal 9 mm target was treated using 2 Dynamic Conformal Arcs to a prescription dose of 20 Gy.  ExacTrac registration was performed for each couch angle.  The 80% isodose line was prescribed.  6 MV X-rays were delivered in the flattening filter free beam mode.  STEREOTACTIC TREATMENT MANAGEMENT:  Following delivery, the patient was transported to nursing in stable condition and monitored for possible acute effects.  Vital signs were recorded BP 128/73 mmHg  Pulse 72  Temp(Src)  97.5 F (36.4 C) (Oral)  Resp 16  SpO2 100%. The patient tolerated treatment without significant acute effects, and was discharged to home in stable condition.    PLAN: Follow-up in one month.  ________________________________  Sheral Apley. Tammi Klippel, M.D.

## 2015-02-07 NOTE — Progress Notes (Addendum)
Nurse monitoring complete. Vitals stable. Patient with complaints or concerns. Denies headache, dizziness, or nausea. Patient discharged home with wife and son. Understands to contact staff with future needs. Confirmed Monday's port and treat chest appt. Patient denies taking decadron at this time.

## 2015-02-07 NOTE — Op Note (Signed)
  Name: Luke Contreras  MRN: [id number]  Date: 02/07/2015   DOB: May 18, 1930  Stereotactic Radiosurgery Operative Note  PRE-OPERATIVE DIAGNOSIS:  Solitary Brain Metastasis  POST-OPERATIVE DIAGNOSIS:  Solitary Brain Metastasis  PROCEDURE:  Stereotactic Radiosurgery  SURGEON:  Faythe Ghee, MD  NARRATIVE: The patient underwent a radiation treatment planning session in the radiation oncology simulation suite under the care of the radiation oncology physician and physicist.  I participated closely in the radiation treatment planning afterwards. The patient underwent planning CT which was fused to 3T high resolution MRI with 1 mm axial slices.  These images were fused on the planning system.  We contoured the gross target volumes and subsequently expanded this to yield the Planning Target Volume. I actively participated in the planning process.  I helped to define and review the target contours and also the contours of the optic pathway, eyes, brainstem and selected nearby organs at risk.  All the dose constraints for critical structures were reviewed and compared to AAPM Task Group 101.  The prescription dose conformity was reviewed.  I approved the plan electronically.    Accordingly, Luke Contreras was brought to the TrueBeam stereotactic radiation treatment linac and placed in the custom immobilization mask.  The patient was aligned according to the IR fiducial markers with BrainLab Exactrac, then orthogonal x-rays were used in ExacTrac with the 6DOF robotic table and the shifts were made to align the patient  Luke Contreras received stereotactic radiosurgery uneventfully.    The detailed description of the procedure is recorded in the radiation oncology procedure note.  I was present for the duration of the procedure.  DISPOSITION:  Following delivery, the patient was transported to nursing in stable condition and monitored for possible acute effects to be discharged to home in stable condition with  follow-up in one month.  Faythe Ghee, MD 02/07/2015 1:17 PM

## 2015-02-11 ENCOUNTER — Telehealth: Payer: Self-pay | Admitting: Medical Oncology

## 2015-02-11 ENCOUNTER — Other Ambulatory Visit: Payer: Medicare Other

## 2015-02-11 ENCOUNTER — Other Ambulatory Visit: Payer: Self-pay | Admitting: Oncology

## 2015-02-11 ENCOUNTER — Ambulatory Visit
Admission: RE | Admit: 2015-02-11 | Discharge: 2015-02-11 | Disposition: A | Discharge status: Home / Self Care | Payer: Medicare Other | Source: Ambulatory Visit | Attending: Radiation Oncology | Admitting: Radiation Oncology

## 2015-02-11 ENCOUNTER — Ambulatory Visit: Payer: Medicare Other | Admitting: Oncology

## 2015-02-11 DIAGNOSIS — C3492 Malignant neoplasm of unspecified part of left bronchus or lung: Secondary | ICD-10-CM | POA: Diagnosis not present

## 2015-02-11 NOTE — Telephone Encounter (Signed)
Luke Contreras  Luke Contreras no showed for lab and a visit with me today. I am going to send a POF to get him set up for a lab and visit this week with someone since he had his 1st chemo last week. Will also set him up for weekly labs and get his chemo appts straightened out. Looks like he was put down for weekly Carbo/Taxol when he is really getting q 3 weeks Carbo/Alimta.  Thanks

## 2015-02-11 NOTE — Telephone Encounter (Signed)
Cancel infusion appt tomorrow -pt not due. Will notify Kristen.

## 2015-02-12 ENCOUNTER — Ambulatory Visit: Payer: Medicare Other

## 2015-02-12 ENCOUNTER — Telehealth: Payer: Self-pay | Admitting: Internal Medicine

## 2015-02-12 ENCOUNTER — Other Ambulatory Visit: Payer: Self-pay | Admitting: Medical Oncology

## 2015-02-12 ENCOUNTER — Ambulatory Visit
Admission: RE | Admit: 2015-02-12 | Discharge: 2015-02-12 | Disposition: A | Discharge status: Home / Self Care | Payer: Medicare Other | Source: Ambulatory Visit | Attending: Radiation Oncology | Admitting: Radiation Oncology

## 2015-02-12 ENCOUNTER — Other Ambulatory Visit: Payer: Self-pay | Admitting: Internal Medicine

## 2015-02-12 ENCOUNTER — Telehealth: Payer: Self-pay | Admitting: *Deleted

## 2015-02-12 DIAGNOSIS — C3492 Malignant neoplasm of unspecified part of left bronchus or lung: Secondary | ICD-10-CM | POA: Diagnosis not present

## 2015-02-12 NOTE — Progress Notes (Signed)
Met w/ pt in lobby to make him aware chemo will be rescheduled per 2/29 POF. Pt voices understanding. Reports he was unaware of lab/Kristin NP appts yesterday.

## 2015-02-12 NOTE — Telephone Encounter (Signed)
Pt confirmed labs/ov per 02/29 POF, gave pt AVS.... KJ,  sent msg to add chemo

## 2015-02-12 NOTE — Telephone Encounter (Signed)
Per staff message and POF I have scheduled appts. Advised scheduler of appts and to move lab/flush JMW  

## 2015-02-13 ENCOUNTER — Ambulatory Visit
Admission: RE | Admit: 2015-02-13 | Discharge: 2015-02-13 | Disposition: A | Discharge status: Home / Self Care | Payer: Medicare Other | Source: Ambulatory Visit | Attending: Radiation Oncology | Admitting: Radiation Oncology

## 2015-02-13 DIAGNOSIS — C3492 Malignant neoplasm of unspecified part of left bronchus or lung: Secondary | ICD-10-CM | POA: Diagnosis not present

## 2015-02-14 ENCOUNTER — Ambulatory Visit
Admission: RE | Admit: 2015-02-14 | Discharge: 2015-02-14 | Disposition: A | Discharge status: Home / Self Care | Payer: Medicare Other | Source: Ambulatory Visit | Attending: Radiation Oncology | Admitting: Radiation Oncology

## 2015-02-14 DIAGNOSIS — C3492 Malignant neoplasm of unspecified part of left bronchus or lung: Secondary | ICD-10-CM | POA: Diagnosis not present

## 2015-02-15 ENCOUNTER — Ambulatory Visit
Admission: RE | Admit: 2015-02-15 | Discharge: 2015-02-15 | Disposition: A | Discharge status: Home / Self Care | Payer: Medicare Other | Source: Ambulatory Visit | Attending: Radiation Oncology | Admitting: Radiation Oncology

## 2015-02-15 ENCOUNTER — Telehealth: Payer: Self-pay | Admitting: Internal Medicine

## 2015-02-15 ENCOUNTER — Encounter: Payer: Self-pay | Admitting: Radiation Oncology

## 2015-02-15 ENCOUNTER — Ambulatory Visit (HOSPITAL_BASED_OUTPATIENT_CLINIC_OR_DEPARTMENT_OTHER): Payer: Medicare Other | Admitting: Physician Assistant

## 2015-02-15 ENCOUNTER — Other Ambulatory Visit (HOSPITAL_BASED_OUTPATIENT_CLINIC_OR_DEPARTMENT_OTHER): Payer: Medicare Other

## 2015-02-15 VITALS — BP 109/63 | HR 91 | Resp 16 | Wt 182.2 lb

## 2015-02-15 VITALS — BP 127/61 | HR 83 | Temp 97.9°F | Resp 16 | Ht 69.0 in | Wt 181.3 lb

## 2015-02-15 DIAGNOSIS — C3412 Malignant neoplasm of upper lobe, left bronchus or lung: Secondary | ICD-10-CM | POA: Diagnosis not present

## 2015-02-15 DIAGNOSIS — C3492 Malignant neoplasm of unspecified part of left bronchus or lung: Secondary | ICD-10-CM

## 2015-02-15 LAB — CBC WITH DIFFERENTIAL/PLATELET
BASO%: 0.1 % (ref 0.0–2.0)
BASOS ABS: 0 10*3/uL (ref 0.0–0.1)
EOS ABS: 0 10*3/uL (ref 0.0–0.5)
EOS%: 1.7 % (ref 0.0–7.0)
HCT: 36.7 % — ABNORMAL LOW (ref 38.4–49.9)
HGB: 11.3 g/dL — ABNORMAL LOW (ref 13.0–17.1)
LYMPH%: 49.7 % — AB (ref 14.0–49.0)
MCH: 26.4 pg — ABNORMAL LOW (ref 27.2–33.4)
MCHC: 30.7 g/dL — AB (ref 32.0–36.0)
MCV: 86 fL (ref 79.3–98.0)
MONO#: 0.1 10*3/uL (ref 0.1–0.9)
MONO%: 6.2 % (ref 0.0–14.0)
NEUT#: 0.9 10*3/uL — ABNORMAL LOW (ref 1.5–6.5)
NEUT%: 42.3 % (ref 39.0–75.0)
Platelets: 90 10*3/uL — ABNORMAL LOW (ref 140–400)
RBC: 4.27 10*6/uL (ref 4.20–5.82)
RDW: 14.6 % (ref 11.0–14.6)
WBC: 2.2 10*3/uL — ABNORMAL LOW (ref 4.0–10.3)
lymph#: 1.1 10*3/uL (ref 0.9–3.3)

## 2015-02-15 LAB — COMPREHENSIVE METABOLIC PANEL (CC13)
ALK PHOS: 118 U/L (ref 40–150)
ALT: 86 U/L — ABNORMAL HIGH (ref 0–55)
AST: 45 U/L — ABNORMAL HIGH (ref 5–34)
Albumin: 2.9 g/dL — ABNORMAL LOW (ref 3.5–5.0)
Anion Gap: 9 mEq/L (ref 3–11)
BUN: 17.6 mg/dL (ref 7.0–26.0)
CALCIUM: 9 mg/dL (ref 8.4–10.4)
CO2: 26 mEq/L (ref 22–29)
Chloride: 105 mEq/L (ref 98–109)
Creatinine: 0.8 mg/dL (ref 0.7–1.3)
EGFR: 83 mL/min/{1.73_m2} — ABNORMAL LOW (ref >90)
GLUCOSE: 100 mg/dL (ref 70–140)
POTASSIUM: 3.9 meq/L (ref 3.5–5.1)
SODIUM: 140 meq/L (ref 136–145)
Total Bilirubin: 0.56 mg/dL (ref 0.20–1.20)
Total Protein: 6.3 g/dL — ABNORMAL LOW (ref 6.4–8.3)

## 2015-02-15 MED ORDER — RADIAPLEXRX EX GEL
Freq: Once | CUTANEOUS | Status: AC
  Administered 2015-02-15: 14:00:00 via TOPICAL

## 2015-02-15 NOTE — Addendum Note (Signed)
Encounter addended by: Heywood Footman, RN on: 02/15/2015  2:27 PM<BR>     Documentation filed: Dx Association, Inpatient MAR, Orders

## 2015-02-15 NOTE — Addendum Note (Signed)
Encounter addended by: Heywood Footman, RN on: 02/15/2015  2:37 PM<BR>     Documentation filed: Inpatient Patient Education, Inpatient Document Flowsheet, Notes Section, Medications, Chief Complaint Section

## 2015-02-15 NOTE — Progress Notes (Signed)
Department of Radiation Oncology  Phone:  [phone] Fax:        [phone]  Weekly Treatment Note    Name: Luke Contreras Date: 02/15/2015 MRN: [id number] DOB: [birth year]   Current dose: 10 Gy  Current fraction:5   MEDICATIONS: Current Outpatient Prescriptions  Medication Sig Dispense Refill  . alum & mag hydroxide-simeth (MAALOX/MYLANTA) 200-200-20 MG/5ML suspension Take by mouth every 6 (six) hours as needed for indigestion or heartburn.    Marland Kitchen aspirin 325 MG EC tablet Take 325 mg by mouth daily.    Marland Kitchen atorvastatin (LIPITOR) 40 MG tablet Take 0.5 tablets (20 mg total) by mouth daily. 45 tablet 0  . clorazepate (TRANXENE) 7.5 MG tablet Take 7.5 mg by mouth at bedtime.      . Cyanocobalamin (VITAMIN B-12 PO) Take 500 mcg by mouth daily.     Marland Kitchen dexamethasone (DECADRON) 4 MG tablet Take 1 tablet by mouth twice daily with food the day before, the day of and the day after chemotherapy 30 tablet 1  . Dextromethorphan-Guaifenesin (CORICIDIN HBP CONGESTION/COUGH PO) Take by mouth daily as needed.    . fluorouracil (EFUDEX) 5 % cream Apply 1 application topically daily.   0  . folic acid (FOLVITE) 1 MG tablet Take 1 tablet (1 mg total) by mouth daily. 30 tablet 3  . lisinopril (PRINIVIL,ZESTRIL) 2.5 MG tablet Take 1 tablet (2.5 mg total) by mouth daily. 90 tablet 0  . Misc Natural Products (OSTEO BI-FLEX JOINT SHIELD PO) Take 1 tablet by mouth daily.      . multivitamin (THERAGRAN) per tablet Take 1 tablet by mouth daily.      . nitroGLYCERIN (NITROSTAT) 0.4 MG SL tablet Place 1 tablet (0.4 mg total) under the tongue every 5 (five) minutes as needed for chest pain. 25 tablet 5  . omeprazole (PRILOSEC) 20 MG capsule Take 20 mg by mouth daily.     . prochlorperazine (COMPAZINE) 10 MG tablet Take 1 tablet (10 mg total) by mouth every 6 (six) hours as needed for nausea or vomiting. 30 tablet 1   No current facility-administered medications for this encounter.     ALLERGIES: Cephalexin  and Aspirin   LABORATORY DATA:  Lab Results  Component Value Date   WBC 2.2* 02/15/2015   HGB 11.3* 02/15/2015   HCT 36.7* 02/15/2015   MCV 86.0 02/15/2015   PLT 90* 02/15/2015   Lab Results  Component Value Date   NA 142 01/24/2015   K 4.1 01/24/2015   CL 105 01/11/2015   CO2 27 01/24/2015   Lab Results  Component Value Date   ALT 12 01/24/2015   AST 24 01/24/2015   ALKPHOS 127 01/24/2015   BILITOT 0.77 01/24/2015     NARRATIVE: Luke Contreras was seen today for weekly treatment management. The chart was checked and the patient's films were reviewed.  Reports occasional productive cough with brown tinted sputum. Hoarseness noted. Denies difficult or painful swallowing. Weight and vitals stable. Denies pain. No skin changes within treatment field noted. Denies headache, dizziness, or vomiting. Reports one episode of nausea following treatment earlier this week.   He states he had some abdominal pain a couple of times this week. This has resolved. Possibly associated with nausea.  PHYSICAL EXAMINATION: weight is 182 lb 3.2 oz (82.645 kg). His blood pressure is 109/63 and his pulse is 91. His respiration is 16.        ASSESSMENT: The patient is doing satisfactorily with treatment.  PLAN: We will  continue with the patient's radiation treatment as planned.

## 2015-02-15 NOTE — Telephone Encounter (Signed)
gv adn printed appt sched and avs fo rpt for March adn April

## 2015-02-15 NOTE — Progress Notes (Signed)
Reports occasional productive cough with brown tinted sputum. Hoarseness noted. Denies difficult or painful swallowing. Weight and vitals stable. Denies pain. No skin changes within treatment field noted. Denies headache, dizziness, or vomiting. Reports one episode of nausea following treatment earlier this week.

## 2015-02-15 NOTE — Progress Notes (Signed)
Oriented patient to staff and routine of the clinic. Provided patient with RADIATION THERAPY AND YOU handbook then, reviewed pertinent information. Provided patient with radiaplex gel and directed upon use. Educated patient reference potential side effects and management such as fatigue, skin changes, cough and throat changes. Patient verbalized understanding of all reviewed. Allowed patient the opportunity to ask questions and answered those to the best of my ability.

## 2015-02-17 NOTE — Patient Instructions (Signed)
Continue weekly labs as scheduled Continue radiation therapy as scheduled Follow up in 2 weeks, prior to the start of your next scheduled cycle of chemotherapy

## 2015-02-17 NOTE — Progress Notes (Signed)
[facility] [phone]   [phone] Multidisciplinary thoracic oncology clinic  OFFICE PROGRESS NOTE  [address], [address], [place] [id number]  DIAGNOSIS: Locally advanced non-small cell lung cancer, poorly differentiated adenocarcinoma diagnosed in January 2016. Staging workup is still pending.  PRIOR THERAPY: Status post SRS treatment to single brain lesion under the care of Dr. Tammi Klippel.  CURRENT THERAPY:  1. Systemic chemotherapy with carboplatin for an AUC of 5 and Alimta at 500 mg/m given every 3 weeks. Status post 1 cycle. 2. Palliative radiation therapy under the care of the [facility]. Last fraction of radiation therapy expected 03/27/2015.   INTERVAL HISTORY: Luke Contreras 81 y.o. male returns to the clinic today for follow-up visit accompanied by his son, Kasandra Knudsen. The patient is feeling fine today with no specific complaints except for the baseline shortness of breath in addition to hoarseness of his voice. He tolerated his first cycle of chemotherapy relatively well. He did have some constipation but is managing this through eating range areas and oatmeal. He reports some nausea after radiation therapy but otherwise had no significant adverse effects. He denied having any significant weight loss or night sweats. He has no chest pain but continues to have shortness breath with exertion with mild cough with no hemoptysis. The patient denied having any significant nausea or vomiting.   MEDICAL HISTORY: Past Medical History  Diagnosis Date  . History of acute anterior wall myocardial infarction 11/2006  . Atrial fibrillation   . Dyslipidemia   . Hypertension   . Coronary artery disease     history of  obstructive single-valve disease  . PAC (premature atrial contraction)   . Left ventricular dysfunction     withEF of 40-45%   . Cancer     skin cancer- side of nausea  . Complication of anesthesia   . Shortness  of breath dyspnea     " when talking and completing a long sentence  . GERD (gastroesophageal reflux disease)   . Arthritis     hands, knees    ALLERGIES:  is allergic to cephalexin and aspirin.  MEDICATIONS:  Current Outpatient Prescriptions  Medication Sig Dispense Refill  . alum & mag hydroxide-simeth (MAALOX/MYLANTA) 200-200-20 MG/5ML suspension Take by mouth every 6 (six) hours as needed for indigestion or heartburn.    Marland Kitchen aspirin 325 MG EC tablet Take 325 mg by mouth daily.    Marland Kitchen atorvastatin (LIPITOR) 40 MG tablet Take 0.5 tablets (20 mg total) by mouth daily. 45 tablet 0  . clorazepate (TRANXENE) 7.5 MG tablet Take 7.5 mg by mouth at bedtime.      . Cyanocobalamin (VITAMIN B-12 PO) Take 500 mcg by mouth daily.     Marland Kitchen dexamethasone (DECADRON) 4 MG tablet Take 1 tablet by mouth twice daily with food the day before, the day of and the day after chemotherapy 30 tablet 1  . Dextromethorphan-Guaifenesin (CORICIDIN HBP CONGESTION/COUGH PO) Take by mouth daily as needed.    . fluorouracil (EFUDEX) 5 % cream Apply 1 application topically daily.   0  . folic acid (FOLVITE) 1 MG tablet Take 1 tablet (1 mg total) by mouth daily. 30 tablet 3  . lisinopril (PRINIVIL,ZESTRIL) 2.5 MG tablet Take 1 tablet (2.5 mg total) by mouth daily. 90 tablet 0  . Misc Natural Products (OSTEO BI-FLEX JOINT SHIELD PO) Take 1 tablet by mouth daily.      . multivitamin (THERAGRAN) per tablet Take 1  tablet by mouth daily.      . nitroGLYCERIN (NITROSTAT) 0.4 MG SL tablet Place 1 tablet (0.4 mg total) under the tongue every 5 (five) minutes as needed for chest pain. 25 tablet 5  . omeprazole (PRILOSEC) 20 MG capsule Take 20 mg by mouth daily.     . prochlorperazine (COMPAZINE) 10 MG tablet Take 1 tablet (10 mg total) by mouth every 6 (six) hours as needed for nausea or vomiting. 30 tablet 1  . Wound Cleansers (RADIAPLEX EX) Apply topically.     No current facility-administered medications for this visit.     SURGICAL HISTORY:  Past Surgical History  Procedure Laterality Date  . Cardiac catheterization  11/26/2006    Est. EF of 40% --  A 100% obstructive proximal/mid left anterior descending artery consistent with recent anterior myocardial infarction --  Moderate mid right coronary artery disease --Mild left ventricular dysfunction with EF of 40%  with distal anterior akinesis and periapical dyskinesis    -- Kaylyn Lim., M.D.  . Colonoscopy    . Video bronchoscopy with endobronchial ultrasound N/A 01/11/2015    Procedure: VIDEO BRONCHOSCOPY WITH ENDOBRONCHIAL ULTRASOUND;  Surgeon: Grace Isaac, MD;  Location: [facility];  Service: Thoracic;  Laterality: N/A;  . Lung biopsy N/A 01/11/2015    Procedure: LUNG BIOPSY;  Surgeon: Grace Isaac, MD;  Location: [facility];  Service: Thoracic;  Laterality: N/A;    REVIEW OF SYSTEMS:  Constitutional: negative Eyes: negative Ears, nose, mouth, throat, and face: positive for hoarseness Respiratory: positive for cough and dyspnea on exertion Cardiovascular: negative Gastrointestinal: positive for constipation and nausea Genitourinary:negative Integument/breast: negative Hematologic/lymphatic: negative Musculoskeletal:negative Neurological: negative Behavioral/Psych: negative Endocrine: negative Allergic/Immunologic: negative   PHYSICAL EXAMINATION: General appearance: alert, cooperative, fatigued and no distress Head: Normocephalic, without obvious abnormality, atraumatic Neck: no adenopathy, no JVD, supple, symmetrical, trachea midline and thyroid not enlarged, symmetric, no tenderness/mass/nodules Lymph nodes: Cervical, supraclavicular, and axillary nodes normal. Resp: clear to auscultation bilaterally Back: symmetric, no curvature. ROM normal. No CVA tenderness. Cardio: regular rate and rhythm, S1, S2 normal, no murmur, click, rub or gallop GI: soft, non-tender; bowel sounds normal; no masses,  no organomegaly Extremities: extremities  normal, atraumatic, no cyanosis or edema Neurologic: Alert and oriented X 3, normal strength and tone. Normal symmetric reflexes. Normal coordination and gait  ECOG PERFORMANCE STATUS: 1 - Symptomatic but completely ambulatory  Blood pressure 127/61, pulse 83, temperature 97.9 F (36.6 C), temperature source Oral, resp. rate 16, height 5\' 9"  (1.753 m), weight 181 lb 4.8 oz (82.237 kg), SpO2 99 %.  LABORATORY DATA: Lab Results  Component Value Date   WBC 2.2* 02/15/2015   HGB 11.3* 02/15/2015   HCT 36.7* 02/15/2015   MCV 86.0 02/15/2015   PLT 90* 02/15/2015      Chemistry      Component Value Date/Time   NA 140 02/15/2015 1240   NA 142 01/11/2015 0726   K 3.9 02/15/2015 1240   K 3.6 01/11/2015 0726   CL 105 01/11/2015 0726   CO2 26 02/15/2015 1240   CO2 32 01/11/2015 0726   BUN 17.6 02/15/2015 1240   BUN 10 01/11/2015 0726   CREATININE 0.8 02/15/2015 1240   CREATININE 0.93 01/11/2015 0726   CREATININE 0.88 12/29/2014 1207      Component Value Date/Time   CALCIUM 9.0 02/15/2015 1240   CALCIUM 8.9 01/11/2015 0726   ALKPHOS 118 02/15/2015 1240   ALKPHOS 106 01/11/2015 0726   AST 45* 02/15/2015 1240  AST 26 01/11/2015 0726   ALT 86* 02/15/2015 1240   ALT 14 01/11/2015 0726   BILITOT 0.56 02/15/2015 1240   BILITOT 1.1 01/11/2015 0726       RADIOGRAPHIC STUDIES: Mr [name] HD Contrast  02/13/2015   CLINICAL DATA:  Metastatic lung cancer. Stereotactic radiosurgery targeting.  EXAM: MRI HEAD WITHOUT AND WITH CONTRAST  TECHNIQUE: Multiplanar, multiecho pulse sequences of the brain and surrounding structures were obtained without and with intravenous contrast.  CONTRAST:  41mL MULTIHANCE GADOBENATE DIMEGLUMINE 529 MG/ML IV SOLN  COMPARISON:  MRI head 01/21/2015  FINDINGS: SRS protocol at 3 Tesla.  Right posterior temporal periventricular white matter lesion measures 6 x 9 mm consistent with metastatic disease. This is a ring-enhancing lesion with mild surrounding edema. This  is consistent with a solitary metastatic deposit. No other enhancing lesions are identified. This lesion also shows restricted diffusion related to central necrosis.  Generalized atrophy. Negative for hydrocephalus. Negative for acute infarct.  Minimal chronic ischemic changes in the white matter. No cortical infarct. Brainstem and cerebellum normal  Negative for intracranial hemorrhage  Mild mucosal edema in the paranasal sinuses.  IMPRESSION: Solitary ring-enhancing necrotic metastatic deposit in the right posterior temporal periventricular white matter measuring 6 x 9 mm.   Electronically Signed   By: Franchot Gallo M.D.   On: 02-13-15 14:23   Mr [name] QQ Contrast  01/21/2015   CLINICAL DATA:  81 year old hypertensive male with history of lung cancer. Staging exam. Initial encounter.  EXAM: MRI HEAD WITHOUT AND WITH CONTRAST  TECHNIQUE: Multiplanar, multiecho pulse sequences of the brain and surrounding structures were obtained without and with intravenous contrast.  CONTRAST:  12mL MULTIHANCE GADOBENATE DIMEGLUMINE 529 MG/ML IV SOLN  COMPARISON:  None.  FINDINGS: No acute infarct.  No intracranial hemorrhage.  8 mm ring-like enhancing lesion bordering the posterior aspect of the right temporal horn of the right lateral ventricle. This is suspicious for solitary intracranial metastatic lesion given the patient's history. Infection, inflammation, demyelinating process or primary brain tumor felt to be less likely considerations.  Cervical spondylotic changes with spinal stenosis and Mild cord flattening C3-4 level.  Global age related atrophy without hydrocephalus.  Major intracranial vascular structures are patent.  Minimal to mild paranasal sinus mucosal thickening.  IMPRESSION: 8 mm ring-like enhancing lesion bordering the posterior aspect of the right temporal horn of the right lateral ventricle. This is suspicious for solitary intracranial metastatic lesion given the patient's history.  Cervical  spondylotic changes with spinal stenosis and mild cord flattening C3-4 level.  Please see above.   Electronically Signed   By: Chauncey Cruel M.D.   On: 01/21/2015 20:57   CLINICAL DATA: Initial treatment strategy for left upper lung mass.  EXAM: NUCLEAR MEDICINE PET SKULL BASE TO THIGH  TECHNIQUE: 11.9 mCi F-18 FDG was injected intravenously. Full-ring PET imaging was performed from the skull base to thigh after the radiotracer. CT data was obtained and used for attenuation correction and anatomic localization.  FASTING BLOOD GLUCOSE: Value: 93 mg/dl  COMPARISON: 01/02/2015  FINDINGS: NECK  Mildly asymmetric left tonsillar activity, maximum standard uptake value 4.7 as compared to the right side measuring 3.8. Physiologic activity in supraclavicular brown fat noted.  Chronic right maxillary sinusitis.  CHEST  Centrally necrotic left upper lobe mass with extensive postobstructive atelectasis and pneumonitis, maximum standard uptake value 19.0. This extends into the left hilum and occludes the left upper lobe airway faintly increased activity in the right hilum, maximum standard uptake value  3.4, background mediastinal activity is about 3.0. Accordingly this right hilar activity is borderline increased. The faintly increased right infrahilar nodal activity, 4.1 in maximum standard uptake value, suspicious for malignancy.  Hypermetabolic 7 mm nodule posteriorly in the right upper lobe, maximum standard uptake value 3.6. Symmetric adrenal activity.  Emphysema noted. There is evidence of old granulomatous disease in the chest.  ABDOMEN/PELVIS  Hypermetabolic lesion in the dome of segment 4 a of the liver, maximum standard uptake value 4.8. Similar hypermetabolic lesion associated with a splenic hypodensity, maximum standard uptake value 5.4 a second splenic lesion inferiorly has a maximum standard uptake value of 3.8, compatible with small focal metastatic  lesion.  Incidental infrarenal abdominal aortic aneurysm, 3.6 by 3.5 cm. Umbilical hernia contains adipose tissue.  SKELETON  Destructive right first rib lesion, maximum standard uptake value 16.7. Mild hypermetabolic activity associated with a fracture of the right posterolateral eleventh rib, maximum SUV 3.1. Probably benign.  IMPRESSION: 1. Large centrally necrotic hypermetabolic left upper lobe and hilar mass, with metastatic disease to the contralateral upper lobe, right infrahilar and possibly right hilar nodes, right first rib, liver, and spleen. 2. Mildly asymmetric left tonsillar activity, probably physiologic rather than due to malignancy. 3. Chronic right maxillary sinusitis. 4. Emphysema. 5. 3.6 cm infrarenal abdominal aortic aneurysm. Recommend followup by [place] in 2 years. This recommendation follows ACR consensus guidelines: White Paper of the ACR Incidental Findings Committee II on Vascular Findings. J Am Coll Radiol 2013; 35:361-443. 6. Umbilical hernia contains adipose tissue.  Electronically Signed By: Sherryl Barters M.D. On: 01/18/2015 16:11  ASSESSMENT AND PLAN: This is a very pleasant 81 years old white male recently diagnosed with stage IV non-small cell lung cancer, poorly differentiated adenocarcinoma. He has a single 30mm ringing like enhancing lesion bordering the posterior aspect of the right temporal horn of the right lateral ventricle. He is status post SRS treatment to the single brain lesion. He is currently undergoing palliative radiotherapy to the left upper lobe and hilar mass. Last fraction of radiation therapy expected 03/27/2015. He is status post 1 cycle of systemic chemotherapy with carboplatin and Alimta and overall tolerated this chemotherapy relatively well. He did have some mouth constipation but his increase the fiber in his diet with whole grain Cheerios and oatmeal with good results. He will continue with weekly labs as scheduled. He'll  follow-up in 2 weeks prior to the start of cycle #2.   He was advised to call immediately if he has any concerning symptoms in the interval. The patient voices understanding of current disease status and treatment options and is in agreement with the current care plan.  All questions were answered. The patient knows to call the clinic with any problems, questions or concerns. We can certainly see the patient much sooner if necessary.  Wynetta Emery, [place] E, PA-C

## 2015-02-18 ENCOUNTER — Ambulatory Visit
Admission: RE | Admit: 2015-02-18 | Discharge: 2015-02-18 | Disposition: A | Discharge status: Home / Self Care | Payer: Medicare Other | Source: Ambulatory Visit | Attending: Radiation Oncology | Admitting: Radiation Oncology

## 2015-02-18 DIAGNOSIS — C3492 Malignant neoplasm of unspecified part of left bronchus or lung: Secondary | ICD-10-CM | POA: Diagnosis not present

## 2015-02-19 ENCOUNTER — Telehealth: Payer: Self-pay | Admitting: Medical Oncology

## 2015-02-19 ENCOUNTER — Ambulatory Visit
Admission: RE | Admit: 2015-02-19 | Discharge: 2015-02-19 | Disposition: A | Discharge status: Home / Self Care | Payer: Medicare Other | Source: Ambulatory Visit | Attending: Radiation Oncology | Admitting: Radiation Oncology

## 2015-02-19 ENCOUNTER — Ambulatory Visit (HOSPITAL_BASED_OUTPATIENT_CLINIC_OR_DEPARTMENT_OTHER): Payer: Medicare Other

## 2015-02-19 ENCOUNTER — Other Ambulatory Visit: Payer: Self-pay | Admitting: Internal Medicine

## 2015-02-19 ENCOUNTER — Other Ambulatory Visit (HOSPITAL_BASED_OUTPATIENT_CLINIC_OR_DEPARTMENT_OTHER): Payer: Medicare Other

## 2015-02-19 ENCOUNTER — Telehealth: Payer: Self-pay | Admitting: Internal Medicine

## 2015-02-19 ENCOUNTER — Other Ambulatory Visit: Payer: Self-pay | Admitting: Medical Oncology

## 2015-02-19 DIAGNOSIS — C3492 Malignant neoplasm of unspecified part of left bronchus or lung: Secondary | ICD-10-CM

## 2015-02-19 DIAGNOSIS — C3412 Malignant neoplasm of upper lobe, left bronchus or lung: Secondary | ICD-10-CM | POA: Diagnosis not present

## 2015-02-19 DIAGNOSIS — Z5189 Encounter for other specified aftercare: Secondary | ICD-10-CM

## 2015-02-19 DIAGNOSIS — T451X5A Adverse effect of antineoplastic and immunosuppressive drugs, initial encounter: Principal | ICD-10-CM

## 2015-02-19 DIAGNOSIS — D701 Agranulocytosis secondary to cancer chemotherapy: Secondary | ICD-10-CM | POA: Insufficient documentation

## 2015-02-19 LAB — CBC WITH DIFFERENTIAL/PLATELET
BASO%: 0 % (ref 0.0–2.0)
Basophils Absolute: 0 10*3/uL (ref 0.0–0.1)
EOS%: 0 % (ref 0.0–7.0)
Eosinophils Absolute: 0 10*3/uL (ref 0.0–0.5)
HCT: 34.6 % — ABNORMAL LOW (ref 38.4–49.9)
HGB: 11.3 g/dL — ABNORMAL LOW (ref 13.0–17.1)
LYMPH%: 54.5 % — ABNORMAL HIGH (ref 14.0–49.0)
MCH: 27.7 pg (ref 27.2–33.4)
MCHC: 32.7 g/dL (ref 32.0–36.0)
MCV: 84.8 fL (ref 79.3–98.0)
MONO#: 0.3 10*3/uL (ref 0.1–0.9)
MONO%: 21.2 % — ABNORMAL HIGH (ref 0.0–14.0)
NEUT%: 24.3 % — AB (ref 39.0–75.0)
NEUTROS ABS: 0.3 10*3/uL — AB (ref 1.5–6.5)
NRBC: 0 % (ref 0–0)
Platelets: 113 10*3/uL — ABNORMAL LOW (ref 140–400)
RBC: 4.08 10*6/uL — AB (ref 4.20–5.82)
RDW: 14.1 % (ref 11.0–14.6)
WBC: 1.3 10*3/uL — AB (ref 4.0–10.3)
lymph#: 0.7 10*3/uL — ABNORMAL LOW (ref 0.9–3.3)

## 2015-02-19 LAB — COMPREHENSIVE METABOLIC PANEL (CC13)
ALT: 38 U/L (ref 0–55)
ANION GAP: 9 meq/L (ref 3–11)
AST: 25 U/L (ref 5–34)
Albumin: 2.7 g/dL — ABNORMAL LOW (ref 3.5–5.0)
Alkaline Phosphatase: 110 U/L (ref 40–150)
BILIRUBIN TOTAL: 0.58 mg/dL (ref 0.20–1.20)
BUN: 12.1 mg/dL (ref 7.0–26.0)
CHLORIDE: 105 meq/L (ref 98–109)
CO2: 24 meq/L (ref 22–29)
Calcium: 8.7 mg/dL (ref 8.4–10.4)
Creatinine: 0.8 mg/dL (ref 0.7–1.3)
EGFR: 83 mL/min/{1.73_m2} — AB (ref >90)
Glucose: 118 mg/dl (ref 70–140)
Potassium: 3.9 mEq/L (ref 3.5–5.1)
Sodium: 138 mEq/L (ref 136–145)
Total Protein: 6 g/dL — ABNORMAL LOW (ref 6.4–8.3)

## 2015-02-19 MED ORDER — TBO-FILGRASTIM 300 MCG/0.5ML ~~LOC~~ SOSY
300.0000 ug | PREFILLED_SYRINGE | Freq: Once | SUBCUTANEOUS | Status: AC
  Administered 2015-02-19: 300 ug via SUBCUTANEOUS
  Filled 2015-02-19: qty 0.5

## 2015-02-19 MED ORDER — RADIAPLEXRX EX GEL
Freq: Once | CUTANEOUS | Status: AC
  Administered 2015-02-19: 11:00:00 via TOPICAL

## 2015-02-19 NOTE — Telephone Encounter (Signed)
Left message to confirm appointment for Inj for 03/09

## 2015-02-19 NOTE — Telephone Encounter (Signed)
Reviewed neutropenic precautions with pt and side effects of granix . I instructed pt to call for temp >100.5 , chills.

## 2015-02-20 ENCOUNTER — Telehealth: Payer: Self-pay | Admitting: *Deleted

## 2015-02-20 ENCOUNTER — Ambulatory Visit (HOSPITAL_BASED_OUTPATIENT_CLINIC_OR_DEPARTMENT_OTHER): Payer: Medicare Other

## 2015-02-20 ENCOUNTER — Ambulatory Visit
Admission: RE | Admit: 2015-02-20 | Discharge: 2015-02-20 | Disposition: A | Discharge status: Home / Self Care | Payer: Medicare Other | Source: Ambulatory Visit | Attending: Radiation Oncology | Admitting: Radiation Oncology

## 2015-02-20 DIAGNOSIS — T451X5A Adverse effect of antineoplastic and immunosuppressive drugs, initial encounter: Principal | ICD-10-CM

## 2015-02-20 DIAGNOSIS — Z5189 Encounter for other specified aftercare: Secondary | ICD-10-CM

## 2015-02-20 DIAGNOSIS — C3412 Malignant neoplasm of upper lobe, left bronchus or lung: Secondary | ICD-10-CM | POA: Diagnosis not present

## 2015-02-20 DIAGNOSIS — D701 Agranulocytosis secondary to cancer chemotherapy: Secondary | ICD-10-CM

## 2015-02-20 DIAGNOSIS — C3492 Malignant neoplasm of unspecified part of left bronchus or lung: Secondary | ICD-10-CM | POA: Diagnosis not present

## 2015-02-20 MED ORDER — TBO-FILGRASTIM 300 MCG/0.5ML ~~LOC~~ SOSY
300.0000 ug | PREFILLED_SYRINGE | Freq: Once | SUBCUTANEOUS | Status: AC
  Administered 2015-02-20: 300 ug via SUBCUTANEOUS
  Filled 2015-02-20: qty 0.5

## 2015-02-20 NOTE — Telephone Encounter (Signed)
Curt Bears, MD at 02/20/2015 3:16 PM     Status: Signed       Expand All Collapse All   Stanton Kidney, Please call him and see if he needs to be started on pain medication.             Belva Chimes, LPN at 05/15/8314 17:61 AM     Status: Signed       Expand All Collapse All   Pt came in for Granix injection today. Pt states his lumbar pain since having this treatment is unbearable. He complains that pain is 10/10 at night and he is unable to sleep.  Pt is requesting advise on what he may be able to take or if he might be able to get a prescription for some stronger pain medication. He has been trying tylenol. He does not currently have any prescription pain medication.         Called pt regarding pain, pt states ever since I left the hospital today I havent been in any pain. Pt had concerns regarding his white count, discussed with pt his labs will be rechecked on 3/15. Pt asked me to call his son Kasandra Knudsen and relay information.

## 2015-02-20 NOTE — Telephone Encounter (Signed)
Call placed to pt's son Kasandra Knudsen, discussed neutropenic precautions for pt, pt denied having any pain a this time stated he did not need a prescription. Reviewed upcoming lab/NP appt on 3/15. Kasandra Knudsen advised he has a high tolerance for pain and does not like to take pain medicine due to it messing with his stomach. Request pt or Danny to call office with any concerns. Kasandra Knudsen thanked me for the call.no further concerns.

## 2015-02-20 NOTE — Telephone Encounter (Signed)
Mary, Please call him and see if he needs to be started on pain medication.

## 2015-02-20 NOTE — Telephone Encounter (Signed)
Pt came in for Granix injection today. Pt states his lumbar pain since having this treatment is unbearable. He complains that pain is 10/10 at night and he is unable to sleep.  Pt is requesting advise on what he may be able to take or if he might be able to get a prescription for some stronger pain medication. He has been trying tylenol. He does not currently have any prescription pain medication.

## 2015-02-21 ENCOUNTER — Other Ambulatory Visit: Payer: Self-pay | Admitting: Radiation Therapy

## 2015-02-21 ENCOUNTER — Encounter: Payer: Self-pay | Admitting: Radiation Oncology

## 2015-02-21 ENCOUNTER — Ambulatory Visit
Admission: RE | Admit: 2015-02-21 | Discharge: 2015-02-21 | Disposition: A | Discharge status: Home / Self Care | Payer: Medicare Other | Source: Ambulatory Visit | Attending: Radiation Oncology | Admitting: Radiation Oncology

## 2015-02-21 VITALS — BP 137/69 | HR 100 | Resp 16 | Wt 184.5 lb

## 2015-02-21 DIAGNOSIS — Z87891 Personal history of nicotine dependence: Secondary | ICD-10-CM | POA: Insufficient documentation

## 2015-02-21 DIAGNOSIS — C7931 Secondary malignant neoplasm of brain: Secondary | ICD-10-CM

## 2015-02-21 DIAGNOSIS — C3492 Malignant neoplasm of unspecified part of left bronchus or lung: Secondary | ICD-10-CM

## 2015-02-21 MED ORDER — BIAFINE EX EMUL
Freq: Every day | CUTANEOUS | Status: DC
  Administered 2015-02-21: 11:00:00 via TOPICAL

## 2015-02-21 NOTE — Progress Notes (Signed)
  Radiation Oncology         [phone] ________________________________  Name: Luke Contreras  MRN: [id number]  Date: 02/21/2015  DOB: 1930-11-23  Weekly Radiation Therapy Management    ICD-9-CM ICD-10-CM   1. Non-small cell cancer of left lung 162.9 C34.92 topical emolient (BIAFINE) emulsion    Current Dose: 18 Gy     Planned Dose:  66 Gy  Narrative . . . . . . . . The patient presents for routine under treatment assessment.                                  Dry scaly skin of chest and back noted. Provided patient with Biafine cream and encouraged he use this instead of the Radiaplex bid. Patient verbalized understanding. Denies SOB. Reports an occasional productive cough with scant blood tinged sputum. Denies pain associated with swallowing. Fatigue noted. Weight and vitals stable                                 Set-up films were reviewed.                                 The chart was checked. Physical Findings. . .  weight is 184 lb 8 oz (83.689 kg). His blood pressure is 137/69 and his pulse is 100. His respiration is 16 and oxygen saturation is 98%. . Weight essentially stable.  No significant changes. Impression . . . . . . . The patient is tolerating radiation. Plan . . . . . . . . . . . . Continue treatment as planned.  ________________________________  Sheral Apley. Tammi Klippel, M.D.

## 2015-02-21 NOTE — Progress Notes (Signed)
Dry scaly skin of chest and back noted. Provided patient with Biafine cream and encouraged he use this instead of the Radiaplex bid. Patient verbalized understanding. Denies SOB. Reports an occasional productive cough with scant blood tinged sputum. Denies pain associated with swallowing. Fatigue noted. Weight and vitals stable.

## 2015-02-22 ENCOUNTER — Ambulatory Visit
Admission: RE | Admit: 2015-02-22 | Discharge: 2015-02-22 | Disposition: A | Discharge status: Home / Self Care | Payer: Medicare Other | Source: Ambulatory Visit | Attending: Radiation Oncology | Admitting: Radiation Oncology

## 2015-02-22 DIAGNOSIS — C3492 Malignant neoplasm of unspecified part of left bronchus or lung: Secondary | ICD-10-CM | POA: Diagnosis not present

## 2015-02-24 NOTE — Progress Notes (Signed)
  Radiation Oncology         [phone]) [phone] ________________________________  Name: Luke Contreras MRN: [id number]  Date: 02/07/2015  DOB: 1930-07-12  End of Treatment Note   ICD-9-CM ICD-10-CM    1. 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  ExacTrac registration was performed for each couch angle.  The 80% isodose line was prescribed.  6 MV X-rays were delivered in the flattening filter free beam mode.  Narrative: The patient tolerated radiation treatment relatively well.     Plan: The patient has completed radiation treatment. The patient will return to radiation oncology clinic for routine followup in one month. I advised him to call or return sooner if he has any questions or concerns related to his recovery or treatment. ________________________________  Sheral Apley. Tammi Klippel, M.D.

## 2015-02-25 ENCOUNTER — Ambulatory Visit
Admission: RE | Admit: 2015-02-25 | Discharge: 2015-02-25 | Disposition: A | Discharge status: Home / Self Care | Payer: Medicare Other | Source: Ambulatory Visit | Attending: Radiation Oncology | Admitting: Radiation Oncology

## 2015-02-25 DIAGNOSIS — C3492 Malignant neoplasm of unspecified part of left bronchus or lung: Secondary | ICD-10-CM | POA: Diagnosis not present

## 2015-02-26 ENCOUNTER — Ambulatory Visit: Payer: Medicare Other

## 2015-02-26 ENCOUNTER — Other Ambulatory Visit (HOSPITAL_BASED_OUTPATIENT_CLINIC_OR_DEPARTMENT_OTHER): Payer: Medicare Other

## 2015-02-26 ENCOUNTER — Ambulatory Visit
Admission: RE | Admit: 2015-02-26 | Discharge: 2015-02-26 | Disposition: A | Discharge status: Home / Self Care | Payer: Medicare Other | Source: Ambulatory Visit | Attending: Radiation Oncology | Admitting: Radiation Oncology

## 2015-02-26 ENCOUNTER — Ambulatory Visit (HOSPITAL_BASED_OUTPATIENT_CLINIC_OR_DEPARTMENT_OTHER): Payer: Medicare Other

## 2015-02-26 ENCOUNTER — Ambulatory Visit (HOSPITAL_BASED_OUTPATIENT_CLINIC_OR_DEPARTMENT_OTHER): Payer: Medicare Other | Admitting: Nurse Practitioner

## 2015-02-26 ENCOUNTER — Telehealth: Payer: Self-pay | Admitting: Internal Medicine

## 2015-02-26 VITALS — BP 134/69 | HR 90 | Temp 97.7°F | Resp 18 | Ht 69.0 in | Wt 186.8 lb

## 2015-02-26 DIAGNOSIS — C3492 Malignant neoplasm of unspecified part of left bronchus or lung: Secondary | ICD-10-CM

## 2015-02-26 DIAGNOSIS — C3412 Malignant neoplasm of upper lobe, left bronchus or lung: Secondary | ICD-10-CM

## 2015-02-26 DIAGNOSIS — C7931 Secondary malignant neoplasm of brain: Secondary | ICD-10-CM

## 2015-02-26 DIAGNOSIS — Z5111 Encounter for antineoplastic chemotherapy: Secondary | ICD-10-CM | POA: Diagnosis not present

## 2015-02-26 DIAGNOSIS — R21 Rash and other nonspecific skin eruption: Secondary | ICD-10-CM | POA: Diagnosis not present

## 2015-02-26 LAB — COMPREHENSIVE METABOLIC PANEL (CC13)
ALBUMIN: 2.9 g/dL — AB (ref 3.5–5.0)
ALK PHOS: 115 U/L (ref 40–150)
ALT: 23 U/L (ref 0–55)
AST: 21 U/L (ref 5–34)
Anion Gap: 11 mEq/L (ref 3–11)
BUN: 16.4 mg/dL (ref 7.0–26.0)
CHLORIDE: 105 meq/L (ref 98–109)
CO2: 23 mEq/L (ref 22–29)
Calcium: 8.6 mg/dL (ref 8.4–10.4)
Creatinine: 0.9 mg/dL (ref 0.7–1.3)
EGFR: 81 mL/min/{1.73_m2} — ABNORMAL LOW (ref >90)
Glucose: 233 mg/dl — ABNORMAL HIGH (ref 70–140)
POTASSIUM: 3.9 meq/L (ref 3.5–5.1)
Sodium: 139 mEq/L (ref 136–145)
Total Bilirubin: 0.34 mg/dL (ref 0.20–1.20)
Total Protein: 6.3 g/dL — ABNORMAL LOW (ref 6.4–8.3)

## 2015-02-26 LAB — CBC WITH DIFFERENTIAL/PLATELET
BASO%: 0.1 % (ref 0.0–2.0)
Basophils Absolute: 0 10*3/uL (ref 0.0–0.1)
EOS ABS: 0 10*3/uL (ref 0.0–0.5)
EOS%: 0 % (ref 0.0–7.0)
HEMATOCRIT: 33.8 % — AB (ref 38.4–49.9)
HGB: 11.1 g/dL — ABNORMAL LOW (ref 13.0–17.1)
LYMPH%: 4.6 % — AB (ref 14.0–49.0)
MCH: 28 pg (ref 27.2–33.4)
MCHC: 32.8 g/dL (ref 32.0–36.0)
MCV: 85.4 fL (ref 79.3–98.0)
MONO#: 0.5 10*3/uL (ref 0.1–0.9)
MONO%: 5.1 % (ref 0.0–14.0)
NEUT%: 90.2 % — ABNORMAL HIGH (ref 39.0–75.0)
NEUTROS ABS: 8.1 10*3/uL — AB (ref 1.5–6.5)
PLATELETS: 260 10*3/uL (ref 140–400)
RBC: 3.96 10*6/uL — ABNORMAL LOW (ref 4.20–5.82)
RDW: 14.9 % — ABNORMAL HIGH (ref 11.0–14.6)
WBC: 8.9 10*3/uL (ref 4.0–10.3)
lymph#: 0.4 10*3/uL — ABNORMAL LOW (ref 0.9–3.3)

## 2015-02-26 MED ORDER — BIAFINE EX EMUL
Freq: Every day | CUTANEOUS | Status: DC
  Administered 2015-02-26: 16:00:00 via TOPICAL

## 2015-02-26 MED ORDER — SODIUM CHLORIDE 0.9 % IV SOLN
Freq: Once | INTRAVENOUS | Status: AC
  Administered 2015-02-26: 16:00:00 via INTRAVENOUS

## 2015-02-26 MED ORDER — SODIUM CHLORIDE 0.9 % IV SOLN
460.0000 mg | Freq: Once | INTRAVENOUS | Status: AC
  Administered 2015-02-26: 460 mg via INTRAVENOUS
  Filled 2015-02-26: qty 46

## 2015-02-26 MED ORDER — PEMETREXED DISODIUM CHEMO INJECTION 500 MG
400.0000 mg/m2 | Freq: Once | INTRAVENOUS | Status: AC
  Administered 2015-02-26: 800 mg via INTRAVENOUS
  Filled 2015-02-26: qty 32

## 2015-02-26 MED ORDER — SODIUM CHLORIDE 0.9 % IV SOLN
Freq: Once | INTRAVENOUS | Status: AC
  Administered 2015-02-26: 16:00:00 via INTRAVENOUS
  Filled 2015-02-26: qty 8

## 2015-02-26 NOTE — Progress Notes (Addendum)
[facility] OFFICE PROGRESS NOTE   Diagnosis:  Metastatic non-small cell lung cancer, poorly differentiated adenocarcinoma, diagnosed January 2016 with staging evaluation showing a solitary metastatic brain lesion  PRIOR THERAPY: Status post SRS treatment to single brain lesion under the care of Dr. Tammi Klippel.  CURRENT THERAPY:  1. Systemic chemotherapy with carboplatin for an AUC of 5 and Alimta at 500 mg/m given every 3 weeks. Status post cycle 1 02/05/2015. 2. Palliative radiation therapy under the care of Dr. Tammi Klippel. Last fraction of radiation therapy expected 03/27/2015.   INTERVAL HISTORY:   Luke Contreras returns as scheduled. He completed cycle 1 carboplatin/Alimta on 02/05/2015. No significant nausea. No mouth sores. No diarrhea or constipation. The skin rash over his chest and back is better. The rash predated the start of chemotherapy. He feels it is related to radiation. No leg swelling. He has stable hoarseness. He denies shortness of breath. Stable cough. No hemoptysis. No fever.  Objective:  Vital signs in last 24 hours:  Blood pressure 134/69, pulse 90, temperature 97.7 F (36.5 C), temperature source Oral, resp. rate 18, height 5\' 9"  (1.753 m), weight 186 lb 12.8 oz (84.732 kg), SpO2 96 %.    HEENT: No thrush or ulcers. Lymphatics: No palpable cervical or supraclavicular lymph nodes. Resp: Breath sounds diminished at the left base. Cardio: Regular rate and rhythm. GI: Abdomen soft and nontender. No hepatomegaly. Vascular: No leg edema. Calves soft and nontender. Skin: Erythematous rash over the upper anterior chest and back.    Lab Results:  Lab Results  Component Value Date   WBC 8.9 02/26/2015   HGB 11.1* 02/26/2015   HCT 33.8* 02/26/2015   MCV 85.4 02/26/2015   PLT 260 02/26/2015   NEUTROABS 8.1* 02/26/2015    Imaging:  No results found.  Medications: I have reviewed the patient's current  medications.  Assessment/Plan:  1. Metastatic non-small cell lung cancer involving a left hilar/suprahilar mass and solitary brain metastasis; status post SRS to the brain lesion; currently completing chest radiation; status post cycle 1 carboplatin/Alimta 02/05/2015. 2. Neutropenia following cycle 1 carboplatin/Alimta. 3. Skin rash anterior chest and upper back. Likely radiation dermatitis.  Disposition: Luke Contreras appears stable. He continues radiation. He has completed 1 cycle of carboplatin/Alimta. Plan to proceed with cycle 2 today as scheduled. The chemotherapy will be dose reduced due to neutropenia following cycle 1. If he has neutropenia despite the dose reduction, Neulasta will be added with cycle 3. He will continue weekly labs. We scheduled a follow-up visit in 3 weeks. He will contact the office in the interim with any problems.  Patient seen with Dr. Julien Nordmann.    Ned Card ANP/GNP-BC   02/26/2015  2:54 PM   ADDENDUM: Hematology/Oncology Attending:  I had a face to face encounter with the patient. I recommended his care plan. This is a very pleasant 81 years old white male recently diagnosed with metastatic non-small cell lung cancer, poorly differentiated adenocarcinoma and currently undergoing systemic chemotherapy with carboplatin and Alimta status post 1 cycle. He tolerated the first cycle of his treatment fairly well except for chemotherapy-induced neutropenia and he required treatment with Granix for a few days after his chemotherapy. He is feeling much better today and ready to start the second cycle of his chemotherapy. He has some skin rash on the anterior part of the chest secondary to recent radiation treatment and he is currently applying radiaplex to that area. I recommend for the patient to proceed with the second cycle of  his chemotherapy today as a scheduled. I will reduce the dose of Alimta to 400 MG/M2 because of the neutropenia after the first cycle. The patient  will come back for follow-up visit in 3 weeks for reevaluation before starting cycle #3. He was advised to call immediately if he has any concerning symptoms in the interval.  Disclaimer: This note was dictated with voice recognition software. Similar sounding words can inadvertently be transcribed and may be missed upon review. Eilleen Kempf., MD 02/27/2015

## 2015-02-26 NOTE — Telephone Encounter (Signed)
gv and printed appt sched and avs for pt for March and April...sed added tx. °

## 2015-02-26 NOTE — Patient Instructions (Signed)
Pemetrexed injection What is this medicine? PEMETREXED (PEM e TREX ed) is a chemotherapy drug. This medicine affects cells that are rapidly growing, such as cancer cells and cells in your mouth and stomach. It is usually used to treat lung cancers like non-small cell lung cancer and mesothelioma. It may also be used to treat other cancers. This medicine may be used for other purposes; ask your health care provider or pharmacist if you have questions. COMMON BRAND NAME(S): Alimta What should I tell my health care provider before I take this medicine? They need to know if you have any of these conditions: -if you frequently drink alcohol containing beverages -infection (especially a virus infection such as chickenpox, cold sores, or herpes) -kidney disease -liver disease -low blood counts, like low platelets, red bloods, or white blood cells -an unusual or allergic reaction to pemetrexed, mannitol, other medicines, foods, dyes, or preservatives -pregnant or trying to get pregnant -breast-feeding How should I use this medicine? This drug is given as an infusion into a vein. It is administered in a hospital or clinic by a specially trained health care professional. Talk to your pediatrician regarding the use of this medicine in children. Special care may be needed. Overdosage: If you think you have taken too much of this medicine contact a poison control center or emergency room at once. NOTE: This medicine is only for you. Do not share this medicine with others. What if I miss a dose? It is important not to miss your dose. Call your doctor or health care professional if you are unable to keep an appointment. What may interact with this medicine? -aspirin and aspirin-like medicines -medicines to increase blood counts like filgrastim, pegfilgrastim, sargramostim -methotrexate -NSAIDS, medicines for pain and inflammation, like ibuprofen or naproxen -probenecid -pyrimethamine -vaccines Talk to  your doctor or health care professional before taking any of these medicines: -acetaminophen -aspirin -ibuprofen -ketoprofen -naproxen This list may not describe all possible interactions. Give your health care provider a list of all the medicines, herbs, non-prescription drugs, or dietary supplements you use. Also tell them if you smoke, drink alcohol, or use illegal drugs. Some items may interact with your medicine. What should I watch for while using this medicine? Visit your doctor for checks on your progress. This drug may make you feel generally unwell. This is not uncommon, as chemotherapy can affect healthy cells as well as cancer cells. Report any side effects. Continue your course of treatment even though you feel ill unless your doctor tells you to stop. In some cases, you may be given additional medicines to help with side effects. Follow all directions for their use. Call your doctor or health care professional for advice if you get a fever, chills or sore throat, or other symptoms of a cold or flu. Do not treat yourself. This drug decreases your body's ability to fight infections. Try to avoid being around people who are sick. This medicine may increase your risk to bruise or bleed. Call your doctor or health care professional if you notice any unusual bleeding. Be careful brushing and flossing your teeth or using a toothpick because you may get an infection or bleed more easily. If you have any dental work done, tell your dentist you are receiving this medicine. Avoid taking products that contain aspirin, acetaminophen, ibuprofen, naproxen, or ketoprofen unless instructed by your doctor. These medicines may hide a fever. Call your doctor or health care professional if you get diarrhea or mouth sores. Do not treat  yourself. To protect your kidneys, drink water or other fluids as directed while you are taking this medicine. Men and women must use effective birth control while taking this  medicine. You may also need to continue using effective birth control for a time after stopping this medicine. Do not become pregnant while taking this medicine. Tell your doctor right away if you think that you or your partner might be pregnant. There is a potential for serious side effects to an unborn child. Talk to your health care professional or pharmacist for more information. Do not breast-feed an infant while taking this medicine. This medicine may lower sperm counts. What side effects may I notice from receiving this medicine? Side effects that you should report to your doctor or health care professional as soon as possible: -allergic reactions like skin rash, itching or hives, swelling of the face, lips, or tongue -low blood counts - this medicine may decrease the number of white blood cells, red blood cells and platelets. You may be at increased risk for infections and bleeding. -signs of infection - fever or chills, cough, sore throat, pain or difficulty passing urine -signs of decreased platelets or bleeding - bruising, pinpoint red spots on the skin, black, tarry stools, blood in the urine -signs of decreased red blood cells - unusually weak or tired, fainting spells, lightheadedness -breathing problems, like a dry cough -changes in emotions or moods -chest pain -confusion -diarrhea -high blood pressure -mouth or throat sores or ulcers -pain, swelling, warmth in the leg -pain on swallowing -swelling of the ankles, feet, hands -trouble passing urine or change in the amount of urine -vomiting -yellowing of the eyes or skin Side effects that usually do not require medical attention (report to your doctor or health care professional if they continue or are bothersome): -hair loss -loss of appetite -nausea -stomach upset This list may not describe all possible side effects. Call your doctor for medical advice about side effects. You may report side effects to FDA at  1-800-FDA-1088. Where should I keep my medicine? This drug is given in a hospital or clinic and will not be stored at home. NOTE: This sheet is a summary. It may not cover all possible information. If you have questions about this medicine, talk to your doctor, pharmacist, or health care provider.  2015, Elsevier/Gold Standard. (2008-07-03 13:24:03) Carboplatin injection What is this medicine? CARBOPLATIN (KAR boe pla tin) is a chemotherapy drug. It targets fast dividing cells, like cancer cells, and causes these cells to die. This medicine is used to treat ovarian cancer and many other cancers. This medicine may be used for other purposes; ask your health care provider or pharmacist if you have questions. COMMON BRAND NAME(S): Paraplatin What should I tell my health care provider before I take this medicine? They need to know if you have any of these conditions: -blood disorders -hearing problems -kidney disease -recent or ongoing radiation therapy -an unusual or allergic reaction to carboplatin, cisplatin, other chemotherapy, other medicines, foods, dyes, or preservatives -pregnant or trying to get pregnant -breast-feeding How should I use this medicine? This drug is usually given as an infusion into a vein. It is administered in a hospital or clinic by a specially trained health care professional. Talk to your pediatrician regarding the use of this medicine in children. Special care may be needed. Overdosage: If you think you have taken too much of this medicine contact a poison control center or emergency room at once. NOTE: This medicine is  only for you. Do not share this medicine with others. What if I miss a dose? It is important not to miss a dose. Call your doctor or health care professional if you are unable to keep an appointment. What may interact with this medicine? -medicines for seizures -medicines to increase blood counts like filgrastim, pegfilgrastim,  sargramostim -some antibiotics like amikacin, gentamicin, neomycin, streptomycin, tobramycin -vaccines Talk to your doctor or health care professional before taking any of these medicines: -acetaminophen -aspirin -ibuprofen -ketoprofen -naproxen This list may not describe all possible interactions. Give your health care provider a list of all the medicines, herbs, non-prescription drugs, or dietary supplements you use. Also tell them if you smoke, drink alcohol, or use illegal drugs. Some items may interact with your medicine. What should I watch for while using this medicine? Your condition will be monitored carefully while you are receiving this medicine. You will need important blood work done while you are taking this medicine. This drug may make you feel generally unwell. This is not uncommon, as chemotherapy can affect healthy cells as well as cancer cells. Report any side effects. Continue your course of treatment even though you feel ill unless your doctor tells you to stop. In some cases, you may be given additional medicines to help with side effects. Follow all directions for their use. Call your doctor or health care professional for advice if you get a fever, chills or sore throat, or other symptoms of a cold or flu. Do not treat yourself. This drug decreases your body's ability to fight infections. Try to avoid being around people who are sick. This medicine may increase your risk to bruise or bleed. Call your doctor or health care professional if you notice any unusual bleeding. Be careful brushing and flossing your teeth or using a toothpick because you may get an infection or bleed more easily. If you have any dental work done, tell your dentist you are receiving this medicine. Avoid taking products that contain aspirin, acetaminophen, ibuprofen, naproxen, or ketoprofen unless instructed by your doctor. These medicines may hide a fever. Do not become pregnant while taking this  medicine. Women should inform their doctor if they wish to become pregnant or think they might be pregnant. There is a potential for serious side effects to an unborn child. Talk to your health care professional or pharmacist for more information. Do not breast-feed an infant while taking this medicine. What side effects may I notice from receiving this medicine? Side effects that you should report to your doctor or health care professional as soon as possible: -allergic reactions like skin rash, itching or hives, swelling of the face, lips, or tongue -signs of infection - fever or chills, cough, sore throat, pain or difficulty passing urine -signs of decreased platelets or bleeding - bruising, pinpoint red spots on the skin, black, tarry stools, nosebleeds -signs of decreased red blood cells - unusually weak or tired, fainting spells, lightheadedness -breathing problems -changes in hearing -changes in vision -chest pain -high blood pressure -low blood counts - This drug may decrease the number of white blood cells, red blood cells and platelets. You may be at increased risk for infections and bleeding. -nausea and vomiting -pain, swelling, redness or irritation at the injection site -pain, tingling, numbness in the hands or feet -problems with balance, talking, walking -trouble passing urine or change in the amount of urine Side effects that usually do not require medical attention (report to your doctor or health care professional  if they continue or are bothersome): -hair loss -loss of appetite -metallic taste in the mouth or changes in taste This list may not describe all possible side effects. Call your doctor for medical advice about side effects. You may report side effects to FDA at 1-800-FDA-1088. Where should I keep my medicine? This drug is given in a hospital or clinic and will not be stored at home. NOTE: This sheet is a summary. It may not cover all possible information. If you  have questions about this medicine, talk to your doctor, pharmacist, or health care provider.  2015, Elsevier/Gold Standard. (2008-03-06 14:38:05)

## 2015-02-27 ENCOUNTER — Ambulatory Visit
Admission: RE | Admit: 2015-02-27 | Discharge: 2015-02-27 | Disposition: A | Discharge status: Home / Self Care | Payer: Medicare Other | Source: Ambulatory Visit | Attending: Radiation Oncology | Admitting: Radiation Oncology

## 2015-02-27 DIAGNOSIS — C3492 Malignant neoplasm of unspecified part of left bronchus or lung: Secondary | ICD-10-CM | POA: Diagnosis not present

## 2015-02-28 ENCOUNTER — Encounter: Payer: Self-pay | Admitting: Radiation Oncology

## 2015-02-28 ENCOUNTER — Ambulatory Visit
Admission: RE | Admit: 2015-02-28 | Discharge: 2015-02-28 | Disposition: A | Discharge status: Home / Self Care | Payer: Medicare Other | Source: Ambulatory Visit | Attending: Radiation Oncology | Admitting: Radiation Oncology

## 2015-02-28 VITALS — BP 94/56 | HR 56 | Temp 97.6°F | Resp 20 | Wt 189.6 lb

## 2015-02-28 DIAGNOSIS — C7931 Secondary malignant neoplasm of brain: Secondary | ICD-10-CM

## 2015-02-28 DIAGNOSIS — C3492 Malignant neoplasm of unspecified part of left bronchus or lung: Secondary | ICD-10-CM | POA: Diagnosis not present

## 2015-02-28 NOTE — Progress Notes (Signed)
Patient denies pain, continues to be hoarse, denies SOB, has mainly dry cough, but occasionally gets up "small amount of yellow to clear phlegm". He denies loss of appetite, fatigue, HA, nausea, dizziness, vision changes. He had chemo on Tues. Patient hypotensive today, takes Lisinopril nightly. Informed him that Dr Tammi Klippel may advise he stop Lisinopril; pt will discuss with him today.  BP 94/56 mmHg  Pulse 56  Temp(Src) 97.6 F (36.4 C) (Oral)  Resp 20  Wt 189 lb 9.6 oz (86.002 kg)  SpO2 96%

## 2015-02-28 NOTE — Progress Notes (Signed)
  Radiation Oncology         (336) [phone] ________________________________  Name: Luke Contreras  MRN: [id number]  Date: 02/28/2015  DOB: Jun 12, 1930  Weekly Radiation Therapy Management    ICD-9-CM ICD-10-CM   1. 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 . . . . . . . The patient presents for routine under treatment assessment.                                  Patient denies pain, continues to be hoarse, denies SOB, has mainly dry cough, but occasionally gets up "small amount of yellow to clear phlegm". He denies loss of appetite, fatigue, HA, nausea, dizziness, vision changes. He had chemo on Tues. Patient hypotensive today, takes Lisinopril nightly.                                 Set-up films were reviewed.                                 The chart was checked. Physical Findings. . .  weight is 189 lb 9.6 oz (86.002 kg). His oral temperature is 97.6 F (36.4 C). His blood pressure is 94/56 and his pulse is 56. His respiration is 20 and oxygen saturation is 96%. . Weight essentially stable.  No significant changes. Impression . . . . . . . The patient is tolerating radiation. Plan . . . . . . . . . . . . Continue treatment as planned.  Hold Lisinopril for now.  ________________________________  Sheral Apley. Tammi Klippel, M.D.

## 2015-03-01 ENCOUNTER — Encounter: Payer: Self-pay | Admitting: Radiation Oncology

## 2015-03-01 ENCOUNTER — Ambulatory Visit
Admission: RE | Admit: 2015-03-01 | Discharge: 2015-03-01 | Disposition: A | Discharge status: Home / Self Care | Payer: Medicare Other | Source: Ambulatory Visit | Attending: Radiation Oncology | Admitting: Radiation Oncology

## 2015-03-01 ENCOUNTER — Inpatient Hospital Stay: Admission: RE | Admit: 2015-03-01 | Payer: Self-pay | Source: Ambulatory Visit | Admitting: Radiation Oncology

## 2015-03-01 DIAGNOSIS — C3492 Malignant neoplasm of unspecified part of left bronchus or lung: Secondary | ICD-10-CM | POA: Diagnosis not present

## 2015-03-04 ENCOUNTER — Ambulatory Visit
Admission: RE | Admit: 2015-03-04 | Discharge: 2015-03-04 | Disposition: A | Discharge status: Home / Self Care | Payer: Medicare Other | Source: Ambulatory Visit | Attending: Radiation Oncology | Admitting: Radiation Oncology

## 2015-03-04 ENCOUNTER — Encounter: Payer: Self-pay | Admitting: Radiation Oncology

## 2015-03-04 VITALS — BP 139/77 | HR 98 | Temp 98.1°F | Resp 16 | Wt 189.0 lb

## 2015-03-04 DIAGNOSIS — C3492 Malignant neoplasm of unspecified part of left bronchus or lung: Secondary | ICD-10-CM | POA: Diagnosis not present

## 2015-03-04 MED ORDER — SUCRALFATE 1 G PO TABS: ORAL_TABLET | ORAL | Status: AC

## 2015-03-04 MED ORDER — LIDOCAINE VISCOUS 2 % MT SOLN: OROMUCOSAL | Status: AC

## 2015-03-04 NOTE — Progress Notes (Signed)
Patient requesting to be evaluated by a physician for weakness, skin breakdown and difficulty swallowing. Dry desquamation of entire chest noted despite using biafine bid as directed. Reports discomfort with swallowing. Reports difficulty swallowing thin liquids. Hoarseness worse. Weight and vitals stable.

## 2015-03-04 NOTE — Progress Notes (Signed)
Radiation Oncology         (336) [phone] ________________________________  Name: Luke Contreras  MRN: [id number]  Date: 03/04/2015  DOB: August 06, 1930  Weekly Radiation Therapy Management    ICD-9-CM ICD-10-CM   1. Non-small cell cancer of left lung 162.9 C34.92     Current Dose: 32Gy     Planned Dose:  66 Gy  Narrative . . . . . . . . The patient presents for routine under treatment assessment.Patient requesting to be evaluated by a physician for weakness, skin breakdown and difficulty swallowing. Dry desquamation of entire chest noted despite using biafine bid as directed. Reports discomfort with swallowing. Reports difficulty swallowing thin liquids. Hoarseness worse. Weight and vitals stable.  Weak, but not lightheaded                                                              Set-up films were reviewed.                                 The chart was checked.  Physical Findings. . .  weight is 189 lb (85.73 kg). His oral temperature is 98.1 F (36.7 C). His blood pressure is 139/77 and his pulse is 98. His respiration is 16 and oxygen saturation is 100%. . Erythematous dry desquamation over chest and back. Ambulatory.  CMP     Component Value Date/Time   NA 139 02/26/2015 1118   NA 142 01/11/2015 0726   K 3.9 02/26/2015 1118   K 3.6 01/11/2015 0726   CL 105 01/11/2015 0726   CO2 23 02/26/2015 1118   CO2 32 01/11/2015 0726   GLUCOSE 233* 02/26/2015 1118   GLUCOSE 112* 01/11/2015 0726   BUN 16.4 02/26/2015 1118   BUN 10 01/11/2015 0726   CREATININE 0.9 02/26/2015 1118   CREATININE 0.93 01/11/2015 0726   CREATININE 0.88 12/29/2014 1207   CALCIUM 8.6 02/26/2015 1118   CALCIUM 8.9 01/11/2015 0726   PROT 6.3* 02/26/2015 1118   PROT 6.3 01/11/2015 0726   ALBUMIN 2.9* 02/26/2015 1118   ALBUMIN 2.9* 01/11/2015 0726   AST 21 02/26/2015 1118   AST 26 01/11/2015 0726   ALT 23 02/26/2015 1118   ALT 14 01/11/2015 0726   ALKPHOS 115 02/26/2015 1118   ALKPHOS 106 01/11/2015 0726   BILITOT 0.34 02/26/2015 1118   BILITOT 1.1 01/11/2015 0726   GFRNONAA 76* 01/11/2015 0726   GFRAA 88* 01/11/2015 0726    CBC    Component Value Date/Time   WBC 8.9 02/26/2015 1118   WBC 5.1 01/11/2015 0726   WBC 7.2 12/29/2014 1105   RBC 3.96* 02/26/2015 1118   RBC 4.31 01/11/2015 0726   RBC 4.90 12/29/2014 1105   HGB 11.1* 02/26/2015 1118   HGB 11.9* 01/11/2015 0726   HGB 13.5* 12/29/2014 1105   HCT 33.8* 02/26/2015 1118   HCT 37.0* 01/11/2015 0726   HCT 43.1* 12/29/2014 1105   PLT 260 02/26/2015 1118   PLT 256 01/11/2015 0726   MCV 85.4 02/26/2015 1118   MCV 85.8 01/11/2015 0726   MCV 88.0 12/29/2014 1105   MCH 28.0 02/26/2015 1118   MCH 27.6 01/11/2015 0726   MCH 27.6 12/29/2014 1105   MCHC 32.8  02/26/2015 1118   MCHC 32.2 01/11/2015 0726   MCHC 31.3* 12/29/2014 1105   RDW 14.9* 02/26/2015 1118   RDW 14.3 01/11/2015 0726   LYMPHSABS 0.4* 02/26/2015 1118   MONOABS 0.5 02/26/2015 1118   EOSABS 0.0 02/26/2015 1118   BASOSABS 0.0 02/26/2015 1118      Impression . . . . . . . The patient is tolerating radiation. Plan . . . . . . . . . . . . Continue treatment as planned.  Biafine refilled. Sucralfate and lidocaine MW Rx for esophagitis. Discussed how to take these in detail.  Encouraged pushing PO fluids, nutrition for strength.  -----------------------------------  Eppie Gibson, MD

## 2015-03-05 ENCOUNTER — Other Ambulatory Visit (HOSPITAL_BASED_OUTPATIENT_CLINIC_OR_DEPARTMENT_OTHER): Payer: Medicare Other

## 2015-03-05 ENCOUNTER — Ambulatory Visit
Admission: RE | Admit: 2015-03-05 | Discharge: 2015-03-05 | Disposition: A | Discharge status: Home / Self Care | Payer: Medicare Other | Source: Ambulatory Visit | Attending: Radiation Oncology | Admitting: Radiation Oncology

## 2015-03-05 DIAGNOSIS — C3492 Malignant neoplasm of unspecified part of left bronchus or lung: Secondary | ICD-10-CM

## 2015-03-05 DIAGNOSIS — C7931 Secondary malignant neoplasm of brain: Secondary | ICD-10-CM

## 2015-03-05 DIAGNOSIS — C3412 Malignant neoplasm of upper lobe, left bronchus or lung: Secondary | ICD-10-CM | POA: Diagnosis not present

## 2015-03-05 LAB — CBC WITH DIFFERENTIAL/PLATELET
BASO%: 0 % (ref 0.0–2.0)
Basophils Absolute: 0 10*3/uL (ref 0.0–0.1)
EOS ABS: 0 10*3/uL (ref 0.0–0.5)
EOS%: 0.8 % (ref 0.0–7.0)
HCT: 33.1 % — ABNORMAL LOW (ref 38.4–49.9)
HEMOGLOBIN: 11.1 g/dL — AB (ref 13.0–17.1)
LYMPH#: 0.3 10*3/uL — AB (ref 0.9–3.3)
LYMPH%: 13.4 % — ABNORMAL LOW (ref 14.0–49.0)
MCH: 28.4 pg (ref 27.2–33.4)
MCHC: 33.5 g/dL (ref 32.0–36.0)
MCV: 84.7 fL (ref 79.3–98.0)
MONO#: 0.2 10*3/uL (ref 0.1–0.9)
MONO%: 9.2 % (ref 0.0–14.0)
NEUT%: 76.6 % — ABNORMAL HIGH (ref 39.0–75.0)
NEUTROS ABS: 1.8 10*3/uL (ref 1.5–6.5)
Platelets: 109 10*3/uL — ABNORMAL LOW (ref 140–400)
RBC: 3.91 10*6/uL — AB (ref 4.20–5.82)
RDW: 15.4 % — AB (ref 11.0–14.6)
WBC: 2.4 10*3/uL — ABNORMAL LOW (ref 4.0–10.3)

## 2015-03-05 LAB — COMPREHENSIVE METABOLIC PANEL (CC13)
ALT: 21 U/L (ref 0–55)
AST: 23 U/L (ref 5–34)
Albumin: 3 g/dL — ABNORMAL LOW (ref 3.5–5.0)
Alkaline Phosphatase: 95 U/L (ref 40–150)
Anion Gap: 11 mEq/L (ref 3–11)
BILIRUBIN TOTAL: 1.19 mg/dL (ref 0.20–1.20)
BUN: 24.1 mg/dL (ref 7.0–26.0)
CHLORIDE: 103 meq/L (ref 98–109)
CO2: 25 meq/L (ref 22–29)
Calcium: 8.6 mg/dL (ref 8.4–10.4)
Creatinine: 0.8 mg/dL (ref 0.7–1.3)
EGFR: 83 mL/min/{1.73_m2} — AB (ref >90)
GLUCOSE: 107 mg/dL (ref 70–140)
Potassium: 4.1 mEq/L (ref 3.5–5.1)
SODIUM: 138 meq/L (ref 136–145)
Total Protein: 6.3 g/dL — ABNORMAL LOW (ref 6.4–8.3)

## 2015-03-06 ENCOUNTER — Ambulatory Visit
Admission: RE | Admit: 2015-03-06 | Discharge: 2015-03-06 | Disposition: A | Discharge status: Home / Self Care | Payer: Medicare Other | Source: Ambulatory Visit | Attending: Radiation Oncology | Admitting: Radiation Oncology

## 2015-03-06 DIAGNOSIS — C3492 Malignant neoplasm of unspecified part of left bronchus or lung: Secondary | ICD-10-CM | POA: Diagnosis not present

## 2015-03-06 NOTE — Progress Notes (Signed)
Patient presented to the clinic today requesting to be seen by this RN. Patient reports fatigue. Explained this is to be expected at this stage of treatment. Encouraged patient to get at least 8-10 hours of rest each night, eat healthy and exercise for 30 minutes each day. Patient questions low WBC of 2.4. Explained labs would be reaccessed by medical oncology in one week but, in the mean time to avoid crowds and people who are sick. Encouraged him to wash his hands routinely and avoid fresh flowers. Patient verbalized understanding.

## 2015-03-06 NOTE — Progress Notes (Signed)
  Radiation Oncology         ([phone]) [phone] ________________________________  Name: Luke Contreras  MRN: [id number]  Date: 03/07/2015  DOB: 11-16-1930     Weekly Radiation Therapy Management    ICD-9-CM ICD-10-CM   1. 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 . . . . . . . The patient presents for routine under treatment assessment.                                  Patient presented to the clinic today requesting to be seen by this RN. Patient reports fatigue. Explained this is to be expected at this stage of treatment. Encouraged patient to get at least 8-10 hours of rest each night, eat healthy and exercise for 30 minutes each day. Patient questions low WBC of 2.4. Explained labs would be reaccessed by medical oncology in one week but, in the mean time to avoid crowds and people who are sick. Encouraged him to wash his hands routinely and avoid fresh flowers. Patient verbalized understanding.                                  Set-up films were reviewed.                                 The chart was checked. Physical Findings. . .  weight is 179 lb 9.6 oz (81.466 kg). His oral temperature is 97.3 F (36.3 C). His blood pressure is 128/78 and his pulse is 103. His respiration is 20. . Weight essentially stable.  No significant changes. Impression . . . . . . . The patient is tolerating radiation. Plan . . . . . . . . . . . . Continue treatment as planned.    ________________________________  Sheral Apley. Tammi Klippel, M.D.

## 2015-03-07 ENCOUNTER — Encounter: Payer: Self-pay | Admitting: Radiation Oncology

## 2015-03-07 ENCOUNTER — Ambulatory Visit
Admission: RE | Admit: 2015-03-07 | Discharge: 2015-03-07 | Disposition: A | Discharge status: Home / Self Care | Payer: Medicare Other | Source: Ambulatory Visit | Attending: Radiation Oncology | Admitting: Radiation Oncology

## 2015-03-07 VITALS — BP 128/78 | HR 103 | Temp 97.3°F | Resp 20 | Wt 179.6 lb

## 2015-03-07 DIAGNOSIS — C7931 Secondary malignant neoplasm of brain: Secondary | ICD-10-CM

## 2015-03-07 DIAGNOSIS — C3492 Malignant neoplasm of unspecified part of left bronchus or lung: Secondary | ICD-10-CM | POA: Diagnosis not present

## 2015-03-07 NOTE — Progress Notes (Addendum)
Patient reports he feels less fatigued today than yesterday, has loss of appetite due to "foods not tasting good". He's had 10 lb weight loss in past week. He drinks Ensure 1 can daily, encouraged him to increase to 2 cans and drink through straw which may help with taste issues. He also reports skin irritation on chest with itching. He is applying Biafine twice daily, instructed to increase to 3 times daily. Also suggested he may try 1% Cortisone cream to areas that itch. His chest has erythema with dry desquamation.  He states Lidocaine and Sucralfate very helpful with painful swallowing. No further c/o verbalized today.  BP 128/78 mmHg  Pulse 103  Temp(Src) 97.3 F (36.3 C) (Oral)  Resp 20  Wt 179 lb 9.6 oz (81.466 kg)

## 2015-03-08 ENCOUNTER — Ambulatory Visit
Admission: RE | Admit: 2015-03-08 | Discharge: 2015-03-08 | Disposition: A | Discharge status: Home / Self Care | Payer: Medicare Other | Source: Ambulatory Visit | Attending: Radiation Oncology | Admitting: Radiation Oncology

## 2015-03-08 ENCOUNTER — Ambulatory Visit: Payer: Medicare Other | Admitting: Nutrition

## 2015-03-08 DIAGNOSIS — C3492 Malignant neoplasm of unspecified part of left bronchus or lung: Secondary | ICD-10-CM

## 2015-03-08 MED ORDER — BIAFINE EX EMUL
Freq: Every day | CUTANEOUS | Status: DC
  Administered 2015-03-08: 11:00:00 via TOPICAL

## 2015-03-08 NOTE — Progress Notes (Signed)
81 year old male diagnosed with lung cancer with brain metastases.  Past medical history includes MI, atrial fibrillation, dyslipidemia, hypertension, CAD, GERD, and arthritis.  Medications include Lipitor, vitamin B12, Decadron, Folvite, Prilosec, Compazine, and Carafate.  Labs include albumin 3.0 on March 22.  Height: 69 inches. Weight: 179.6 pounds Usual body weight: 195 pounds. BMI: 26.51.  Patient reports the skin on his chest continues to break down from radiation therapy.  He reports it is painful. He does have swallowing difficulties. He complains of taste alterations.   He is trying to drink at least one Ensure daily.  Nutrition Diagnosis: Unintended weight loss related to metastatic lung cancer and associated treatments as evidenced by 15 pound weight loss from usual body weight.  Intervention:  I educated patient to consume small frequent meals and snacks consisting of high-calorie, high-protein foods.  Fact sheet was provided. Recommended patient drink oral nutrition supplements twice a day between meals.  Provided samples and coupons. Educated patient on choosing soft moist foods for easier swallowing.  Educated him on trying different temperatures of foods. Provided education on strategies for improving taste alterations.  Fact sheets were provided. Questions were answered and teach back method used.  Monitoring, evaluation, goals: Patient will tolerate increased calories and protein to minimize weight loss and improve nutrition impact symptoms.  Next visit: Patient to contact me with concerns or questions.  **Disclaimer: This note was dictated with voice recognition software. Similar sounding words can inadvertently be transcribed and this note may contain transcription errors which may not have been corrected upon publication of note.**

## 2015-03-11 ENCOUNTER — Other Ambulatory Visit: Payer: Self-pay | Admitting: Cardiology

## 2015-03-11 ENCOUNTER — Ambulatory Visit
Admission: RE | Admit: 2015-03-11 | Discharge: 2015-03-11 | Disposition: A | Discharge status: Home / Self Care | Payer: Medicare Other | Source: Ambulatory Visit | Attending: Radiation Oncology | Admitting: Radiation Oncology

## 2015-03-11 DIAGNOSIS — C3492 Malignant neoplasm of unspecified part of left bronchus or lung: Secondary | ICD-10-CM | POA: Diagnosis not present

## 2015-03-11 NOTE — Telephone Encounter (Signed)
Rx has been sent to the pharmacy electronically. ° °

## 2015-03-12 ENCOUNTER — Other Ambulatory Visit: Payer: Self-pay | Admitting: *Deleted

## 2015-03-12 ENCOUNTER — Ambulatory Visit (HOSPITAL_BASED_OUTPATIENT_CLINIC_OR_DEPARTMENT_OTHER): Payer: Medicare Other

## 2015-03-12 ENCOUNTER — Other Ambulatory Visit: Payer: Self-pay | Admitting: Medical Oncology

## 2015-03-12 ENCOUNTER — Other Ambulatory Visit: Payer: Self-pay | Admitting: Internal Medicine

## 2015-03-12 ENCOUNTER — Ambulatory Visit
Admission: RE | Admit: 2015-03-12 | Discharge: 2015-03-12 | Disposition: A | Discharge status: Home / Self Care | Payer: Medicare Other | Source: Ambulatory Visit | Attending: Radiation Oncology | Admitting: Radiation Oncology

## 2015-03-12 ENCOUNTER — Other Ambulatory Visit (HOSPITAL_BASED_OUTPATIENT_CLINIC_OR_DEPARTMENT_OTHER): Payer: Medicare Other

## 2015-03-12 ENCOUNTER — Other Ambulatory Visit: Payer: Self-pay | Admitting: Cardiology

## 2015-03-12 VITALS — BP 130/85 | HR 104 | Temp 99.1°F

## 2015-03-12 DIAGNOSIS — C787 Secondary malignant neoplasm of liver and intrahepatic bile duct: Secondary | ICD-10-CM

## 2015-03-12 DIAGNOSIS — D701 Agranulocytosis secondary to cancer chemotherapy: Secondary | ICD-10-CM

## 2015-03-12 DIAGNOSIS — T451X5A Adverse effect of antineoplastic and immunosuppressive drugs, initial encounter: Principal | ICD-10-CM

## 2015-03-12 DIAGNOSIS — C3492 Malignant neoplasm of unspecified part of left bronchus or lung: Secondary | ICD-10-CM

## 2015-03-12 DIAGNOSIS — C7931 Secondary malignant neoplasm of brain: Secondary | ICD-10-CM | POA: Diagnosis not present

## 2015-03-12 DIAGNOSIS — C7989 Secondary malignant neoplasm of other specified sites: Secondary | ICD-10-CM

## 2015-03-12 DIAGNOSIS — C3412 Malignant neoplasm of upper lobe, left bronchus or lung: Secondary | ICD-10-CM

## 2015-03-12 DIAGNOSIS — C7951 Secondary malignant neoplasm of bone: Secondary | ICD-10-CM

## 2015-03-12 LAB — COMPREHENSIVE METABOLIC PANEL (CC13)
ALT: 26 U/L (ref 0–55)
ANION GAP: 8 meq/L (ref 3–11)
AST: 36 U/L — ABNORMAL HIGH (ref 5–34)
Albumin: 2.7 g/dL — ABNORMAL LOW (ref 3.5–5.0)
Alkaline Phosphatase: 107 U/L (ref 40–150)
BILIRUBIN TOTAL: 1.08 mg/dL (ref 0.20–1.20)
BUN: 16 mg/dL (ref 7.0–26.0)
CO2: 26 mEq/L (ref 22–29)
Calcium: 8.7 mg/dL (ref 8.4–10.4)
Chloride: 104 mEq/L (ref 98–109)
Creatinine: 0.8 mg/dL (ref 0.7–1.3)
EGFR: 83 mL/min/{1.73_m2} — ABNORMAL LOW (ref >90)
Glucose: 153 mg/dl — ABNORMAL HIGH (ref 70–140)
POTASSIUM: 3.6 meq/L (ref 3.5–5.1)
SODIUM: 138 meq/L (ref 136–145)
Total Protein: 6.1 g/dL — ABNORMAL LOW (ref 6.4–8.3)

## 2015-03-12 LAB — CBC WITH DIFFERENTIAL/PLATELET
BASO%: 0 % (ref 0.0–2.0)
Basophils Absolute: 0 10*3/uL (ref 0.0–0.1)
EOS ABS: 0 10*3/uL (ref 0.0–0.5)
EOS%: 4.5 % (ref 0.0–7.0)
HCT: 28.1 % — ABNORMAL LOW (ref 38.4–49.9)
HGB: 9.3 g/dL — ABNORMAL LOW (ref 13.0–17.1)
LYMPH%: 14.6 % (ref 14.0–49.0)
MCH: 28.4 pg (ref 27.2–33.4)
MCHC: 33.1 g/dL (ref 32.0–36.0)
MCV: 85.7 fL (ref 79.3–98.0)
MONO#: 0.2 10*3/uL (ref 0.1–0.9)
MONO%: 19.1 % — ABNORMAL HIGH (ref 0.0–14.0)
NEUT#: 0.6 10*3/uL — ABNORMAL LOW (ref 1.5–6.5)
NEUT%: 61.8 % (ref 39.0–75.0)
Platelets: 45 10*3/uL — ABNORMAL LOW (ref 140–400)
RBC: 3.28 10*6/uL — ABNORMAL LOW (ref 4.20–5.82)
RDW: 15.1 % — ABNORMAL HIGH (ref 11.0–14.6)
WBC: 0.9 10*3/uL — CL (ref 4.0–10.3)
lymph#: 0.1 10*3/uL — ABNORMAL LOW (ref 0.9–3.3)

## 2015-03-12 MED ORDER — TBO-FILGRASTIM 300 MCG/0.5ML ~~LOC~~ SOSY
300.0000 ug | PREFILLED_SYRINGE | Freq: Once | SUBCUTANEOUS | Status: AC
  Administered 2015-03-12: 300 ug via SUBCUTANEOUS
  Filled 2015-03-12: qty 0.5

## 2015-03-12 NOTE — Progress Notes (Signed)
Reviewed neutropenic precautions with patient and wife.

## 2015-03-13 ENCOUNTER — Ambulatory Visit
Admission: RE | Admit: 2015-03-13 | Discharge: 2015-03-13 | Disposition: A | Discharge status: Home / Self Care | Payer: Medicare Other | Source: Ambulatory Visit | Attending: Radiation Oncology | Admitting: Radiation Oncology

## 2015-03-13 ENCOUNTER — Ambulatory Visit (HOSPITAL_BASED_OUTPATIENT_CLINIC_OR_DEPARTMENT_OTHER): Payer: Medicare Other

## 2015-03-13 DIAGNOSIS — T451X5A Adverse effect of antineoplastic and immunosuppressive drugs, initial encounter: Principal | ICD-10-CM

## 2015-03-13 DIAGNOSIS — D701 Agranulocytosis secondary to cancer chemotherapy: Secondary | ICD-10-CM | POA: Diagnosis not present

## 2015-03-13 DIAGNOSIS — C3492 Malignant neoplasm of unspecified part of left bronchus or lung: Secondary | ICD-10-CM | POA: Diagnosis not present

## 2015-03-13 MED ORDER — TBO-FILGRASTIM 300 MCG/0.5ML ~~LOC~~ SOSY
300.0000 ug | PREFILLED_SYRINGE | Freq: Once | SUBCUTANEOUS | Status: AC
  Administered 2015-03-13: 300 ug via SUBCUTANEOUS
  Filled 2015-03-13: qty 0.5

## 2015-03-14 ENCOUNTER — Ambulatory Visit (HOSPITAL_BASED_OUTPATIENT_CLINIC_OR_DEPARTMENT_OTHER): Payer: Medicare Other

## 2015-03-14 ENCOUNTER — Encounter: Payer: Self-pay | Admitting: Radiation Oncology

## 2015-03-14 ENCOUNTER — Telehealth: Payer: Self-pay | Admitting: Radiation Oncology

## 2015-03-14 ENCOUNTER — Ambulatory Visit
Admission: RE | Admit: 2015-03-14 | Discharge: 2015-03-14 | Disposition: A | Discharge status: Home / Self Care | Payer: Medicare Other | Source: Ambulatory Visit | Attending: Radiation Oncology | Admitting: Radiation Oncology

## 2015-03-14 VITALS — BP 120/63 | HR 108 | Temp 97.6°F | Resp 18 | Wt 181.4 lb

## 2015-03-14 DIAGNOSIS — C3492 Malignant neoplasm of unspecified part of left bronchus or lung: Secondary | ICD-10-CM

## 2015-03-14 DIAGNOSIS — Z510 Encounter for antineoplastic radiation therapy: Secondary | ICD-10-CM | POA: Diagnosis not present

## 2015-03-14 DIAGNOSIS — Z87891 Personal history of nicotine dependence: Secondary | ICD-10-CM | POA: Insufficient documentation

## 2015-03-14 DIAGNOSIS — D701 Agranulocytosis secondary to cancer chemotherapy: Secondary | ICD-10-CM

## 2015-03-14 DIAGNOSIS — T451X5A Adverse effect of antineoplastic and immunosuppressive drugs, initial encounter: Principal | ICD-10-CM

## 2015-03-14 LAB — CBC WITH DIFFERENTIAL/PLATELET
BASO%: 0.2 % (ref 0.0–2.0)
Basophils Absolute: 0 10*3/uL (ref 0.0–0.1)
EOS%: 1.4 % (ref 0.0–7.0)
Eosinophils Absolute: 0.1 10*3/uL (ref 0.0–0.5)
HEMATOCRIT: 28.1 % — AB (ref 38.4–49.9)
HGB: 9.1 g/dL — ABNORMAL LOW (ref 13.0–17.1)
LYMPH%: 4.8 % — ABNORMAL LOW (ref 14.0–49.0)
MCH: 27.6 pg (ref 27.2–33.4)
MCHC: 32.3 g/dL (ref 32.0–36.0)
MCV: 85.3 fL (ref 79.3–98.0)
MONO#: 0.5 10*3/uL (ref 0.1–0.9)
MONO%: 11.7 % (ref 0.0–14.0)
NEUT#: 3.8 10*3/uL (ref 1.5–6.5)
NEUT%: 81.9 % — ABNORMAL HIGH (ref 39.0–75.0)
PLATELETS: 73 10*3/uL — AB (ref 140–400)
RBC: 3.3 10*6/uL — ABNORMAL LOW (ref 4.20–5.82)
RDW: 15.2 % — ABNORMAL HIGH (ref 11.0–14.6)
WBC: 4.6 10*3/uL (ref 4.0–10.3)
lymph#: 0.2 10*3/uL — ABNORMAL LOW (ref 0.9–3.3)

## 2015-03-14 MED ORDER — BIAFINE EX EMUL
Freq: Every day | CUTANEOUS | Status: DC
  Administered 2015-03-14: 12:00:00 via TOPICAL

## 2015-03-14 MED ORDER — TBO-FILGRASTIM 300 MCG/0.5ML ~~LOC~~ SOSY
300.0000 ug | PREFILLED_SYRINGE | Freq: Once | SUBCUTANEOUS | Status: AC
  Administered 2015-03-14: 300 ug via SUBCUTANEOUS
  Filled 2015-03-14: qty 0.5

## 2015-03-14 NOTE — Telephone Encounter (Signed)
Spoke with the patient and his son, Kasandra Knudsen. Explained the injections are working and his platelet count is up to 73 today. Patient understands we will resume radiation treatment tomorrow.

## 2015-03-14 NOTE — Addendum Note (Signed)
Encounter addended by: Heywood Footman, RN on: 03/14/2015 11:33 AM<BR>     Documentation filed: [employer], Orders

## 2015-03-14 NOTE — Addendum Note (Signed)
Encounter addended by: Heywood Footman, RN on: 03/14/2015 12:08 PM<BR>     Documentation filed: [employer], Inpatient MAR, Orders

## 2015-03-14 NOTE — Progress Notes (Signed)
  Radiation Oncology         [phone] ________________________________  Name: Luke Contreras MRN: [id number]  Date: 03/14/2015  DOB: 10-Mar-1930  Weekly Radiation Therapy Management  Non-small cell cancer of left lung   Staging form: Lung, AJCC 7th Edition     Clinical stage from 01/24/2015: Stage IV (T3, N3, M1b) - Signed by Curt Bears, MD on 01/27/2015    Current Dose: 48 Gy     Planned Dose:  66 Gy  Narrative . . . . . . . . The patient presents for routine under treatment assessment.                                   The patient is having significant fatigue. His swallowing difficulties have improved over the past week with Carafate.  He denies any chills or fever. The patient is receiving Neupogen in light of his blood counts. He is scheduled for his third injection today.                                 Set-up films were reviewed.                                 The chart was checked. Physical Findings. . .  weight is 181 lb 6.4 oz (82.283 kg). His oral temperature is 97.6 F (36.4 C). His blood pressure is 120/63 and his pulse is 108. His respiration is 18 and oxygen saturation is 92%. . The oral cavity is moist without secondary infection. The lungs are clear to auscultation. The heart has regular rhythm with a slightly increased rate. The skin in the treatment area shows significant erythema without any skin breakdown at this time. Impression . . . . . . . The patient is tolerating radiation. Plan . . . . . . . . . . . . Continue treatment as planned.  The patient will present lab this morning. If he has further lowering of his absolute neutrophil count then we will hold his radiation treatment tomorrow. The patient was given additional Biafine for his skin reaction.  ________________________________   Blair Promise, PhD, MD

## 2015-03-14 NOTE — Progress Notes (Signed)
Reports discomfort with swallowing is less since using Carafate. Hoarseness continues. Reports a persistent cough with thick stringy sputum. Denies hemoptysis. Denies headache, nausea, vomiting or dizziness. Reports fatigue. Denies ringing in the ears. Sternal pain is less. Hyperpigmentation with dry desquamation of right upper chest noted. Reports using Biafine as directed.

## 2015-03-15 ENCOUNTER — Ambulatory Visit
Admission: RE | Admit: 2015-03-15 | Discharge: 2015-03-15 | Disposition: A | Discharge status: Home / Self Care | Payer: Medicare Other | Source: Ambulatory Visit | Attending: Radiation Oncology | Admitting: Radiation Oncology

## 2015-03-15 DIAGNOSIS — C3492 Malignant neoplasm of unspecified part of left bronchus or lung: Secondary | ICD-10-CM | POA: Diagnosis not present

## 2015-03-18 ENCOUNTER — Ambulatory Visit
Admission: RE | Admit: 2015-03-18 | Discharge: 2015-03-18 | Disposition: A | Discharge status: Home / Self Care | Payer: Medicare Other | Source: Ambulatory Visit | Attending: Radiation Oncology | Admitting: Radiation Oncology

## 2015-03-18 DIAGNOSIS — C3492 Malignant neoplasm of unspecified part of left bronchus or lung: Secondary | ICD-10-CM | POA: Diagnosis not present

## 2015-03-19 ENCOUNTER — Ambulatory Visit
Admission: RE | Admit: 2015-03-19 | Discharge: 2015-03-19 | Disposition: A | Discharge status: Home / Self Care | Payer: Medicare Other | Source: Ambulatory Visit | Attending: Radiation Oncology | Admitting: Radiation Oncology

## 2015-03-19 ENCOUNTER — Ambulatory Visit (HOSPITAL_BASED_OUTPATIENT_CLINIC_OR_DEPARTMENT_OTHER): Payer: Medicare Other

## 2015-03-19 ENCOUNTER — Other Ambulatory Visit (HOSPITAL_BASED_OUTPATIENT_CLINIC_OR_DEPARTMENT_OTHER): Payer: Medicare Other

## 2015-03-19 ENCOUNTER — Encounter: Payer: Self-pay | Admitting: Physician Assistant

## 2015-03-19 ENCOUNTER — Ambulatory Visit (HOSPITAL_BASED_OUTPATIENT_CLINIC_OR_DEPARTMENT_OTHER): Payer: Medicare Other | Admitting: Physician Assistant

## 2015-03-19 VITALS — BP 132/70 | HR 96 | Temp 98.0°F | Resp 18 | Ht 69.0 in | Wt 179.7 lb

## 2015-03-19 DIAGNOSIS — C3492 Malignant neoplasm of unspecified part of left bronchus or lung: Secondary | ICD-10-CM | POA: Diagnosis not present

## 2015-03-19 DIAGNOSIS — Z5111 Encounter for antineoplastic chemotherapy: Secondary | ICD-10-CM

## 2015-03-19 DIAGNOSIS — K5900 Constipation, unspecified: Secondary | ICD-10-CM | POA: Diagnosis not present

## 2015-03-19 DIAGNOSIS — C3412 Malignant neoplasm of upper lobe, left bronchus or lung: Secondary | ICD-10-CM

## 2015-03-19 DIAGNOSIS — C7931 Secondary malignant neoplasm of brain: Secondary | ICD-10-CM

## 2015-03-19 LAB — CBC WITH DIFFERENTIAL/PLATELET
BASO%: 0.1 % (ref 0.0–2.0)
Basophils Absolute: 0 10*3/uL (ref 0.0–0.1)
EOS%: 0 % (ref 0.0–7.0)
Eosinophils Absolute: 0 10*3/uL (ref 0.0–0.5)
HEMATOCRIT: 28.7 % — AB (ref 38.4–49.9)
HGB: 9.4 g/dL — ABNORMAL LOW (ref 13.0–17.1)
LYMPH%: 1.8 % — AB (ref 14.0–49.0)
MCH: 28.2 pg (ref 27.2–33.4)
MCHC: 32.8 g/dL (ref 32.0–36.0)
MCV: 86.2 fL (ref 79.3–98.0)
MONO#: 0.6 10*3/uL (ref 0.1–0.9)
MONO%: 6.2 % (ref 0.0–14.0)
NEUT#: 9.2 10*3/uL — ABNORMAL HIGH (ref 1.5–6.5)
NEUT%: 91.9 % — AB (ref 39.0–75.0)
Platelets: 226 10*3/uL (ref 140–400)
RBC: 3.33 10*6/uL — ABNORMAL LOW (ref 4.20–5.82)
RDW: 17.3 % — ABNORMAL HIGH (ref 11.0–14.6)
WBC: 10 10*3/uL (ref 4.0–10.3)
lymph#: 0.2 10*3/uL — ABNORMAL LOW (ref 0.9–3.3)

## 2015-03-19 LAB — COMPREHENSIVE METABOLIC PANEL (CC13)
ALK PHOS: 121 U/L (ref 40–150)
ALT: 22 U/L (ref 0–55)
AST: 23 U/L (ref 5–34)
Albumin: 2.8 g/dL — ABNORMAL LOW (ref 3.5–5.0)
Anion Gap: 13 mEq/L — ABNORMAL HIGH (ref 3–11)
BUN: 13.9 mg/dL (ref 7.0–26.0)
CO2: 24 mEq/L (ref 22–29)
CREATININE: 0.9 mg/dL (ref 0.7–1.3)
Calcium: 8.9 mg/dL (ref 8.4–10.4)
Chloride: 102 mEq/L (ref 98–109)
EGFR: 81 mL/min/{1.73_m2} — ABNORMAL LOW (ref >90)
GLUCOSE: 184 mg/dL — AB (ref 70–140)
POTASSIUM: 3.6 meq/L (ref 3.5–5.1)
Sodium: 140 mEq/L (ref 136–145)
Total Bilirubin: 0.63 mg/dL (ref 0.20–1.20)
Total Protein: 6.4 g/dL (ref 6.4–8.3)

## 2015-03-19 MED ORDER — SODIUM CHLORIDE 0.9 % IV SOLN
400.0000 mg/m2 | Freq: Once | INTRAVENOUS | Status: AC
  Administered 2015-03-19: 800 mg via INTRAVENOUS
  Filled 2015-03-19: qty 32

## 2015-03-19 MED ORDER — CYANOCOBALAMIN 1000 MCG/ML IJ SOLN
INTRAMUSCULAR | Status: AC
  Filled 2015-03-19: qty 1

## 2015-03-19 MED ORDER — SODIUM CHLORIDE 0.9 % IV SOLN
Freq: Once | INTRAVENOUS | Status: AC
  Administered 2015-03-19: 13:00:00 via INTRAVENOUS

## 2015-03-19 MED ORDER — CYANOCOBALAMIN 1000 MCG/ML IJ SOLN
1000.0000 ug | Freq: Once | INTRAMUSCULAR | Status: AC
  Administered 2015-03-19: 1000 ug via INTRAMUSCULAR

## 2015-03-19 MED ORDER — SODIUM CHLORIDE 0.9 % IV SOLN
460.0000 mg | Freq: Once | INTRAVENOUS | Status: AC
  Administered 2015-03-19: 460 mg via INTRAVENOUS
  Filled 2015-03-19: qty 46

## 2015-03-19 MED ORDER — SODIUM CHLORIDE 0.9 % IV SOLN
Freq: Once | INTRAVENOUS | Status: AC
  Administered 2015-03-19: 13:00:00 via INTRAVENOUS
  Filled 2015-03-19: qty 8

## 2015-03-19 NOTE — Progress Notes (Addendum)
[facility] [phone]   Fax:(336) [place], [place] [address]  DIAGNOSIS: Metastatic non-small cell lung cancer, poorly differentiated adenocarcinoma diagnosed in January 2016.   PRIOR THERAPY: Status post SRS treatment to single brain lesion under the care of Dr. Tammi Klippel.  CURRENT THERAPY:  1. Systemic chemotherapy with carboplatin for an AUC of 5 and Alimta at 500 mg/m given every 3 weeks. Status post 2 cycles. 2. Palliative radiation therapy under the care of the [facility]. Last fraction of radiation therapy expected 03/27/2015.   INTERVAL HISTORY: Luke Contreras 81 y.o. male returns to the clinic today for follow-up visit accompanied by his wife. The patient is feeling fine today with no specific complaints except for the baseline shortness of breath in addition to hoarseness of his voice. He is tolerating his chemotherapy relatively well but is having significant fatigue associated with radiation therapy. He will complete his radiation therapy on 03/27/2015.  He continues to have some nausea after radiation therapy. He denied having any significant weight loss or night sweats. He has no chest pain but continues to have shortness breath with exertion with mild cough with no hemoptysis. The patient denied having any current significant nausea or vomiting.   MEDICAL HISTORY: Past Medical History  Diagnosis Date  . History of acute anterior wall myocardial infarction 11/2006  . Atrial fibrillation   . Dyslipidemia   . Hypertension   . Coronary artery disease     history of  obstructive single-valve disease  . PAC (premature atrial contraction)   . Left ventricular dysfunction     withEF of 40-45%   . Cancer     skin cancer- side of nausea  . Complication of anesthesia   . Shortness of breath dyspnea     " when talking and completing a long sentence  . GERD (gastroesophageal  reflux disease)   . Arthritis     hands, knees    ALLERGIES:  is allergic to cephalexin and aspirin.  MEDICATIONS:  Current Outpatient Prescriptions  Medication Sig Dispense Refill  . alum & mag hydroxide-simeth (MAALOX/MYLANTA) 200-200-20 MG/5ML suspension Take by mouth every 6 (six) hours as needed for indigestion or heartburn.    Marland Kitchen aspirin 325 MG EC tablet Take 325 mg by mouth daily.    Marland Kitchen atorvastatin (LIPITOR) 40 MG tablet TAKE 1/2 TABLET (= 20 MG   TOTAL) DAILY 45 tablet 2  . clorazepate (TRANXENE) 7.5 MG tablet Take 7.5 mg by mouth at bedtime.      . Cyanocobalamin (VITAMIN B-12 PO) Take 500 mcg by mouth daily.     Marland Kitchen dexamethasone (DECADRON) 4 MG tablet Take 1 tablet by mouth twice daily with food the day before, the day of and the day after chemotherapy 30 tablet 1  . Dextromethorphan-Guaifenesin (CORICIDIN HBP CONGESTION/COUGH PO) Take by mouth daily as needed.    Marland Kitchen emollient (BIAFINE) cream Apply topically as needed.    . fluorouracil (EFUDEX) 5 % cream Apply 1 application topically daily.   0  . folic acid (FOLVITE) 1 MG tablet Take 1 tablet (1 mg total) by mouth daily. 30 tablet 3  . lidocaine (XYLOCAINE) 2 % solution Patient: Mix 1part 2% viscous lidocaine, 1part H20. Swish and/or swallow 74mL of this mixture, 17min before meals and at bedtime, up to QID 100 mL 1  . Misc Natural Products (OSTEO BI-FLEX JOINT SHIELD PO) Take 1 tablet by  mouth daily.      . multivitamin (THERAGRAN) per tablet Take 1 tablet by mouth daily.      . nitroGLYCERIN (NITROSTAT) 0.4 MG SL tablet Place 1 tablet (0.4 mg total) under the tongue every 5 (five) minutes as needed for chest pain. 25 tablet 5  . omeprazole (PRILOSEC) 20 MG capsule Take 20 mg by mouth daily.     . prochlorperazine (COMPAZINE) 10 MG tablet Take 1 tablet (10 mg total) by mouth every 6 (six) hours as needed for nausea or vomiting. 30 tablet 1  . sucralfate (CARAFATE) 1 G tablet Dissolve 1 tablet in 10 mL H20 and swallow up to QID, in  between meals, to soothe esophagus. 60 tablet 1  . Wound Cleansers (RADIAPLEX EX) Apply topically.    Marland Kitchen lisinopril (PRINIVIL,ZESTRIL) 2.5 MG tablet Take 1 tablet (2.5 mg total) by mouth daily. (Patient not taking: Reported on 03/19/2015) 90 tablet 0   No current facility-administered medications for this visit.    SURGICAL HISTORY:  Past Surgical History  Procedure Laterality Date  . Cardiac catheterization  11/26/2006    Est. EF of 40% --  A 100% obstructive proximal/mid left anterior descending artery consistent with recent anterior myocardial infarction --  Moderate mid right coronary artery disease --Mild left ventricular dysfunction with EF of 40%  with distal anterior akinesis and periapical dyskinesis    -- Kaylyn Lim., M.D.  . Colonoscopy    . Video bronchoscopy with endobronchial ultrasound N/A 01/11/2015    Procedure: VIDEO BRONCHOSCOPY WITH ENDOBRONCHIAL ULTRASOUND;  Surgeon: Grace Isaac, MD;  Location: [facility];  Service: Thoracic;  Laterality: N/A;  . Lung biopsy N/A 01/11/2015    Procedure: LUNG BIOPSY;  Surgeon: Grace Isaac, MD;  Location: [facility];  Service: Thoracic;  Laterality: N/A;    REVIEW OF SYSTEMS:  Constitutional: negative Eyes: negative Ears, nose, mouth, throat, and face: positive for hoarseness Respiratory: positive for cough and dyspnea on exertion Cardiovascular: negative Gastrointestinal: positive for constipation and nausea Genitourinary:negative Integument/breast: negative Hematologic/lymphatic: negative Musculoskeletal:negative Neurological: negative Behavioral/Psych: negative Endocrine: negative Allergic/Immunologic: negative   PHYSICAL EXAMINATION: General appearance: alert, cooperative, fatigued and no distress Head: Normocephalic, without obvious abnormality, atraumatic Neck: no adenopathy, no JVD, supple, symmetrical, trachea midline and thyroid not enlarged, symmetric, no tenderness/mass/nodules Lymph nodes: Cervical,  supraclavicular, and axillary nodes normal. Resp: clear to auscultation bilaterally Back: symmetric, no curvature. ROM normal. No CVA tenderness. Cardio: regular rate and rhythm, S1, S2 normal, no murmur, click, rub or gallop GI: soft, non-tender; bowel sounds normal; no masses,  no organomegaly Extremities: extremities normal, atraumatic, no cyanosis or edema Neurologic: Alert and oriented X 3, normal strength and tone. Normal symmetric reflexes. Normal coordination and gait  ECOG PERFORMANCE STATUS: 1 - Symptomatic but completely ambulatory  Blood pressure 132/70, pulse 96, temperature 98 F (36.7 C), temperature source Oral, resp. rate 18, height 5\' 9"  (1.753 m), weight 179 lb 11.2 oz (81.511 kg), SpO2 97 %.  LABORATORY DATA: Lab Results  Component Value Date   WBC 10.0 03/19/2015   HGB 9.4* 03/19/2015   HCT 28.7* 03/19/2015   MCV 86.2 03/19/2015   PLT 226 03/19/2015      Chemistry      Component Value Date/Time   NA 140 03/19/2015 1112   NA 142 01/11/2015 0726   K 3.6 03/19/2015 1112   K 3.6 01/11/2015 0726   CL 105 01/11/2015 0726   CO2 24 03/19/2015 1112   CO2 32 01/11/2015 0726  BUN 13.9 03/19/2015 1112   BUN 10 01/11/2015 0726   CREATININE 0.9 03/19/2015 1112   CREATININE 0.93 01/11/2015 0726   CREATININE 0.88 12/29/2014 1207      Component Value Date/Time   CALCIUM 8.9 03/19/2015 1112   CALCIUM 8.9 01/11/2015 0726   ALKPHOS 121 03/19/2015 1112   ALKPHOS 106 01/11/2015 0726   AST 23 03/19/2015 1112   AST 26 01/11/2015 0726   ALT 22 03/19/2015 1112   ALT 14 01/11/2015 0726   BILITOT 0.63 03/19/2015 1112   BILITOT 1.1 01/11/2015 0726       RADIOGRAPHIC STUDIES: No results found.  ASSESSMENT AND PLAN: This is a very pleasant 81 years old white male recently diagnosed with stage IV non-small cell lung cancer, poorly differentiated adenocarcinoma. He has a single 31mm ringing like enhancing lesion bordering the posterior aspect of the right temporal horn of  the right lateral ventricle. He is status post SRS treatment to the single brain lesion. He is currently undergoing palliative radiotherapy to the left upper lobe and hilar mass. Last fraction of radiation therapy expected 03/27/2015. He is status post 2 cycles of systemic chemotherapy with carboplatin and Alimta and overall tolerated this chemotherapy relatively well. He did have some mild constipation but his increased the fiber in his diet with whole grain Cheerios and oatmeal with good results. The patient was discussed with and also seen by Dr. Julien Nordmann. He'll proceed with cycle #3 today as scheduled. He will continue with weekly labs as scheduled. He'll follow-up in 3 weeks prior to the start of cycle #4 with a restaging CT scan of the chest, with contrast to reevaluate his disease.   He was advised to call immediately if he has any concerning symptoms in the interval. The patient voices understanding of current disease status and treatment options and is in agreement with the current care plan.  All questions were answered. The patient knows to call the clinic with any problems, questions or concerns. We can certainly see the patient much sooner if necessary.  Carlton Adam, PA-C 03/19/2015  ADDENDUM: Hematology/Oncology Attending:  I had a face to face encounter with the patient. I recommended his care plan. This is a very pleasant 81 years old white male with metastatic non-small cell lung cancer currently undergoing systemic chemotherapy was carboplatin and Alimta status post 2 cycles and tolerating his treatment fairly well with no significant adverse effects. The patient is also receiving palliative radiotherapy to the left upper lobe and hilar mass which is expected to be completed next week. I recommended for him to proceed with cycle #3 today as a scheduled. The patient would come back for follow-up visit in 3 weeks for reevaluation after repeating CT scan of the chest, abdomen and  pelvis for restaging of his disease. He was advised to call immediately if he has any concerning symptoms in the interval.  Disclaimer: This note was dictated with voice recognition software. Similar sounding words can inadvertently be transcribed and may be missed upon review. Eilleen Kempf., MD 03/24/2015

## 2015-03-19 NOTE — Patient Instructions (Signed)
Carboplatin injection What is this medicine? CARBOPLATIN (KAR boe pla tin) is a chemotherapy drug. It targets fast dividing cells, like cancer cells, and causes these cells to die. This medicine is used to treat ovarian cancer and many other cancers. This medicine may be used for other purposes; ask your health care provider or pharmacist if you have questions. COMMON BRAND NAME(S): Paraplatin What should I tell my health care provider before I take this medicine? They need to know if you have any of these conditions: -blood disorders -hearing problems -kidney disease -recent or ongoing radiation therapy -an unusual or allergic reaction to carboplatin, cisplatin, other chemotherapy, other medicines, foods, dyes, or preservatives -pregnant or trying to get pregnant -breast-feeding How should I use this medicine? This drug is usually given as an infusion into a vein. It is administered in a hospital or clinic by a specially trained health care professional. Talk to your pediatrician regarding the use of this medicine in children. Special care may be needed. Overdosage: If you think you have taken too much of this medicine contact a poison control center or emergency room at once. NOTE: This medicine is only for you. Do not share this medicine with others. What if I miss a dose? It is important not to miss a dose. Call your doctor or health care professional if you are unable to keep an appointment. What may interact with this medicine? -medicines for seizures -medicines to increase blood counts like filgrastim, pegfilgrastim, sargramostim -some antibiotics like amikacin, gentamicin, neomycin, streptomycin, tobramycin -vaccines Talk to your doctor or health care professional before taking any of these medicines: -acetaminophen -aspirin -ibuprofen -ketoprofen -naproxen This list may not describe all possible interactions. Give your health care provider a list of all the medicines, herbs,  non-prescription drugs, or dietary supplements you use. Also tell them if you smoke, drink alcohol, or use illegal drugs. Some items may interact with your medicine. What should I watch for while using this medicine? Your condition will be monitored carefully while you are receiving this medicine. You will need important blood work done while you are taking this medicine. This drug may make you feel generally unwell. This is not uncommon, as chemotherapy can affect healthy cells as well as cancer cells. Report any side effects. Continue your course of treatment even though you feel ill unless your doctor tells you to stop. In some cases, you may be given additional medicines to help with side effects. Follow all directions for their use. Call your doctor or health care professional for advice if you get a fever, chills or sore throat, or other symptoms of a cold or flu. Do not treat yourself. This drug decreases your body's ability to fight infections. Try to avoid being around people who are sick. This medicine may increase your risk to bruise or bleed. Call your doctor or health care professional if you notice any unusual bleeding. Be careful brushing and flossing your teeth or using a toothpick because you may get an infection or bleed more easily. If you have any dental work done, tell your dentist you are receiving this medicine. Avoid taking products that contain aspirin, acetaminophen, ibuprofen, naproxen, or ketoprofen unless instructed by your doctor. These medicines may hide a fever. Do not become pregnant while taking this medicine. Women should inform their doctor if they wish to become pregnant or think they might be pregnant. There is a potential for serious side effects to an unborn child. Talk to your health care professional or  pharmacist for more information. Do not breast-feed an infant while taking this medicine. What side effects may I notice from receiving this medicine? Side effects  that you should report to your doctor or health care professional as soon as possible: -allergic reactions like skin rash, itching or hives, swelling of the face, lips, or tongue -signs of infection - fever or chills, cough, sore throat, pain or difficulty passing urine -signs of decreased platelets or bleeding - bruising, pinpoint red spots on the skin, black, tarry stools, nosebleeds -signs of decreased red blood cells - unusually weak or tired, fainting spells, lightheadedness -breathing problems -changes in hearing -changes in vision -chest pain -high blood pressure -low blood counts - This drug may decrease the number of white blood cells, red blood cells and platelets. You may be at increased risk for infections and bleeding. -nausea and vomiting -pain, swelling, redness or irritation at the injection site -pain, tingling, numbness in the hands or feet -problems with balance, talking, walking -trouble passing urine or change in the amount of urine Side effects that usually do not require medical attention (report to your doctor or health care professional if they continue or are bothersome): -hair loss -loss of appetite -metallic taste in the mouth or changes in taste This list may not describe all possible side effects. Call your doctor for medical advice about side effects. You may report side effects to FDA at 1-800-FDA-1088. Where should I keep my medicine? This drug is given in a hospital or clinic and will not be stored at home. NOTE: This sheet is a summary. It may not cover all possible information. If you have questions about this medicine, talk to your doctor, pharmacist, or health care provider.  2015, Elsevier/Gold Standard. (2008-03-06 14:38:05) Pemetrexed injection What is this medicine? PEMETREXED (PEM e TREX ed) is a chemotherapy drug. This medicine affects cells that are rapidly growing, such as cancer cells and cells in your mouth and stomach. It is usually used to  treat lung cancers like non-small cell lung cancer and mesothelioma. It may also be used to treat other cancers. This medicine may be used for other purposes; ask your health care provider or pharmacist if you have questions. COMMON BRAND NAME(S): Alimta What should I tell my health care provider before I take this medicine? They need to know if you have any of these conditions: -if you frequently drink alcohol containing beverages -infection (especially a virus infection such as chickenpox, cold sores, or herpes) -kidney disease -liver disease -low blood counts, like low platelets, red bloods, or white blood cells -an unusual or allergic reaction to pemetrexed, mannitol, other medicines, foods, dyes, or preservatives -pregnant or trying to get pregnant -breast-feeding How should I use this medicine? This drug is given as an infusion into a vein. It is administered in a hospital or clinic by a specially trained health care professional. Talk to your pediatrician regarding the use of this medicine in children. Special care may be needed. Overdosage: If you think you have taken too much of this medicine contact a poison control center or emergency room at once. NOTE: This medicine is only for you. Do not share this medicine with others. What if I miss a dose? It is important not to miss your dose. Call your doctor or health care professional if you are unable to keep an appointment. What may interact with this medicine? -aspirin and aspirin-like medicines -medicines to increase blood counts like filgrastim, pegfilgrastim, sargramostim -methotrexate -NSAIDS, medicines for pain  and inflammation, like ibuprofen or naproxen -probenecid -pyrimethamine -vaccines Talk to your doctor or health care professional before taking any of these medicines: -acetaminophen -aspirin -ibuprofen -ketoprofen -naproxen This list may not describe all possible interactions. Give your health care provider a  list of all the medicines, herbs, non-prescription drugs, or dietary supplements you use. Also tell them if you smoke, drink alcohol, or use illegal drugs. Some items may interact with your medicine. What should I watch for while using this medicine? Visit your doctor for checks on your progress. This drug may make you feel generally unwell. This is not uncommon, as chemotherapy can affect healthy cells as well as cancer cells. Report any side effects. Continue your course of treatment even though you feel ill unless your doctor tells you to stop. In some cases, you may be given additional medicines to help with side effects. Follow all directions for their use. Call your doctor or health care professional for advice if you get a fever, chills or sore throat, or other symptoms of a cold or flu. Do not treat yourself. This drug decreases your body's ability to fight infections. Try to avoid being around people who are sick. This medicine may increase your risk to bruise or bleed. Call your doctor or health care professional if you notice any unusual bleeding. Be careful brushing and flossing your teeth or using a toothpick because you may get an infection or bleed more easily. If you have any dental work done, tell your dentist you are receiving this medicine. Avoid taking products that contain aspirin, acetaminophen, ibuprofen, naproxen, or ketoprofen unless instructed by your doctor. These medicines may hide a fever. Call your doctor or health care professional if you get diarrhea or mouth sores. Do not treat yourself. To protect your kidneys, drink water or other fluids as directed while you are taking this medicine. Men and women must use effective birth control while taking this medicine. You may also need to continue using effective birth control for a time after stopping this medicine. Do not become pregnant while taking this medicine. Tell your doctor right away if you think that you or your partner  might be pregnant. There is a potential for serious side effects to an unborn child. Talk to your health care professional or pharmacist for more information. Do not breast-feed an infant while taking this medicine. This medicine may lower sperm counts. What side effects may I notice from receiving this medicine? Side effects that you should report to your doctor or health care professional as soon as possible: -allergic reactions like skin rash, itching or hives, swelling of the face, lips, or tongue -low blood counts - this medicine may decrease the number of white blood cells, red blood cells and platelets. You may be at increased risk for infections and bleeding. -signs of infection - fever or chills, cough, sore throat, pain or difficulty passing urine -signs of decreased platelets or bleeding - bruising, pinpoint red spots on the skin, black, tarry stools, blood in the urine -signs of decreased red blood cells - unusually weak or tired, fainting spells, lightheadedness -breathing problems, like a dry cough -changes in emotions or moods -chest pain -confusion -diarrhea -high blood pressure -mouth or throat sores or ulcers -pain, swelling, warmth in the leg -pain on swallowing -swelling of the ankles, feet, hands -trouble passing urine or change in the amount of urine -vomiting -yellowing of the eyes or skin Side effects that usually do not require medical attention (report to  your doctor or health care professional if they continue or are bothersome): -hair loss -loss of appetite -nausea -stomach upset This list may not describe all possible side effects. Call your doctor for medical advice about side effects. You may report side effects to FDA at 1-800-FDA-1088. Where should I keep my medicine? This drug is given in a hospital or clinic and will not be stored at home. NOTE: This sheet is a summary. It may not cover all possible information. If you have questions about this  medicine, talk to your doctor, pharmacist, or health care provider.  2015, Elsevier/Gold Standard. (2008-07-03 13:24:03)

## 2015-03-20 ENCOUNTER — Ambulatory Visit
Admission: RE | Admit: 2015-03-20 | Discharge: 2015-03-20 | Disposition: A | Discharge status: Home / Self Care | Payer: Medicare Other | Source: Ambulatory Visit | Attending: Radiation Oncology | Admitting: Radiation Oncology

## 2015-03-20 DIAGNOSIS — C3492 Malignant neoplasm of unspecified part of left bronchus or lung: Secondary | ICD-10-CM | POA: Diagnosis not present

## 2015-03-21 ENCOUNTER — Encounter: Payer: Self-pay | Admitting: *Deleted

## 2015-03-21 ENCOUNTER — Encounter: Payer: Self-pay | Admitting: Radiation Oncology

## 2015-03-21 ENCOUNTER — Ambulatory Visit
Admission: RE | Admit: 2015-03-21 | Discharge: 2015-03-21 | Disposition: A | Discharge status: Home / Self Care | Payer: Medicare Other | Source: Ambulatory Visit | Attending: Radiation Oncology | Admitting: Radiation Oncology

## 2015-03-21 VITALS — BP 123/62 | HR 82 | Resp 16 | Wt 182.7 lb

## 2015-03-21 DIAGNOSIS — C3492 Malignant neoplasm of unspecified part of left bronchus or lung: Secondary | ICD-10-CM

## 2015-03-21 NOTE — Progress Notes (Signed)
Confirms discomfort associated with swallowing is managed well with Carafate. Hoarseness continues but, seems no worse this week. Reports cough is less frequent with Delsym. Denies hemoptysis. Denies headache, dizziness, nausea or vomiting. Reports fatigue. Denies pain in chest. Hyperpigmentation with dry desquamation of right upper chest noted. Reports using Biafine to affected area. Encouraged wife to apply neosporin in addition to Biafine to area of desquamation.

## 2015-03-21 NOTE — [facility] Oncology Navigator Note (Unsigned)
Patient also had questions about schedule and follow up.  Samantha and I clarified questions.

## 2015-03-21 NOTE — [facility] On[facility] Navigator Note (Signed)
Spoke with patient today in [place].  He states he is feeling ok but feels tired at times.  We spoke about energy conservation during the day.  His wife is here and states she is helpful.

## 2015-03-21 NOTE — Progress Notes (Signed)
  Radiation Oncology         [phone] ________________________________  Name: Luke Contreras  MRN: [id number]  Date: 03/21/2015  DOB: [birth year]     Weekly Radiation Therapy Management    ICD-9-CM ICD-10-CM   1. Non-small cell cancer of left lung 162.9 C34.92     Current Dose: 58 Gy     Planned Dose:  66 Gy  Narrative . . . . . . . . The patient presents for routine under treatment assessment.                                  Confirms discomfort associated with swallowing is managed well with Carafate. Hoarseness continues but, seems no worse this week. Reports cough is less frequent with Delsym. Denies hemoptysis. Denies headache, dizziness, nausea or vomiting. Reports fatigue. Denies pain in chest. Hyperpigmentation with dry desquamation of right upper chest noted. Reports using Biafine to affected area. Encouraged wife to apply neosporin in addition to Biafine to area of desquamation.                                 Set-up films were reviewed.                                 The chart was checked. Physical Findings. . .  weight is 182 lb 11.2 oz (82.872 kg). His blood pressure is 123/62 and his pulse is 82. His respiration is 16 and oxygen saturation is 100%. . Weight essentially stable.  No significant changes. Impression . . . . . . . The patient is tolerating radiation. Plan . . . . . . . . . . . . Continue treatment as planned.     This document serves as a record of services personally performed by Tyler Pita, MD. It was created on his behalf by Pearlie Oyster, a trained medical scribe. The creation of this record is based on the scribe's personal observations and the provider's statements to them. This document has been checked and approved by the attending provider.     ------------------------------------------------  Sheral Apley Tammi Klippel, M.D.

## 2015-03-22 ENCOUNTER — Ambulatory Visit
Admission: RE | Admit: 2015-03-22 | Discharge: 2015-03-22 | Disposition: A | Discharge status: Home / Self Care | Payer: Medicare Other | Source: Ambulatory Visit | Attending: Radiation Oncology | Admitting: Radiation Oncology

## 2015-03-22 DIAGNOSIS — C3492 Malignant neoplasm of unspecified part of left bronchus or lung: Secondary | ICD-10-CM | POA: Diagnosis not present

## 2015-03-22 NOTE — Patient Instructions (Signed)
Complete your course of radiation is scheduled Continue weekly labs as scheduled Follow-up in 3 weeks with a restaging CT scan of your chest to reevaluate your disease

## 2015-03-25 ENCOUNTER — Ambulatory Visit
Admission: RE | Admit: 2015-03-25 | Discharge: 2015-03-25 | Disposition: A | Discharge status: Home / Self Care | Payer: Medicare Other | Source: Ambulatory Visit | Attending: Radiation Oncology | Admitting: Radiation Oncology

## 2015-03-25 ENCOUNTER — Telehealth: Payer: Self-pay | Admitting: Cardiology

## 2015-03-25 DIAGNOSIS — C3492 Malignant neoplasm of unspecified part of left bronchus or lung: Secondary | ICD-10-CM | POA: Diagnosis not present

## 2015-03-25 NOTE — Telephone Encounter (Signed)
Pt's son called in stating that taking aspirin is hurting the pt's stomach and he would like to know how necessary is it for him to continue to take this medication and if it is necessary, can his dosage be lowered. Please f/u with him  Thanks

## 2015-03-25 NOTE — Telephone Encounter (Signed)
Returned call to patient's son Kasandra Knudsen.He stated father has been having a lot of stomach discomfort after he takes Aspirin 325 mg.Stated he takes after dinner with mylanta and still has discomfort.Stated he has been taking radiation 5 times a week and has 2 more treatments.Has 1 more chemo treatment.Decreased appetite.Stated he wanted to ask Dr.Jordan if he can just stop Aspirin.Advised will speak to Dr.Jordan and call back.

## 2015-03-25 NOTE — Telephone Encounter (Signed)
Returned call to patient's son Kasandra Knudsen Dr.Jordan advised ok to stop aspirin.

## 2015-03-26 ENCOUNTER — Ambulatory Visit
Admission: RE | Admit: 2015-03-26 | Discharge: 2015-03-26 | Disposition: A | Discharge status: Home / Self Care | Payer: Medicare Other | Source: Ambulatory Visit | Attending: Radiation Oncology | Admitting: Radiation Oncology

## 2015-03-26 ENCOUNTER — Other Ambulatory Visit (HOSPITAL_BASED_OUTPATIENT_CLINIC_OR_DEPARTMENT_OTHER): Payer: Medicare Other

## 2015-03-26 DIAGNOSIS — C3412 Malignant neoplasm of upper lobe, left bronchus or lung: Secondary | ICD-10-CM | POA: Diagnosis present

## 2015-03-26 DIAGNOSIS — C7931 Secondary malignant neoplasm of brain: Secondary | ICD-10-CM | POA: Diagnosis not present

## 2015-03-26 DIAGNOSIS — C3492 Malignant neoplasm of unspecified part of left bronchus or lung: Secondary | ICD-10-CM | POA: Diagnosis not present

## 2015-03-26 LAB — CBC WITH DIFFERENTIAL/PLATELET
BASO%: 0 % (ref 0.0–2.0)
BASOS ABS: 0 10*3/uL (ref 0.0–0.1)
EOS ABS: 0 10*3/uL (ref 0.0–0.5)
EOS%: 0 % (ref 0.0–7.0)
HEMATOCRIT: 26.9 % — AB (ref 38.4–49.9)
HGB: 8.8 g/dL — ABNORMAL LOW (ref 13.0–17.1)
LYMPH#: 0.1 10*3/uL — AB (ref 0.9–3.3)
LYMPH%: 5.7 % — ABNORMAL LOW (ref 14.0–49.0)
MCH: 28.2 pg (ref 27.2–33.4)
MCHC: 32.7 g/dL (ref 32.0–36.0)
MCV: 86.2 fL (ref 79.3–98.0)
MONO#: 0.1 10*3/uL (ref 0.1–0.9)
MONO%: 4.8 % (ref 0.0–14.0)
NEUT%: 89.5 % — AB (ref 39.0–75.0)
NEUTROS ABS: 2 10*3/uL (ref 1.5–6.5)
Platelets: 129 10*3/uL — ABNORMAL LOW (ref 140–400)
RBC: 3.12 10*6/uL — ABNORMAL LOW (ref 4.20–5.82)
RDW: 17.9 % — ABNORMAL HIGH (ref 11.0–14.6)
WBC: 2.3 10*3/uL — AB (ref 4.0–10.3)

## 2015-03-26 LAB — COMPREHENSIVE METABOLIC PANEL (CC13)
ALK PHOS: 94 U/L (ref 40–150)
ALT: 20 U/L (ref 0–55)
AST: 24 U/L (ref 5–34)
Albumin: 2.8 g/dL — ABNORMAL LOW (ref 3.5–5.0)
Anion Gap: 9 mEq/L (ref 3–11)
BILIRUBIN TOTAL: 1.45 mg/dL — AB (ref 0.20–1.20)
BUN: 20.4 mg/dL (ref 7.0–26.0)
CO2: 28 mEq/L (ref 22–29)
Calcium: 8.3 mg/dL — ABNORMAL LOW (ref 8.4–10.4)
Chloride: 99 mEq/L (ref 98–109)
Creatinine: 0.7 mg/dL (ref 0.7–1.3)
EGFR: 85 mL/min/{1.73_m2} — ABNORMAL LOW (ref >90)
Glucose: 120 mg/dl (ref 70–140)
Potassium: 3.7 mEq/L (ref 3.5–5.1)
Sodium: 136 mEq/L (ref 136–145)
Total Protein: 6.2 g/dL — ABNORMAL LOW (ref 6.4–8.3)

## 2015-03-27 ENCOUNTER — Encounter: Payer: Self-pay | Admitting: Radiation Oncology

## 2015-03-27 ENCOUNTER — Ambulatory Visit
Admission: RE | Admit: 2015-03-27 | Discharge: 2015-03-27 | Disposition: A | Discharge status: Home / Self Care | Payer: Medicare Other | Source: Ambulatory Visit | Attending: Radiation Oncology | Admitting: Radiation Oncology

## 2015-03-27 VITALS — BP 103/57 | HR 121 | Temp 98.2°F | Resp 16 | Wt 175.1 lb

## 2015-03-27 DIAGNOSIS — C3492 Malignant neoplasm of unspecified part of left bronchus or lung: Secondary | ICD-10-CM | POA: Diagnosis not present

## 2015-03-27 MED ORDER — BIAFINE EX EMUL
Freq: Every day | CUTANEOUS | Status: DC
  Administered 2015-03-27: 13:00:00 via TOPICAL

## 2015-03-27 MED ORDER — OXYCODONE-ACETAMINOPHEN 5-325 MG PO TABS: 1.0000 | ORAL_TABLET | ORAL | Status: AC | PRN

## 2015-03-27 NOTE — Progress Notes (Signed)
Patient completed radiation treatment today. One month follow up scheduled for 5/25. Patient reports feeling very weak today. Patient being pushed in wheelchair by his wife because he is so weak. Reports increased reflux. Reports taking Mylanta for short term relief but, questions if another medication would help more and last longer. Hyperpigmentation with dry desquamation of right upper chest noted. Provided wife with additional tube of biafine. Both understand to continue to use biafine bid for the next two week. Pain with swallowing continues despite using carafate. Denies hemoptysis. Reports cough continues. Denies headache, dizziness, nausea or vomiting. Reports fatigue. Encouraged patient to sip water and tea frequently throughout the day. 7 lb weight loss noted. BP lower than normal and tachycardic. Patient taking in one can of ensure per day. Afebrile. Reports persistent burning pain center of chest.

## 2015-03-27 NOTE — Progress Notes (Signed)
Department of Radiation Oncology  Phone:  [phone] Fax:        [phone]  Weekly Treatment Note    Name: Luke Contreras Date: 03/27/2015 MRN: [id number] DOB: [birth year]   Current dose: 66 Gy  Current fraction: 33   MEDICATIONS: Current Outpatient Prescriptions  Medication Sig Dispense Refill  . alum & mag hydroxide-simeth (MAALOX/MYLANTA) 200-200-20 MG/5ML suspension Take by mouth every 6 (six) hours as needed for indigestion or heartburn.    Marland Kitchen atorvastatin (LIPITOR) 40 MG tablet TAKE 1/2 TABLET (= 20 MG   TOTAL) DAILY 45 tablet 2  . clorazepate (TRANXENE) 7.5 MG tablet Take 7.5 mg by mouth at bedtime.      . Cyanocobalamin (VITAMIN B-12 PO) Take 500 mcg by mouth daily.     Marland Kitchen dexamethasone (DECADRON) 4 MG tablet Take 1 tablet by mouth twice daily with food the day before, the day of and the day after chemotherapy 30 tablet 1  . Dextromethorphan-Guaifenesin (CORICIDIN HBP CONGESTION/COUGH PO) Take by mouth daily as needed.    Marland Kitchen emollient (BIAFINE) cream Apply topically as needed.    . fluorouracil (EFUDEX) 5 % cream Apply 1 application topically daily.   0  . folic acid (FOLVITE) 1 MG tablet Take 1 tablet (1 mg total) by mouth daily. 30 tablet 3  . lidocaine (XYLOCAINE) 2 % solution Patient: Mix 1part 2% viscous lidocaine, 1part H20. Swish and/or swallow 89mL of this mixture, 55min before meals and at bedtime, up to QID 100 mL 1  . lisinopril (PRINIVIL,ZESTRIL) 2.5 MG tablet Take 1 tablet (2.5 mg total) by mouth daily. 90 tablet 0  . Misc Natural Products (OSTEO BI-FLEX JOINT SHIELD PO) Take 1 tablet by mouth daily.      . multivitamin (THERAGRAN) per tablet Take 1 tablet by mouth daily.      . nitroGLYCERIN (NITROSTAT) 0.4 MG SL tablet Place 1 tablet (0.4 mg total) under the tongue every 5 (five) minutes as needed for chest pain. 25 tablet 5  . omeprazole (PRILOSEC) 20 MG capsule Take 20 mg by mouth daily.     . prochlorperazine (COMPAZINE) 10 MG tablet Take 1 tablet (10 mg  total) by mouth every 6 (six) hours as needed for nausea or vomiting. 30 tablet 1  . sucralfate (CARAFATE) 1 G tablet Dissolve 1 tablet in 10 mL H20 and swallow up to QID, in between meals, to soothe esophagus. 60 tablet 1  . Wound Cleansers (RADIAPLEX EX) Apply topically.    Marland Kitchen oxyCODONE-acetaminophen (PERCOCET/ROXICET) 5-325 MG per tablet Take 1 tablet by mouth every 4 (four) hours as needed for severe pain. 30 tablet 0   Current Facility-Administered Medications  Medication Dose Route Frequency Provider Last Rate Last Dose  . topical emolient (BIAFINE) emulsion   Topical Daily Tyler Pita, MD         ALLERGIES: Cephalexin and Aspirin   LABORATORY DATA:  Lab Results  Component Value Date   WBC 2.3* 03/26/2015   HGB 8.8* 03/26/2015   HCT 26.9* 03/26/2015   MCV 86.2 03/26/2015   PLT 129* 03/26/2015   Lab Results  Component Value Date   NA 136 03/26/2015   K 3.7 03/26/2015   CL 105 01/11/2015   CO2 28 03/26/2015   Lab Results  Component Value Date   ALT 20 03/26/2015   AST 24 03/26/2015   ALKPHOS 94 03/26/2015   BILITOT 1.45* 03/26/2015     NARRATIVE: Luke Contreras was seen today for weekly treatment management. The chart was  checked and the patient's films were reviewed.   Patient completed radiation treatment today. One month follow up scheduled for 5/25. Patient reports feeling very weak today. Patient being pushed in wheelchair by his wife because he is so weak. Reports increased reflux. Reports taking Mylanta for short term relief but, questions if another medication would help more and last longer. Hyperpigmentation with dry desquamation of right upper chest noted. Provided wife with additional tube of biafine. Both understand to continue to use biafine bid for the next two week. Pain with swallowing continues despite using carafate. Denies hemoptysis. Reports cough continues. Denies headache, dizziness, nausea or vomiting. Reports fatigue. Encouraged patient to sip  water and tea frequently throughout the day. 7 lb weight loss noted. BP lower than normal and tachycardic. Patient taking in one can of ensure per day. Afebrile. Reports persistent burning pain center of chest.  PHYSICAL EXAMINATION: weight is 175 lb 1.6 oz (79.425 kg). His oral temperature is 98.2 F (36.8 C). His blood pressure is 103/57 and his pulse is 121. His respiration is 16 and oxygen saturation is 100%.        ASSESSMENT: The patient did satisfactorily with treatment. He is describing significant fatigue today which has worsened. His main problem today described is that of esophagitis. I discussed this with him in detail today. He is taking multiple medications for this but no pain medicine. I gave him a starting dose of Percocet to take if needed. The patient indicated that he was somewhat hesitant to take this because of perceived stomach issues. I do not believe that this would be a problem for him in this regard but we again discussed this some. The patient will let us know if he has any worsening of symptoms. His lab work did show that he remains anemic. His bilirubin has increased some as well as his white blood cell count decreasing. Hopefully, he will begin feeling better since he finished radiation treatment today. His blood pressure was fine in clinic today. He is due for lab work early next week.  PLAN: The patient will follow-up in our clinic in 1 month.

## 2015-03-28 ENCOUNTER — Telehealth: Payer: Self-pay | Admitting: Radiation Oncology

## 2015-03-28 NOTE — Telephone Encounter (Signed)
Phoned patient at home to check in. No answer. Phoned son. He reports he spoke with his father this morning and he sounded "OK." Luke Contreras reports his Dad told him the pain and difficulty swallowing continues but, he is using his Carafate. Luke Contreras denies any needs that his father has at this time. Luke Contreras understands to contact this RN with future needs.

## 2015-03-29 ENCOUNTER — Telehealth: Payer: Self-pay | Admitting: Medical Oncology

## 2015-03-29 NOTE — Telephone Encounter (Signed)
Talked to son to get OC lip balm

## 2015-04-02 ENCOUNTER — Ambulatory Visit (HOSPITAL_COMMUNITY)
Admission: RE | Admit: 2015-04-02 | Discharge: 2015-04-02 | Disposition: A | Discharge status: Home / Self Care | Payer: Medicare Other | Source: Ambulatory Visit | Attending: Internal Medicine | Admitting: Internal Medicine

## 2015-04-02 ENCOUNTER — Telehealth: Payer: Self-pay | Admitting: *Deleted

## 2015-04-02 ENCOUNTER — Other Ambulatory Visit: Payer: Self-pay | Admitting: Nurse Practitioner

## 2015-04-02 ENCOUNTER — Other Ambulatory Visit (HOSPITAL_BASED_OUTPATIENT_CLINIC_OR_DEPARTMENT_OTHER): Payer: Medicare Other

## 2015-04-02 ENCOUNTER — Other Ambulatory Visit: Payer: Self-pay | Admitting: *Deleted

## 2015-04-02 ENCOUNTER — Encounter: Payer: Self-pay | Admitting: Nurse Practitioner

## 2015-04-02 ENCOUNTER — Other Ambulatory Visit: Payer: Self-pay | Admitting: Internal Medicine

## 2015-04-02 ENCOUNTER — Ambulatory Visit (HOSPITAL_BASED_OUTPATIENT_CLINIC_OR_DEPARTMENT_OTHER): Payer: Medicare Other | Admitting: Nurse Practitioner

## 2015-04-02 ENCOUNTER — Ambulatory Visit: Payer: Medicare Other

## 2015-04-02 VITALS — BP 105/50 | HR 95 | Temp 99.0°F | Resp 18

## 2015-04-02 VITALS — BP 119/67 | HR 94 | Temp 98.5°F | Resp 16

## 2015-04-02 DIAGNOSIS — D6481 Anemia due to antineoplastic chemotherapy: Secondary | ICD-10-CM

## 2015-04-02 DIAGNOSIS — T451X5A Adverse effect of antineoplastic and immunosuppressive drugs, initial encounter: Secondary | ICD-10-CM | POA: Insufficient documentation

## 2015-04-02 DIAGNOSIS — E876 Hypokalemia: Secondary | ICD-10-CM

## 2015-04-02 DIAGNOSIS — C3492 Malignant neoplasm of unspecified part of left bronchus or lung: Secondary | ICD-10-CM

## 2015-04-02 DIAGNOSIS — K209 Esophagitis, unspecified without bleeding: Secondary | ICD-10-CM

## 2015-04-02 DIAGNOSIS — D701 Agranulocytosis secondary to cancer chemotherapy: Secondary | ICD-10-CM

## 2015-04-02 DIAGNOSIS — L589 Radiodermatitis, unspecified: Secondary | ICD-10-CM

## 2015-04-02 DIAGNOSIS — E860 Dehydration: Secondary | ICD-10-CM | POA: Insufficient documentation

## 2015-04-02 DIAGNOSIS — E8809 Other disorders of plasma-protein metabolism, not elsewhere classified: Secondary | ICD-10-CM | POA: Insufficient documentation

## 2015-04-02 DIAGNOSIS — D6959 Other secondary thrombocytopenia: Secondary | ICD-10-CM

## 2015-04-02 LAB — COMPREHENSIVE METABOLIC PANEL (CC13)
ALT: 18 U/L (ref 0–55)
AST: 26 U/L (ref 5–34)
Albumin: 2.7 g/dL — ABNORMAL LOW (ref 3.5–5.0)
Alkaline Phosphatase: 95 U/L (ref 40–150)
Anion Gap: 13 mEq/L — ABNORMAL HIGH (ref 3–11)
BUN: 14.7 mg/dL (ref 7.0–26.0)
CO2: 25 mEq/L (ref 22–29)
Calcium: 8.5 mg/dL (ref 8.4–10.4)
Chloride: 96 mEq/L — ABNORMAL LOW (ref 98–109)
Creatinine: 0.8 mg/dL (ref 0.7–1.3)
EGFR: 84 mL/min/{1.73_m2} — ABNORMAL LOW (ref >90)
Glucose: 122 mg/dl (ref 70–140)
Potassium: 3.4 mEq/L — ABNORMAL LOW (ref 3.5–5.1)
Sodium: 134 mEq/L — ABNORMAL LOW (ref 136–145)
Total Bilirubin: 1.16 mg/dL (ref 0.20–1.20)
Total Protein: 6 g/dL — ABNORMAL LOW (ref 6.4–8.3)

## 2015-04-02 LAB — CBC WITH DIFFERENTIAL/PLATELET
BASO%: 0 % (ref 0.0–2.0)
BASOS ABS: 0 10*3/uL (ref 0.0–0.1)
EOS ABS: 0 10*3/uL (ref 0.0–0.5)
EOS%: 1.1 % (ref 0.0–7.0)
HEMATOCRIT: 21.6 % — AB (ref 38.4–49.9)
HEMOGLOBIN: 7.2 g/dL — AB (ref 13.0–17.1)
LYMPH%: 31.8 % (ref 14.0–49.0)
MCH: 28.1 pg (ref 27.2–33.4)
MCHC: 33.3 g/dL (ref 32.0–36.0)
MCV: 84.4 fL (ref 79.3–98.0)
MONO#: 0.3 10*3/uL (ref 0.1–0.9)
MONO%: 30.7 % — ABNORMAL HIGH (ref 0.0–14.0)
NEUT#: 0.3 10*3/uL — CL (ref 1.5–6.5)
NEUT%: 36.4 % — AB (ref 39.0–75.0)
Platelets: 23 10*3/uL — ABNORMAL LOW (ref 140–400)
RBC: 2.56 10*6/uL — ABNORMAL LOW (ref 4.20–5.82)
RDW: 17.7 % — AB (ref 11.0–14.6)
WBC: 0.9 10*3/uL — CL (ref 4.0–10.3)
lymph#: 0.3 10*3/uL — ABNORMAL LOW (ref 0.9–3.3)
nRBC: 0 % (ref 0–0)

## 2015-04-02 LAB — ABO/RH: ABO/RH(D): A POS

## 2015-04-02 LAB — PREPARE RBC (CROSSMATCH)

## 2015-04-02 MED ORDER — DIPHENHYDRAMINE HCL 25 MG PO CAPS
25.0000 mg | ORAL_CAPSULE | Freq: Once | ORAL | Status: AC
  Administered 2015-04-02: 25 mg via ORAL
  Filled 2015-04-02: qty 1

## 2015-04-02 MED ORDER — HEPARIN SOD (PORK) LOCK FLUSH 100 UNIT/ML IV SOLN: 500.0000 [IU] | Freq: Every day | INTRAVENOUS | Status: DC | PRN

## 2015-04-02 MED ORDER — SODIUM CHLORIDE 0.9 % IJ SOLN: 10.0000 mL | INTRAMUSCULAR | Status: DC | PRN

## 2015-04-02 MED ORDER — TBO-FILGRASTIM 300 MCG/0.5ML ~~LOC~~ SOSY: 300.0000 ug | PREFILLED_SYRINGE | Freq: Once | SUBCUTANEOUS | Status: DC

## 2015-04-02 MED ORDER — SODIUM CHLORIDE 0.9 % IV SOLN: 250.0000 mL | Freq: Once | INTRAVENOUS | Status: DC

## 2015-04-02 MED ORDER — SODIUM CHLORIDE 0.9 % IJ SOLN: 3.0000 mL | INTRAMUSCULAR | Status: DC | PRN

## 2015-04-02 MED ORDER — TBO-FILGRASTIM 300 MCG/0.5ML ~~LOC~~ SOSY
300.0000 ug | PREFILLED_SYRINGE | Freq: Once | SUBCUTANEOUS | Status: AC
Start: 2015-04-02 — End: 2015-04-02
  Administered 2015-04-02: 300 ug via SUBCUTANEOUS
  Filled 2015-04-02: qty 0.5

## 2015-04-02 MED ORDER — CIPROFLOXACIN HCL 500 MG PO TABS: 500.0000 mg | ORAL_TABLET | Freq: Two times a day (BID) | ORAL | Status: DC

## 2015-04-02 MED ORDER — HEPARIN SOD (PORK) LOCK FLUSH 100 UNIT/ML IV SOLN: 250.0000 [IU] | INTRAVENOUS | Status: DC | PRN

## 2015-04-02 MED ORDER — MAGIC MOUTHWASH W/LIDOCAINE: 5.0000 mL | Freq: Four times a day (QID) | ORAL | Status: AC | PRN

## 2015-04-02 MED ORDER — ACETAMINOPHEN 325 MG PO TABS
650.0000 mg | ORAL_TABLET | Freq: Once | ORAL | Status: AC
  Administered 2015-04-02: 650 mg via ORAL
  Filled 2015-04-02: qty 2

## 2015-04-02 NOTE — Telephone Encounter (Signed)
Called pt regarding swallowing, Pt's wife states "we have called [facility] to refill the lidocaine solution written by Dr. Isidore Moos and will pick up today." Encouraged pt to call with additional concerns. Pt and wife verbalized understanding denied further assistance

## 2015-04-02 NOTE — Progress Notes (Signed)
SYMPTOM MANAGEMENT CLINIC   HPI: Luke Contreras 81 y.o. male diagnosed with lung cancer; with brain metastasis.  Patient is status post radiation therapy to both his brain in his chest.  Currently undergoing carboplatin/Alimta chemotherapy regimen.  Patient called the cancer Center today with complaint of dysphasia/esophagitis.  He states that he has been taking the Carafate as directed; and also using the lidocaine liquid with minimal effectiveness.  Patient feels slightly dehydrated today due to the inability to take in adequate oral intake.  Patient is also complaining of increased fatigue/weakness as well.  He denies any chest pain, chest pressure, or pain with inspiration.  He is complaining of some mild shortness of breath with exertion.  He denies any recent fevers or chills.  Also, patient has 2 areas of radiation-induced dermatitis/healing burns to his right upper chest and his right posterior shoulder area.   HPI  ROS  Past Medical History  Diagnosis Date  . History of acute anterior wall myocardial infarction 11/2006  . Atrial fibrillation   . Dyslipidemia   . Hypertension   . Coronary artery disease     history of  obstructive single-valve disease  . PAC (premature atrial contraction)   . Left ventricular dysfunction     withEF of 40-45%   . Cancer     skin cancer- side of nausea  . Complication of anesthesia   . Shortness of breath dyspnea     " when talking and completing a long sentence  . GERD (gastroesophageal reflux disease)   . Arthritis     hands, knees    Past Surgical History  Procedure Laterality Date  . Cardiac catheterization  11/26/2006    Est. EF of 40% --  A 100% obstructive proximal/mid left anterior descending artery consistent with recent anterior myocardial infarction --  Moderate mid right coronary artery disease --Mild left ventricular dysfunction with EF of 40%  with distal anterior akinesis and periapical dyskinesis    -- Kaylyn Lim., M.D.  . Colonoscopy    . Video bronchoscopy with endobronchial ultrasound N/A 01/11/2015    Procedure: VIDEO BRONCHOSCOPY WITH ENDOBRONCHIAL ULTRASOUND;  Surgeon: Grace Isaac, MD;  Location: [facility] OR;  Service: Thoracic;  Laterality: N/A;  . 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    is allergic to cephalexin and aspirin.    Medication List       This list is accurate as of: 04/02/15  5:46 PM.  Always use your most recent med list.               alum & mag hydroxide-simeth 200-200-20 MG/5ML suspension  Commonly known as:  MAALOX/MYLANTA  Take by mouth every 6 (six) hours as needed for indigestion or heartburn.     atorvastatin 40 MG tablet  Commonly known as:  LIPITOR  TAKE 1/2 TABLET (= 20 MG   TOTAL) DAILY     ciprofloxacin 500 MG tablet  Commonly known as:  CIPRO  Take 1 tablet (500 mg total) by mouth 2 (two) times daily.     clorazepate 7.5 MG tablet  Commonly known as:  TRANXENE  Take 7.5 mg by mouth at bedtime.     CORICIDIN HBP CONGESTION/COUGH PO  Take by mouth daily as needed.     dexamethasone 4 MG tablet  Commonly known as:  DECADRON  Take 1 tablet by mouth twice daily with food the day before, the day of and the day after chemotherapy     emollient cream  Commonly known as:  BIAFINE  Apply  topically as needed.     fluorouracil 5 % cream  Commonly known as:  EFUDEX  Apply 1 application topically daily.     folic acid 1 MG tablet  Commonly known as:  FOLVITE  Take 1 tablet (1 mg total) by mouth daily.     lidocaine 2 % solution  Commonly known as:  XYLOCAINE  Patient: Mix 1part 2% viscous lidocaine, 1part H20. Swish and/or swallow 40mL of this mixture, 52min before meals and at bedtime, up to QID     lisinopril 2.5 MG tablet  Commonly known as:  PRINIVIL,ZESTRIL  Take 1 tablet (2.5 mg total) by mouth daily.     magic mouthwash w/lidocaine Soln  Take 5 mLs by mouth 4 (four) times daily as needed for mouth pain ([facility] mouthwash formula w/ lidocaine 1:1.).     multivitamin per tablet  Take 1 tablet by mouth daily.     nitroGLYCERIN 0.4 MG SL tablet  Commonly known as:  NITROSTAT  Place 1 tablet (0.4 mg total) under the tongue every 5 (five) minutes as needed for chest pain.     omeprazole 20 MG capsule  Commonly known as:  PRILOSEC  Take 20 mg by mouth daily.     OSTEO BI-FLEX JOINT SHIELD PO  Take 1 tablet by mouth daily.     oxyCODONE-acetaminophen 5-325 MG per tablet  Commonly known as:  PERCOCET/ROXICET  Take 1 tablet by mouth every 4 (four) hours as needed for severe pain.     prochlorperazine 10 MG tablet  Commonly known as:  COMPAZINE  Take 1 tablet (10 mg total) by mouth every 6 (six) hours as needed for nausea or vomiting.     RADIAPLEX EX  Apply topically.     sucralfate 1 G tablet  Commonly known as:  CARAFATE  Dissolve 1 tablet in 10 mL H20 and swallow up to QID, in between meals, to soothe esophagus.     VITAMIN B-12 PO  Take 500 mcg by mouth daily.         PHYSICAL EXAMINATION  Oncology Vitals 04/02/2015 04/02/2015 04/02/2015 04/02/2015 03/27/2015 03/27/2015 03/21/2015  Height - - - - - - -  Weight - - - - - 79.425 kg 82.872 kg  Weight (lbs) - - - - - 175 lbs 2 oz 182 lbs 11 oz  BMI (kg/m2) - - - - - - -  Temp 98.6 98.3 98.7 98.5 98.2  98.2 -  Pulse 84 85 92 94 121 120 82  Resp $Rem'17 18 18 16 16 16 16  'FpiA$ SpO2 100 97 97 100 100 100 100  BSA (m2) - - - - - - -   BP Readings from Last 3 Encounters:  04/02/15 119/75  04/02/15 119/67  03/27/15 103/57    Physical Exam  Constitutional: He is oriented to person, place, and time. Vital signs are normal. He appears dehydrated. He appears unhealthy.  Patient  appears fatigued, weak, and chronically ill.  HENT:  Head: Normocephalic and atraumatic.  Mouth/Throat: Oropharynx is clear and moist.  Patient has a hoarse voice.  Patient managing all oral secretions with no difficulty.  Eyes: Conjunctivae and EOM are normal. Pupils are equal, round, and reactive to light. Right eye exhibits no discharge. Left eye exhibits no discharge. No scleral icterus.  Neck: Normal range of motion. Neck supple. No JVD present. No tracheal deviation present. No thyromegaly present.  Cardiovascular: Normal rate, regular rhythm, normal heart sounds and intact distal pulses.   Pulmonary/Chest: Effort normal and breath sounds normal. No stridor. No respiratory distress. He has no wheezes. He has no rales. He exhibits no tenderness.  Abdominal: Soft. Bowel sounds are normal. He exhibits no distension and no mass. There is no tenderness. There is no rebound and no guarding.  Musculoskeletal: Normal range of motion. He exhibits no edema or tenderness.  Lymphadenopathy:    He has no cervical adenopathy.  Neurological: He is alert and oriented to person, place, and time.  Skin: Skin is warm and dry. No rash noted. No erythema. There is pallor.  Psychiatric: Affect normal.  Nursing note and vitals reviewed.   LABORATORY DATA:. Hospital Outpatient Visit on 04/02/2015  Component Date Value Ref Range Status  . Order Confirmation 04/02/2015 ORDER PROCESSED BY BLOOD BANK   Final  . ABO/RH(D) 04/02/2015 A POS   Final  . Antibody Screen 04/02/2015 NEG   Final  . Sample Expiration 04/02/2015 04/05/2015   Final  .  Unit Number 04/02/2015 [id number]   Final  . Blood Component Type 04/02/2015 RED CELLS,LR   Final  . Unit division 04/02/2015 00   Final  . Status of Unit 04/02/2015 ISSUED   Final  . Transfusion Status 04/02/2015 OK TO TRANSFUSE   Final  . Crossmatch Result 04/02/2015 Compatible   Final  . Unit Number 04/02/2015 [id number]   Final  . Blood Component Type 04/02/2015 RED CELLS,LR   Final  . Unit division 04/02/2015 00   Final  . Status of Unit 04/02/2015 ISSUED   Final  . Transfusion Status 04/02/2015 OK TO TRANSFUSE   Final  . Crossmatch Result 04/02/2015 Compatible   Final  . ABO/RH(D) 04/02/2015 A POS   Final  Appointment on 04/02/2015  Component Date Value Ref Range Status  . WBC 04/02/2015 0.9* 4.0 - 10.3 10e3/uL Final  . NEUT# 04/02/2015 0.3* 1.5 - 6.5 10e3/uL Final  . HGB 04/02/2015 7.2* 13.0 - 17.1 g/dL Final  . HCT 04/02/2015 21.6* 38.4 - 49.9 % Final  . Platelets 04/02/2015 23* 140 - 400 10e3/uL Final  . MCV 04/02/2015 84.4  79.3 - 98.0 fL Final  . MCH 04/02/2015 28.1  27.2 - 33.4 pg Final  . MCHC 04/02/2015 33.3  32.0 - 36.0 g/dL Final  . RBC 04/02/2015 2.56* 4.20 - 5.82 10e6/uL Final  . RDW 04/02/2015 17.7* 11.0 - 14.6 % Final  . lymph# 04/02/2015 0.3* 0.9 - 3.3 10e3/uL Final  . MONO# 04/02/2015 0.3  0.1 - 0.9 10e3/uL Final  . Eosinophils Absolute 04/02/2015 0.0  0.0 - 0.5 10e3/uL Final  . Basophils Absolute 04/02/2015 0.0  0.0 - 0.1 10e3/uL Final  . NEUT% 04/02/2015 36.4* 39.0 - 75.0 % Final  . LYMPH% 04/02/2015 31.8  14.0 - 49.0 % Final  . MONO% 04/02/2015 30.7* 0.0 - 14.0 % Final  . EOS% 04/02/2015 1.1  0.0 - 7.0 % Final  . BASO% 04/02/2015 0.0  0.0 - 2.0 % Final  .  nRBC 04/02/2015 0  0 - 0 % Final  . Sodium 04/02/2015 134* 136 - 145 mEq/L Final  . Potassium 04/02/2015 3.4* 3.5 - 5.1 mEq/L Final  . Chloride 04/02/2015 96* 98 - 109 mEq/L Final  . CO2 04/02/2015 25  22 - 29 mEq/L Final  . Glucose 04/02/2015 122  70 - 140 mg/dl Final  . BUN 04/02/2015 14.7   7.0 - 26.0 mg/dL Final  . Creatinine 04/02/2015 0.8  0.7 - 1.3 mg/dL Final  . Total Bilirubin 04/02/2015 1.16  0.20 - 1.20 mg/dL Final  . Alkaline Phosphatase 04/02/2015 95  40 - 150 U/L Final  . AST 04/02/2015 26  5 - 34 U/L Final  . ALT 04/02/2015 18  0 - 55 U/L Final  . Total Protein 04/02/2015 6.0* 6.4 - 8.3 g/dL Final  . Albumin 04/02/2015 2.7* 3.5 - 5.0 g/dL Final  . Calcium 04/02/2015 8.5  8.4 - 10.4 mg/dL Final  . Anion Gap 04/02/2015 13* 3 - 11 mEq/L Final  . EGFR 04/02/2015 84* >90 ml/min/1.73 m2 Final   eGFR is calculated using the CKD-EPI Creatinine Equation (2009)     RADIOGRAPHIC STUDIES: No results found.  ASSESSMENT/PLAN:    Non-small cell cancer of left lung Patient is status post stereotactic radiation therapy to his brain.  Patient received cycle 3 of his carboplatin/Alimta chemotherapy on 03/19/2015.  Patient completed his chest radiation treatments on 03/27/2015.  Patient is scheduled for restaging CT on 04/05/2015.  He is scheduled for labs and a follow-up visit on 04/08/2015.  He is scheduled for his next cycle of chemotherapy on 04/09/2015.   Chemotherapy induced neutropenia Chemotherapy therapy-induced neutropenia today; with WBC 0.9 and ANC 0.3.  Patient is afebrile on exam today.  Patient will be given Granix 480 g subcutaneously today and for the next 2 days as well.  Patient will also be prescribed Cipro antibiotics prophylactically.   Esophagitis Patient is complaining of throat pain and difficulty swallowing specific liquids and foods.  Patient already takes Carafate as directed.  He also has lidocaine liquid as well.    On exam-patient with no lesions to his oral mucosa.  Posterior  oropharynx clear; with no erythema or exudate.  Patient does have a healing ulcer to his lower lip.  Patient appears to be managing all secretions with no difficulty.  Advised both patient and his wife that patient's esophagitis symptoms will slowly begin to resolve since  patient has completed radiation.  Patient was prescribed Magic mouthwash with lidocaine today as well.   Radiation dermatitis Patient has 2 different areas of apparent radiation dermatitis; with desquamation.  Both areas are approximately 4 cm in diameter and tender to palpation.  No evidence of active infection to either.  New Vaseline gauze dressings were applied to both areas.  Both patient and his wife state that they have the barrier cream at home to use as well.   Antineoplastic chemotherapy induced anemia Chemotherapy-induced anemia with hemoglobin down to 7.2.  Patient is complaining of increasing fatigue and some mild shortness of breath with exertion.  Patient will receive 2 units packed red blood cells in the sickle cell unit clinic today.   Chemotherapy induced thrombocytopenia Chemotherapy-induced thrombocytopenia with platelet count down to 23.  Patient denies any worsening issues with either easy bleeding or bruising.   Hypokalemia Potassium down to 3.4 today.  Most likely, this is secondary to patient's mild dehydration.  Patient was encouraged to push potassium in his diet is much as possible.  Will continue to monitor closely.   Hypoalbuminemia Of edema down to 2.7 today.  Patient was encouraged to push protein in the form of either protein shakes or protein bars at all possible.   Dehydration Patient does appear mildly dehydrated today.  Most likely, this is secondary to patient's esophagitis.  Patient was encouraged to push fluids is much as possible.  Also, patient will receive 2 units packed red blood cells transfusion today.    Patient stated understanding of all instructions; and was in agreement with this plan of care. The patient knows to call the clinic with any problems, questions or concerns.   This was a shared visit with Dr. Julien Nordmann today.  Total time spent with patient was 40 minutes;  with greater than 75 percent of that time spent in face to face  counseling regarding patient's symptoms,  and coordination of care and follow up.  Disclaimer: This note was dictated with voice recognition software. Similar sounding words can inadvertently be transcribed and may not be corrected upon review.   Drue Second, NP 04/02/2015   ADDENDUM: Hematology/Oncology Attending: I had a face to face encounter with the patient. I recommended his care plan. This is a very pleasant 81 years old white male with metastatic non-small cell lung cancer, adenocarcinoma currently undergoing systemic chemotherapy was carboplatin and Alimta status post 3 cycles. The patient also received palliative radiotherapy to the chest. He presented today complaining of sore throat as well as dysphagia and questionable radiation induced esophagitis. He was advised to continue Carafate and viscous lidocaine. The patient was also found to have chemotherapy-induced neutropenia with no fever. We will arrange for the patient to start Granix 300 g subcutaneously daily for 3 days. He will also be started empirically on Cipro 500 mg by mouth twice a day for 5 days. For the chemotherapy-induced anemia, we will arrange for the patient to receive 2 units of PRBCs transfusion. He would come back for follow-up visit as previously scheduled with repeat CT scan of the chest, abdomen and pelvis for restaging of his disease.  Disclaimer: This note was dictated with voice recognition software. Similar sounding words can inadvertently be transcribed and may be missed upon review. Eilleen Kempf., MD 04/03/2015

## 2015-04-02 NOTE — Assessment & Plan Note (Signed)
Patient has 2 different areas of apparent radiation dermatitis; with desquamation.  Both areas are approximately 4 cm in diameter and tender to palpation.  No evidence of active infection to either.  New Vaseline gauze dressings were applied to both areas.  Both patient and his wife state that they have the barrier cream at home to use as well.

## 2015-04-02 NOTE — Assessment & Plan Note (Signed)
Chemotherapy-induced thrombocytopenia with platelet count down to 23.  Patient denies any worsening issues with either easy bleeding or bruising.

## 2015-04-02 NOTE — Assessment & Plan Note (Signed)
Chemotherapy therapy-induced neutropenia today; with WBC 0.9 and ANC 0.3.  Patient is afebrile on exam today.  Patient will be given Granix 480 g subcutaneously today and for the next 2 days as well.  Patient will also be prescribed Cipro antibiotics prophylactically.

## 2015-04-02 NOTE — Telephone Encounter (Signed)
TC from son, Kasandra Knudsen. Kasandra Knudsen states Mr. Gowdy is having some increased pain with swallowing. He does drink and eat fairly well but with pain. Son asking if Dr. Julien Nordmann would order 'something like magic mouthwash'.

## 2015-04-02 NOTE — Progress Notes (Signed)
  Radiation Oncology         [phone] ________________________________  Name: Luke Contreras MRN: [id number]  Date: 03/27/2015  DOB: 1930/09/27  End of Treatment Note  Diagnosis:   80 yo man with a solitary 9 mm brain metastasis from poorly differentiated adenocarcinoma of the left upper lung - Stage IV  Indication for treatment:  Palliative     Radiation treatment dates:   02/11/2015-03/27/2015  Site/dose:   The patient's primary tumor and involved lymph nodes were treated to 66 Gy in 33 fractions  Beams/energy:   A five field beam arrangement was used with gantry angles zero, 180, 340, 160, and 90.  6 MV X-rays were used.  Image guidance was performed with daily cone beam CT  Narrative: The patient tolerated radiation treatment relatively well.   He had some discomfort associated with swallowing managed well with Carafate. Hoarseness continued. Reported cough less frequent with Delsym. Denied hemoptysis. He had some hyperpigmentation with dry desquamation of right upper chest noted. Used Biafine to affected area.   Plan: The patient has completed radiation treatment. The patient will return to radiation oncology clinic for routine followup in one month. I advised him to call or return sooner if he has any questions or concerns related to his recovery or treatment. ________________________________  Sheral Apley. Tammi Klippel, M.D.

## 2015-04-02 NOTE — Assessment & Plan Note (Signed)
Potassium down to 3.4 today.  Most likely, this is secondary to patient's mild dehydration.  Patient was encouraged to push potassium in his diet is much as possible.  Will continue to monitor closely.

## 2015-04-02 NOTE — Assessment & Plan Note (Signed)
Patient is status post stereotactic radiation therapy to his brain.  Patient received cycle 3 of his carboplatin/Alimta chemotherapy on 03/19/2015.  Patient completed his chest radiation treatments on 03/27/2015.  Patient is scheduled for restaging CT on 04/05/2015.  He is scheduled for labs and a follow-up visit on 04/08/2015.  He is scheduled for his next cycle of chemotherapy on 04/09/2015.

## 2015-04-02 NOTE — Progress Notes (Signed)
Spoke with Drue Second, NP.  Explained that I would try to get both units transfused today, however, we are awaiting IV and blood bank to process specimen.  Explained I feel that we would be able to do one unit today.  Caren Griffins verified that if need be, the patient could come back to [facility] tomorrow for second unit.

## 2015-04-02 NOTE — Assessment & Plan Note (Signed)
Patient is complaining of throat pain and difficulty swallowing specific liquids and foods.  Patient already takes Carafate as directed.  He also has lidocaine liquid as well.    On exam-patient with no lesions to his oral mucosa.  Posterior  oropharynx clear; with no erythema or exudate.  Patient does have a healing ulcer to his lower lip.  Patient appears to be managing all secretions with no difficulty.  Advised both patient and his wife that patient's esophagitis symptoms will slowly begin to resolve since patient has completed radiation.  Patient was prescribed Magic mouthwash with lidocaine today as well.

## 2015-04-02 NOTE — Procedures (Signed)
Diagnosis: D64.81   Procedure: Transfused 2 units of Blood Condition during procedure: Patient tolerated well. No complaints.  Condition at discharge: Patient stable, ambulatory and transported via family to vehicle. No complaints.

## 2015-04-02 NOTE — Assessment & Plan Note (Signed)
Patient does appear mildly dehydrated today.  Most likely, this is secondary to patient's esophagitis.  Patient was encouraged to push fluids is much as possible.  Also, patient will receive 2 units packed red blood cells transfusion today.

## 2015-04-02 NOTE — Assessment & Plan Note (Signed)
Of edema down to 2.7 today.  Patient was encouraged to push protein in the form of either protein shakes or protein bars at all possible.

## 2015-04-02 NOTE — Assessment & Plan Note (Signed)
Chemotherapy-induced anemia with hemoglobin down to 7.2.  Patient is complaining of increasing fatigue and some mild shortness of breath with exertion.  Patient will receive 2 units packed red blood cells in the sickle cell unit clinic today.

## 2015-04-03 ENCOUNTER — Ambulatory Visit (HOSPITAL_BASED_OUTPATIENT_CLINIC_OR_DEPARTMENT_OTHER): Payer: Medicare Other

## 2015-04-03 VITALS — BP 129/58 | HR 94 | Temp 98.1°F

## 2015-04-03 DIAGNOSIS — D701 Agranulocytosis secondary to cancer chemotherapy: Secondary | ICD-10-CM

## 2015-04-03 DIAGNOSIS — T451X5A Adverse effect of antineoplastic and immunosuppressive drugs, initial encounter: Principal | ICD-10-CM

## 2015-04-03 LAB — TYPE AND SCREEN
ABO/RH(D): A POS
ANTIBODY SCREEN: NEGATIVE
UNIT DIVISION: 0
Unit division: 0

## 2015-04-03 MED ORDER — TBO-FILGRASTIM 300 MCG/0.5ML ~~LOC~~ SOSY
300.0000 ug | PREFILLED_SYRINGE | Freq: Once | SUBCUTANEOUS | Status: AC
  Administered 2015-04-03: 300 ug via SUBCUTANEOUS
  Filled 2015-04-03: qty 0.5

## 2015-04-03 NOTE — Telephone Encounter (Signed)
Spoke to Luke Contreras- confirmed appts for injections today and tomorrow. Pt is feeling "100% better today than yesterday".

## 2015-04-04 ENCOUNTER — Ambulatory Visit (HOSPITAL_BASED_OUTPATIENT_CLINIC_OR_DEPARTMENT_OTHER): Payer: Medicare Other

## 2015-04-04 VITALS — BP 115/58 | HR 105 | Temp 98.6°F

## 2015-04-04 DIAGNOSIS — C3412 Malignant neoplasm of upper lobe, left bronchus or lung: Secondary | ICD-10-CM | POA: Diagnosis present

## 2015-04-04 DIAGNOSIS — Z5189 Encounter for other specified aftercare: Secondary | ICD-10-CM | POA: Diagnosis not present

## 2015-04-04 DIAGNOSIS — D701 Agranulocytosis secondary to cancer chemotherapy: Secondary | ICD-10-CM

## 2015-04-04 DIAGNOSIS — T451X5A Adverse effect of antineoplastic and immunosuppressive drugs, initial encounter: Principal | ICD-10-CM

## 2015-04-04 MED ORDER — TBO-FILGRASTIM 300 MCG/0.5ML ~~LOC~~ SOSY
300.0000 ug | PREFILLED_SYRINGE | Freq: Once | SUBCUTANEOUS | Status: AC
  Administered 2015-04-04: 300 ug via SUBCUTANEOUS
  Filled 2015-04-04: qty 0.5

## 2015-04-05 ENCOUNTER — Ambulatory Visit (HOSPITAL_COMMUNITY)
Admission: RE | Admit: 2015-04-05 | Discharge: 2015-04-05 | Disposition: A | Discharge status: Home / Self Care | Payer: Medicare Other | Source: Ambulatory Visit | Attending: Physician Assistant | Admitting: Physician Assistant

## 2015-04-05 ENCOUNTER — Encounter (HOSPITAL_COMMUNITY): Payer: Self-pay

## 2015-04-05 DIAGNOSIS — Z923 Personal history of irradiation: Secondary | ICD-10-CM | POA: Insufficient documentation

## 2015-04-05 DIAGNOSIS — R05 Cough: Secondary | ICD-10-CM | POA: Insufficient documentation

## 2015-04-05 DIAGNOSIS — C3492 Malignant neoplasm of unspecified part of left bronchus or lung: Secondary | ICD-10-CM

## 2015-04-05 DIAGNOSIS — Z9221 Personal history of antineoplastic chemotherapy: Secondary | ICD-10-CM | POA: Insufficient documentation

## 2015-04-05 MED ORDER — IOHEXOL 300 MG/ML  SOLN
80.0000 mL | Freq: Once | INTRAMUSCULAR | Status: AC | PRN
  Administered 2015-04-05: 80 mL via INTRAVENOUS

## 2015-04-08 ENCOUNTER — Ambulatory Visit (HOSPITAL_BASED_OUTPATIENT_CLINIC_OR_DEPARTMENT_OTHER): Payer: Medicare Other | Admitting: Physician Assistant

## 2015-04-08 ENCOUNTER — Other Ambulatory Visit (HOSPITAL_BASED_OUTPATIENT_CLINIC_OR_DEPARTMENT_OTHER): Payer: Medicare Other

## 2015-04-08 ENCOUNTER — Telehealth: Payer: Self-pay | Admitting: Physician Assistant

## 2015-04-08 ENCOUNTER — Ambulatory Visit (HOSPITAL_BASED_OUTPATIENT_CLINIC_OR_DEPARTMENT_OTHER): Payer: Medicare Other

## 2015-04-08 ENCOUNTER — Encounter: Payer: Self-pay | Admitting: Physician Assistant

## 2015-04-08 VITALS — BP 122/60 | HR 109 | Temp 98.5°F | Resp 18 | Ht 69.0 in | Wt 175.2 lb

## 2015-04-08 DIAGNOSIS — Z5111 Encounter for antineoplastic chemotherapy: Secondary | ICD-10-CM

## 2015-04-08 DIAGNOSIS — C3492 Malignant neoplasm of unspecified part of left bronchus or lung: Secondary | ICD-10-CM

## 2015-04-08 DIAGNOSIS — R350 Frequency of micturition: Secondary | ICD-10-CM

## 2015-04-08 DIAGNOSIS — C3412 Malignant neoplasm of upper lobe, left bronchus or lung: Secondary | ICD-10-CM

## 2015-04-08 DIAGNOSIS — C7931 Secondary malignant neoplasm of brain: Secondary | ICD-10-CM

## 2015-04-08 LAB — URINALYSIS, MICROSCOPIC - CHCC
BLOOD: NEGATIVE
Bilirubin (Urine): NEGATIVE
GLUCOSE UR CHCC: NEGATIVE mg/dL
KETONES: NEGATIVE mg/dL
Leukocyte Esterase: NEGATIVE
Nitrite: NEGATIVE
PH: 6 (ref 4.6–8.0)
Protein: 30 mg/dL
RBC / HPF: NEGATIVE (ref 0–2)
Specific Gravity, Urine: 1.02 (ref 1.003–1.035)
UROBILINOGEN UR: 0.2 mg/dL (ref 0.2–1)

## 2015-04-08 LAB — COMPREHENSIVE METABOLIC PANEL (CC13)
ALT: 16 U/L (ref 0–55)
AST: 26 U/L (ref 5–34)
Albumin: 2.7 g/dL — ABNORMAL LOW (ref 3.5–5.0)
Alkaline Phosphatase: 111 U/L (ref 40–150)
Anion Gap: 13 mEq/L — ABNORMAL HIGH (ref 3–11)
BUN: 8.6 mg/dL (ref 7.0–26.0)
CO2: 26 meq/L (ref 22–29)
Calcium: 8.4 mg/dL (ref 8.4–10.4)
Chloride: 99 mEq/L (ref 98–109)
Creatinine: 0.9 mg/dL (ref 0.7–1.3)
EGFR: 79 mL/min/{1.73_m2} — ABNORMAL LOW (ref >90)
Glucose: 126 mg/dl (ref 70–140)
POTASSIUM: 3.7 meq/L (ref 3.5–5.1)
Sodium: 137 mEq/L (ref 136–145)
Total Bilirubin: 0.51 mg/dL (ref 0.20–1.20)
Total Protein: 6 g/dL — ABNORMAL LOW (ref 6.4–8.3)

## 2015-04-08 LAB — CBC WITH DIFFERENTIAL/PLATELET
BASO%: 0.2 % (ref 0.0–2.0)
Basophils Absolute: 0 10*3/uL (ref 0.0–0.1)
EOS ABS: 0 10*3/uL (ref 0.0–0.5)
EOS%: 0 % (ref 0.0–7.0)
HCT: 30.5 % — ABNORMAL LOW (ref 38.4–49.9)
HGB: 9.8 g/dL — ABNORMAL LOW (ref 13.0–17.1)
LYMPH%: 10.3 % — ABNORMAL LOW (ref 14.0–49.0)
MCH: 28.3 pg (ref 27.2–33.4)
MCHC: 32.1 g/dL (ref 32.0–36.0)
MCV: 88.2 fL (ref 79.3–98.0)
MONO#: 0.4 10*3/uL (ref 0.1–0.9)
MONO%: 8.7 % (ref 0.0–14.0)
NEUT#: 4 10*3/uL (ref 1.5–6.5)
NEUT%: 80.8 % — AB (ref 39.0–75.0)
Platelets: 100 10*3/uL — ABNORMAL LOW (ref 140–400)
RBC: 3.46 10*6/uL — ABNORMAL LOW (ref 4.20–5.82)
RDW: 19.6 % — ABNORMAL HIGH (ref 11.0–14.6)
WBC: 5 10*3/uL (ref 4.0–10.3)
lymph#: 0.5 10*3/uL — ABNORMAL LOW (ref 0.9–3.3)

## 2015-04-08 LAB — HOLD TUBE, BLOOD BANK

## 2015-04-08 MED ORDER — SODIUM CHLORIDE 0.9 % IV SOLN
454.5000 mg | Freq: Once | INTRAVENOUS | Status: AC
  Administered 2015-04-08: 450 mg via INTRAVENOUS
  Filled 2015-04-08: qty 45

## 2015-04-08 MED ORDER — SODIUM CHLORIDE 0.9 % IV SOLN
400.0000 mg/m2 | Freq: Once | INTRAVENOUS | Status: AC
  Administered 2015-04-08: 800 mg via INTRAVENOUS
  Filled 2015-04-08: qty 32

## 2015-04-08 MED ORDER — SODIUM CHLORIDE 0.9 % IV SOLN
Freq: Once | INTRAVENOUS | Status: AC
  Administered 2015-04-08: 14:00:00 via INTRAVENOUS

## 2015-04-08 MED ORDER — SODIUM CHLORIDE 0.9 % IV SOLN
Freq: Once | INTRAVENOUS | Status: AC
  Administered 2015-04-08: 14:00:00 via INTRAVENOUS
  Filled 2015-04-08: qty 8

## 2015-04-08 NOTE — Telephone Encounter (Signed)
Chemo appointments added per staff message

## 2015-04-08 NOTE — Progress Notes (Addendum)
[name] [phone]   Fax:([phone]) [place] [place] [name], [name] [address]  DIAGNOSIS: Metastatic non-small cell lung cancer, poorly differentiated adenocarcinoma diagnosed in January 2016.   PRIOR THERAPY:  1. Status post SRS treatment to single brain lesion under the care of Dr. Tammi Klippel.  2. Palliative radiation therapy under the care of the [facility]. Last fraction of radiation therapy expected 03/27/2015.  CURRENT THERAPY:  Systemic chemotherapy with carboplatin for an AUC of 5 and Alimta at 500 mg/m given every 3 weeks. Status post 3 cycles.    INTERVAL HISTORY: Luke Contreras 81 y.o. male returns to the clinic today for follow-up visit accompanied by his wife. The patient is feeling fine today with no specific complaints except for the baseline shortness of breath in addition to hoarseness of his voice. He is tolerating his chemotherapy relatively well but is having significant fatigue associated with radiation therapy. He completed his radiation therapy on 03/27/2015.  He did recently require packed red blood cell transfusion for chemotherapy-induced anemia and feels significantly better after that procedure. He reports some constipation as well as some urinary frequency with decreased force of stream. He denied having any significant weight loss or night sweats. He has no chest pain but continues to have shortness breath with exertion with mild cough with no hemoptysis. The patient denied having any current significant nausea or vomiting. He recently had a restaging CT scan of his chest and presents to discuss the results. He is also here to proceed with cycle #4 of his systemic chemotherapy with carboplatin and Alimta.  MEDICAL HISTORY: Past Medical History  Diagnosis Date  . History of acute anterior wall myocardial infarction 11/2006  . Atrial fibrillation   . Dyslipidemia   .  Hypertension   . Coronary artery disease     history of  obstructive single-valve disease  . PAC (premature atrial contraction)   . Left ventricular dysfunction     withEF of 40-45%   . Complication of anesthesia   . Shortness of breath dyspnea     " when talking and completing a long sentence  . GERD (gastroesophageal reflux disease)   . Arthritis     hands, knees  . Cancer     skin cancer- side of nausea    ALLERGIES:  is allergic to cephalexin and aspirin.  MEDICATIONS:  Current Outpatient Prescriptions  Medication Sig Dispense Refill  . alum & mag hydroxide-simeth (MAALOX/MYLANTA) 200-200-20 MG/5ML suspension Take by mouth every 6 (six) hours as needed for indigestion or heartburn.    . Alum & Mag Hydroxide-Simeth (MAGIC MOUTHWASH W/LIDOCAINE) SOLN Take 5 mLs by mouth 4 (four) times daily as needed for mouth pain ([facility] mouthwash formula w/ lidocaine 1:1.). 240 mL 1  . atorvastatin (LIPITOR) 40 MG tablet TAKE 1/2 TABLET (= 20 MG   TOTAL) DAILY 45 tablet 2  . ciprofloxacin (CIPRO) 500 MG tablet Take 1 tablet (500 mg total) by mouth 2 (two) times daily. 10 tablet 0  . clorazepate (TRANXENE) 7.5 MG tablet Take 7.5 mg by mouth at bedtime.      . Cyanocobalamin (VITAMIN B-12 PO) Take 500 mcg by mouth daily.     Marland Kitchen dexamethasone (DECADRON) 4 MG tablet Take 1 tablet by mouth twice daily with food the day before, the day of and the day after chemotherapy 30 tablet 1  . Dextromethorphan-Guaifenesin (CORICIDIN HBP CONGESTION/COUGH PO) Take by mouth  daily as needed.    Marland Kitchen emollient (BIAFINE) cream Apply topically as needed.    . fluorouracil (EFUDEX) 5 % cream Apply 1 application topically daily.   0  . folic acid (FOLVITE) 1 MG tablet Take 1 tablet (1 mg total) by mouth daily. 30 tablet 3  . lidocaine (XYLOCAINE) 2 % solution Patient: Mix 1part 2% viscous lidocaine, 1part H20. Swish and/or swallow 93m of this mixture, 351m before meals and at bedtime, up to QID 100 mL 1  . lisinopril  (PRINIVIL,ZESTRIL) 2.5 MG tablet Take 1 tablet (2.5 mg total) by mouth daily. 90 tablet 0  . Misc Natural Products (OSTEO BI-FLEX JOINT SHIELD PO) Take 1 tablet by mouth daily.      . multivitamin (THERAGRAN) per tablet Take 1 tablet by mouth daily.      . nitroGLYCERIN (NITROSTAT) 0.4 MG SL tablet Place 1 tablet (0.4 mg total) under the tongue every 5 (five) minutes as needed for chest pain. 25 tablet 5  . omeprazole (PRILOSEC) 20 MG capsule Take 20 mg by mouth daily.     . Marland KitchenxyCODONE-acetaminophen (PERCOCET/ROXICET) 5-325 MG per tablet Take 1 tablet by mouth every 4 (four) hours as needed for severe pain. 30 tablet 0  . prochlorperazine (COMPAZINE) 10 MG tablet Take 1 tablet (10 mg total) by mouth every 6 (six) hours as needed for nausea or vomiting. 30 tablet 1  . sucralfate (CARAFATE) 1 G tablet Dissolve 1 tablet in 10 mL H20 and swallow up to QID, in between meals, to soothe esophagus. 60 tablet 1  . Wound Cleansers (RADIAPLEX EX) Apply topically.     No current facility-administered medications for this visit.    SURGICAL HISTORY:  Past Surgical History  Procedure Laterality Date  . Cardiac catheterization  11/26/2006    Est. EF of 40% --  A 100% obstructive proximal/mid left anterior descending artery consistent with recent anterior myocardial infarction --  Moderate mid right coronary artery disease --Mild left ventricular dysfunction with EF of 40%  with distal anterior akinesis and periapical dyskinesis    -- TeKaylyn Lim M.D.  . Colonoscopy    . Video bronchoscopy with endobronchial ultrasound N/A 01/11/2015    Procedure: VIDEO BRONCHOSCOPY WITH ENDOBRONCHIAL ULTRASOUND;  Surgeon: EdGrace IsaacMD;  [facility]: [facility];  Laterality: N/A;  . Lung biopsy N/A 01/11/2015    Procedure: LUNG BIOPSY;  Surgeon: EdGrace IsaacMD;  [facility] Service: Thoracic;  Laterality: N/A;    REVIEW OF SYSTEMS:  Constitutional: negative Eyes: negative Ears,  nose, mouth, throat, and face: positive for hoarseness Respiratory: positive for dyspnea on exertion Cardiovascular: negative Gastrointestinal: positive for constipation Genitourinary:positive for decreased stream and frequency Integument/breast: negative Hematologic/lymphatic: negative Musculoskeletal:negative Neurological: negative Behavioral/Psych: negative Endocrine: negative Allergic/Immunologic: negative   PHYSICAL EXAMINATION: General appearance: alert, cooperative, fatigued and no distress Head: Normocephalic, without obvious abnormality, atraumatic Neck: no adenopathy, no JVD, supple, symmetrical, trachea midline and thyroid not enlarged, symmetric, no tenderness/mass/nodules Lymph nodes: Cervical, supraclavicular, and axillary nodes normal. Resp: clear to auscultation bilaterally Back: symmetric, no curvature. ROM normal. No CVA tenderness. Cardio: regular rate and rhythm, S1, S2 normal, no murmur, click, rub or gallop GI: soft, non-tender; bowel sounds normal; no masses,  no organomegaly Extremities: extremities normal, atraumatic, no cyanosis or edema Neurologic: Alert and oriented X 3, normal strength and tone. Normal symmetric reflexes. Normal coordination and gait  ECOG PERFORMANCE STATUS: 1 - Symptomatic but completely ambulatory  Blood pressure 122/60,  pulse 109, temperature 98.5 F (36.9 C), temperature source Oral, resp. rate 18, height '5\' 9"'$  (1.753 m), weight 175 lb 3.2 oz (79.47 kg), SpO2 97 %.  LABORATORY DATA: Lab Results  Component Value Date   WBC 5.0 04/08/2015   HGB 9.8* 04/08/2015   HCT 30.5* 04/08/2015   MCV 88.2 04/08/2015   PLT 100* 04/08/2015      Chemistry      Component Value Date/Time   NA 137 04/08/2015 1111   NA 142 01/11/2015 0726   K 3.7 04/08/2015 1111   K 3.6 01/11/2015 0726   CL 105 01/11/2015 0726   CO2 26 04/08/2015 1111   CO2 32 01/11/2015 0726   BUN 8.6 04/08/2015 1111   BUN 10 01/11/2015 0726   CREATININE 0.9 04/08/2015  1111   CREATININE 0.93 01/11/2015 0726   CREATININE 0.88 12/29/2014 1207      Component Value Date/Time   CALCIUM 8.4 04/08/2015 1111   CALCIUM 8.9 01/11/2015 0726   ALKPHOS 111 04/08/2015 1111   ALKPHOS 106 01/11/2015 0726   AST 26 04/08/2015 1111   AST 26 01/11/2015 0726   ALT 16 04/08/2015 1111   ALT 14 01/11/2015 0726   BILITOT 0.51 04/08/2015 1111   BILITOT 1.1 01/11/2015 0726       RADIOGRAPHIC STUDIES: Ct Chest W Contrast  04/05/2015   CLINICAL DATA:  81 year old male recently diagnosed with left upper lobe lung cancer status post chemotherapy and radiation therapy completed in April 2016. Productive cough.  EXAM: CT CHEST WITH CONTRAST  TECHNIQUE: Multidetector CT imaging of the chest was performed during intravenous contrast administration.  CONTRAST:  28m OMNIPAQUE IOHEXOL 300 MG/ML  SOLN  COMPARISON:  PET-CT 01/18/2015.  FINDINGS: Mediastinum/Lymph Nodes: Abnormal soft tissue in the left suprahilar region and AP window region, contiguous with the left upper lobe mass appears smaller than the prior examination. Presumably some of this is residual nodal disease. This is amorphous in appearance and difficult to separate from the adjacent mass, so therefore difficult to discretely measure. No other mediastinal or right hilar lymphadenopathy is noted. Heart size is normal. There is no significant pericardial fluid, thickening or pericardial calcification. There is atherosclerosis of the thoracic aorta, the great vessels of the mediastinum and the coronary arteries, including calcified atherosclerotic plaque in the left anterior descending and right coronary arteries. Esophagus is unremarkable in appearance. No axillary lymphadenopathy.  Lungs/Pleura: Previously noted left upper lobe mass has significantly decreased in size, and remains contiguous with adjacent left suprahilar and AP window lymphadenopathy. The bulkiest portion of this mass and adjacent adenopathy measures up to 4.6 x 3.9  cm (image 22 of series 2). There is a large amount of adjacent atelectatic lung parenchyma extending anteriorly and upwards into the left apex, similar prior studies, such that the entire left upper lobe is either involve with this mass or is chronically collapsed at this time. Compensatory hyperexpansion of the left lower lobe is noted. 6 mm subpleural nodule in the posterior aspect of the right upper lobe (image 16 of series 5) is unchanged. There is also a 6 mm subpleural nodule in the periphery of the right upper lobe on image 27 of series 5, which is new compared to the prior examination, but nonspecific. Mild diffuse bronchial wall thickening with mild to moderate centrilobular emphysema. Trace amount of dependent pleural fluid and/or thickening in the left hemithorax. No right pleural effusion.  Upper Abdomen: Tiny calcified gallstones in the fundus of the gallbladder. Gallbladder is nearly  completely decompressed. Gallbladder wall appears diffusely thickened and likely edematous measuring up to 8 mm in thickness. No pericholecystic fluid or overt surrounding inflammatory changes. Mild scarring in the lateral aspect of the left kidney. Exophytic 3 cm simple cyst in the upper pole of the left kidney. Atherosclerosis. Unchanged 1.2 cm hypovascular lesion in the spleen, nonspecific, but favored to be benign.  Musculoskeletal/Soft Tissues: Lytic lesion in the lateral aspect of the right first rib again noted. No other definite aggressive appearing lytic or blastic lesions are noted in the visualized portions of the skeleton.  IMPRESSION: 1. Today's study demonstrates a positive response to therapy with decreased size of left upper lobe mass and associated left sided mediastinal/hilar lymphadenopathy, as discussed above. There is persistent complete atelectasis of the remaining portions of the left upper lobe. 2. Tiny 6 mm subpleural nodule in the posterior aspect of the right upper lobe (which was previously  hypermetabolic) is essentially unchanged. There is a new 6 mm subpleural nodule in the adjacent right upper lobe as well. 3. Lytic lesion in the lateral aspect of the right first rib again noted. No new osseous lesions are noted. 4. Cholelithiasis. Gallbladder is contracted, with a thickened edematous wall. However, there are no overt imaging findings to suggest acute cholecystitis at this time. The possibility of chronic cholecystitis is not excluded, and clinical correlation is suggested. 5.  Atherosclerosis, including 2 vessel coronary artery disease.   Electronically Signed   By: Vinnie Langton M.D.   On: 04/05/2015 15:41    ASSESSMENT AND PLAN: This is a very pleasant 81 years old white male recently diagnosed with stage IV non-small cell lung cancer, poorly differentiated adenocarcinoma. He has a single 102m ring like enhancing lesion bordering the posterior aspect of the right temporal horn of the right lateral ventricle. He is status post SRS treatment to the single brain lesion. He is currently undergoing palliative radiotherapy to the left upper lobe and hilar mass. Last fraction of radiation therapy expected 03/27/2015. He is status post 3 cycles of systemic chemotherapy with carboplatin and Alimta and overall tolerated this chemotherapy relatively well except for chemotherapy-induced anemia. He did receive a blood transfusion recently to address the chemotherapy-induced induced anemia and feels better. He continues to have some constipation I recommended that he try either Senokot S or MiraLax.  The patient was discussed with and also seen by Dr. MJulien Nordmann His restaging CT scan of the chest showed positive response to therapy with decreased size of the left upper lobe mass and associated left-sided mediastinal/hilar lymphadenopathy. These results were discussed with the patient and his wife. He'll proceed with cycle #4 today as scheduled. He will continue with weekly labs as scheduled. He'll follow-up  in 3 weeks prior to the start of cycle #5. To address his urinary symptoms, he will be sent for a clean catch urinalysis and urine culture and sensitivity today. If an infection is present, appropriate antibiotic therapy will be instituted.  He was advised to call immediately if he has any concerning symptoms in the interval. The patient voices understanding of current disease status and treatment options and is in agreement with the current care plan.  All questions were answered. The patient knows to call the clinic with any problems, questions or concerns. We can certainly see the patient much sooner if necessary.  JCarlton Adam PA-C 04/08/2015  ADDENDUM:  Hematology/Oncology Attending:  I had a face to face encounter with the patient. I recommended his care plan. This is  a very pleasant 81 years old white male with metastatic non-small cell lung cancer, adenocarcinoma currently undergoing systemic chemotherapy with carboplatin and Alimta status post 3 cycles. The patient is tolerating his treatment fairly well with no significant adverse effects except for mild fatigue from chemotherapy-induced anemia. He recently received 2 units of PRBCs transfusion in addition to Granix for chemotherapy-induced neutropenia. His recent CT scan of the chest, abdomen and pelvis showed improvement in his disease. I discussed the scan results with the patient and his wife. I recommended for him to continue with the same systemic chemotherapy was carboplatin and Alimta. He will start cycle #4 today. The patient would come back for follow-up visit in 3 weeks for reevaluation before starting cycle #5. He was advised to call immediately if he has any concerning symptoms in the interval.  Disclaimer: This note was dictated with voice recognition software. Similar sounding words can inadvertently be transcribed and may be missed upon review. Eilleen Kempf., MD 04/13/2015

## 2015-04-08 NOTE — Telephone Encounter (Signed)
Pt confirmed labs/ov per 04/25 POF, gave pt AVS and Calendar.... Luke Contreras, sent msg to add chemo and sent pt to labs for urine sample and scheduled pt for chemo today per AJ and cancelled chemo for tomorrow.Marland KitchenMarland KitchenMarland Kitchen

## 2015-04-08 NOTE — Patient Instructions (Signed)
[facility] Discharge Instructions for Patients Receiving Chemotherapy  Today you received the following chemotherapy agents: Alimta/Carboplatin  To help prevent nausea and vomiting after your treatment, we encourage you to take your nausea medication as directed.   If you develop nausea and vomiting that is not controlled by your nausea medication, call the clinic.   BELOW ARE SYMPTOMS THAT SHOULD BE REPORTED IMMEDIATELY:  *FEVER GREATER THAN 100.5 F  *CHILLS WITH OR WITHOUT FEVER  NAUSEA AND VOMITING THAT IS NOT CONTROLLED WITH YOUR NAUSEA MEDICATION  *UNUSUAL SHORTNESS OF BREATH  *UNUSUAL BRUISING OR BLEEDING  TENDERNESS IN MOUTH AND THROAT WITH OR WITHOUT PRESENCE OF ULCERS  *URINARY PROBLEMS  *BOWEL PROBLEMS  UNUSUAL RASH Items with * indicate a potential emergency and should be followed up as soon as possible.  Feel free to call the clinic you have any questions or concerns. The clinic phone number is [phone].  Please show the CHEMO ALERT CARD at check-in to the Emergency Department and triage nurse.   

## 2015-04-08 NOTE — Patient Instructions (Signed)
Your CT scan showed decreased size of your tumor, indicating response to therapy Continue labs and chemotherapy as scheduled Follow-up in 3 weeks

## 2015-04-09 ENCOUNTER — Ambulatory Visit: Payer: Medicare Other

## 2015-04-09 ENCOUNTER — Other Ambulatory Visit: Payer: Medicare Other

## 2015-04-09 LAB — URINE CULTURE

## 2015-04-29 ENCOUNTER — Ambulatory Visit (HOSPITAL_BASED_OUTPATIENT_CLINIC_OR_DEPARTMENT_OTHER): Payer: Medicare Other | Admitting: Physician Assistant

## 2015-04-29 ENCOUNTER — Ambulatory Visit (HOSPITAL_BASED_OUTPATIENT_CLINIC_OR_DEPARTMENT_OTHER): Payer: Medicare Other

## 2015-04-29 ENCOUNTER — Telehealth: Payer: Self-pay | Admitting: *Deleted

## 2015-04-29 ENCOUNTER — Encounter: Payer: Self-pay | Admitting: Physician Assistant

## 2015-04-29 ENCOUNTER — Other Ambulatory Visit (HOSPITAL_BASED_OUTPATIENT_CLINIC_OR_DEPARTMENT_OTHER): Payer: Medicare Other

## 2015-04-29 ENCOUNTER — Telehealth: Payer: Self-pay | Admitting: Physician Assistant

## 2015-04-29 VITALS — BP 106/44 | HR 76 | Temp 97.9°F | Resp 18 | Ht 69.0 in | Wt 175.7 lb

## 2015-04-29 DIAGNOSIS — Z5111 Encounter for antineoplastic chemotherapy: Secondary | ICD-10-CM

## 2015-04-29 DIAGNOSIS — C7931 Secondary malignant neoplasm of brain: Secondary | ICD-10-CM

## 2015-04-29 DIAGNOSIS — C3412 Malignant neoplasm of upper lobe, left bronchus or lung: Secondary | ICD-10-CM

## 2015-04-29 DIAGNOSIS — C3492 Malignant neoplasm of unspecified part of left bronchus or lung: Secondary | ICD-10-CM

## 2015-04-29 DIAGNOSIS — R490 Dysphonia: Secondary | ICD-10-CM

## 2015-04-29 LAB — COMPREHENSIVE METABOLIC PANEL (CC13)
ALT: 9 U/L (ref 0–55)
AST: 20 U/L (ref 5–34)
Albumin: 2.9 g/dL — ABNORMAL LOW (ref 3.5–5.0)
Alkaline Phosphatase: 89 U/L (ref 40–150)
Anion Gap: 13 mEq/L — ABNORMAL HIGH (ref 3–11)
BUN: 12.9 mg/dL (ref 7.0–26.0)
CO2: 24 mEq/L (ref 22–29)
CREATININE: 1 mg/dL (ref 0.7–1.3)
Calcium: 8.7 mg/dL (ref 8.4–10.4)
Chloride: 103 mEq/L (ref 98–109)
EGFR: 70 mL/min/{1.73_m2} — ABNORMAL LOW (ref >90)
Glucose: 126 mg/dl (ref 70–140)
Potassium: 3.3 mEq/L — ABNORMAL LOW (ref 3.5–5.1)
Sodium: 140 mEq/L (ref 136–145)
Total Bilirubin: 0.58 mg/dL (ref 0.20–1.20)
Total Protein: 5.9 g/dL — ABNORMAL LOW (ref 6.4–8.3)

## 2015-04-29 LAB — CBC WITH DIFFERENTIAL/PLATELET
BASO%: 0 % (ref 0.0–2.0)
Basophils Absolute: 0 10*3/uL (ref 0.0–0.1)
EOS%: 0 % (ref 0.0–7.0)
Eosinophils Absolute: 0 10*3/uL (ref 0.0–0.5)
HCT: 26.2 % — ABNORMAL LOW (ref 38.4–49.9)
HGB: 8.6 g/dL — ABNORMAL LOW (ref 13.0–17.1)
LYMPH%: 9 % — AB (ref 14.0–49.0)
MCH: 30.6 pg (ref 27.2–33.4)
MCHC: 32.8 g/dL (ref 32.0–36.0)
MCV: 93.2 fL (ref 79.3–98.0)
MONO#: 0.5 10*3/uL (ref 0.1–0.9)
MONO%: 11.5 % (ref 0.0–14.0)
NEUT%: 79.5 % — AB (ref 39.0–75.0)
NEUTROS ABS: 3.3 10*3/uL (ref 1.5–6.5)
PLATELETS: 115 10*3/uL — AB (ref 140–400)
RBC: 2.81 10*6/uL — AB (ref 4.20–5.82)
RDW: 24.6 % — ABNORMAL HIGH (ref 11.0–14.6)
WBC: 4.1 10*3/uL (ref 4.0–10.3)
lymph#: 0.4 10*3/uL — ABNORMAL LOW (ref 0.9–3.3)

## 2015-04-29 MED ORDER — DEXAMETHASONE SODIUM PHOSPHATE 100 MG/10ML IJ SOLN
Freq: Once | INTRAMUSCULAR | Status: AC
  Administered 2015-04-29: 13:00:00 via INTRAVENOUS
  Filled 2015-04-29: qty 8

## 2015-04-29 MED ORDER — SODIUM CHLORIDE 0.9 % IV SOLN
Freq: Once | INTRAVENOUS | Status: AC
  Administered 2015-04-29: 13:00:00 via INTRAVENOUS

## 2015-04-29 MED ORDER — SODIUM CHLORIDE 0.9 % IV SOLN
400.0000 mg/m2 | Freq: Once | INTRAVENOUS | Status: AC
  Administered 2015-04-29: 800 mg via INTRAVENOUS
  Filled 2015-04-29: qty 32

## 2015-04-29 MED ORDER — CARBOPLATIN CHEMO INJECTION 450 MG/45ML
363.6000 mg | Freq: Once | INTRAVENOUS | Status: AC
  Administered 2015-04-29: 360 mg via INTRAVENOUS
  Filled 2015-04-29: qty 36

## 2015-04-29 NOTE — Progress Notes (Addendum)
[place] [phone]   Fax:([phone]) [place], [name] [address]  DIAGNOSIS: Metastatic non-small cell lung cancer, poorly differentiated adenocarcinoma diagnosed in January 2016.   PRIOR THERAPY:  1. Status post SRS treatment to single brain lesion under the care of Dr. Tammi Klippel.  2. Palliative radiation therapy under the care of the [facility]. Last fraction of radiation therapy expected 03/27/2015.  CURRENT THERAPY:  Systemic chemotherapy with carboplatin for an AUC of 5 and Alimta at 500 mg/m given every 3 weeks. Status post 4 cycles. Starting with cycle #5 the carboplatin is for an AUC of 4 and the Alimta at 400 mg/m    INTERVAL HISTORY: Luke Contreras 81 y.o. male returns to the clinic today for follow-up visit accompanied by his wife. The patient is feeling fine today with no specific complaints except for the baseline shortness of breath in addition to hoarseness of his voice. He is tolerating his chemotherapy relatively well but is having significant fatigue associated with radiation therapy. He completed his radiation therapy on 03/27/2015.  He did recently require packed red blood cell transfusion for chemotherapy-induced anemia and feels significantly better after that procedure. He reports some constipation. He reports that he is having a bowel movement about every 3 days. He is not currently taking anything for the constipation.   He denied having any significant weight loss or night sweats. He has no chest pain but continues to have shortness breath with exertion with mild cough with no hemoptysis. The patient denied having any current significant nausea or vomiting. He is also here to proceed with cycle #5 of his systemic chemotherapy with carboplatin and Alimta.  MEDICAL HISTORY: Past Medical History  Diagnosis Date  . History of acute anterior wall myocardial infarction  11/2006  . Atrial fibrillation   . Dyslipidemia   . Hypertension   . Coronary artery disease     history of  obstructive single-valve disease  . PAC (premature atrial contraction)   . Left ventricular dysfunction     withEF of 40-45%   . Complication of anesthesia   . Shortness of breath dyspnea     " when talking and completing a long sentence  . GERD (gastroesophageal reflux disease)   . Arthritis     hands, knees  . Cancer     skin cancer- side of nausea    ALLERGIES:  is allergic to cephalexin and aspirin.  MEDICATIONS:  Current Outpatient Prescriptions  Medication Sig Dispense Refill  . alum & mag hydroxide-simeth (MAALOX/MYLANTA) 200-200-20 MG/5ML suspension Take by mouth every 6 (six) hours as needed for indigestion or heartburn.    . Alum & Mag Hydroxide-Simeth (MAGIC MOUTHWASH W/LIDOCAINE) SOLN Take 5 mLs by mouth 4 (four) times daily as needed for mouth pain ([facility] mouthwash formula w/ lidocaine 1:1.). 240 mL 1  . atorvastatin (LIPITOR) 40 MG tablet TAKE 1/2 TABLET (= 20 MG   TOTAL) DAILY 45 tablet 2  . clorazepate (TRANXENE) 7.5 MG tablet Take 7.5 mg by mouth at bedtime.      . Cyanocobalamin (VITAMIN B-12 PO) Take 500 mcg by mouth daily.     Marland Kitchen dexamethasone (DECADRON) 4 MG tablet Take 1 tablet by mouth twice daily with food the day before, the day of and the day after chemotherapy 30 tablet 1  . Dextromethorphan-Guaifenesin (CORICIDIN HBP CONGESTION/COUGH PO) Take by mouth daily as needed.    Marland Kitchen  emollient (BIAFINE) cream Apply topically as needed.    . fluorouracil (EFUDEX) 5 % cream Apply 1 application topically daily.   0  . folic acid (FOLVITE) 1 MG tablet Take 1 tablet (1 mg total) by mouth daily. 30 tablet 3  . lidocaine (XYLOCAINE) 2 % solution Patient: Mix 1part 2% viscous lidocaine, 1part H20. Swish and/or swallow 54m of this mixture, 329m before meals and at bedtime, up to QID 100 mL 1  . lisinopril (PRINIVIL,ZESTRIL) 2.5 MG tablet Take 1 tablet (2.5 mg  total) by mouth daily. 90 tablet 0  . Misc Natural Products (OSTEO BI-FLEX JOINT SHIELD PO) Take 1 tablet by mouth daily.      . multivitamin (THERAGRAN) per tablet Take 1 tablet by mouth daily.      . Marland Kitchenmeprazole (PRILOSEC) 20 MG capsule Take 20 mg by mouth daily.     . prochlorperazine (COMPAZINE) 10 MG tablet Take 1 tablet (10 mg total) by mouth every 6 (six) hours as needed for nausea or vomiting. 30 tablet 1  . sucralfate (CARAFATE) 1 G tablet Dissolve 1 tablet in 10 mL H20 and swallow up to QID, in between meals, to soothe esophagus. 60 tablet 1  . Wound Cleansers (RADIAPLEX EX) Apply topically.    . nitroGLYCERIN (NITROSTAT) 0.4 MG SL tablet Place 1 tablet (0.4 mg total) under the tongue every 5 (five) minutes as needed for chest pain. (Patient not taking: Reported on 04/29/2015) 25 tablet 5  . oxyCODONE-acetaminophen (PERCOCET/ROXICET) 5-325 MG per tablet Take 1 tablet by mouth every 4 (four) hours as needed for severe pain. (Patient not taking: Reported on 04/29/2015) 30 tablet 0   No current facility-administered medications for this visit.    SURGICAL HISTORY:  Past Surgical History  Procedure Laterality Date  . Cardiac catheterization  11/26/2006    Est. EF of 40% --  A 100% obstructive proximal/mid left anterior descending artery consistent with recent anterior myocardial infarction --  Moderate mid right coronary artery disease --Mild left ventricular dysfunction with EF of 40%  with distal anterior akinesis and periapical dyskinesis    -- TeKaylyn Lim M.D.  . Colonoscopy    . Video bronchoscopy with endobronchial ultrasound N/A 01/11/2015    Procedure: VIDEO BRONCHOSCOPY WITH ENDOBRONCHIAL ULTRASOUND;  Surgeon: EdGrace IsaacMD;  [facility];  Laterality: N/A;  . Lung biopsy N/A 01/11/2015    Procedure: LUNG BIOPSY;  Surgeon: EdGrace IsaacMD;  [facility];  Laterality: N/A;    REVIEW OF SYSTEMS:  Constitutional:  negative Eyes: negative Ears, nose, mouth, throat, and face: positive for hoarseness Respiratory: positive for dyspnea on exertion Cardiovascular: negative Gastrointestinal: positive for constipation Genitourinary:positive for decreased stream and frequency Integument/breast: negative Hematologic/lymphatic: negative Musculoskeletal:negative Neurological: negative Behavioral/Psych: negative Endocrine: negative Allergic/Immunologic: negative   PHYSICAL EXAMINATION: General appearance: alert, cooperative, fatigued and no distress Head: Normocephalic, without obvious abnormality, atraumatic Neck: no adenopathy, no JVD, supple, symmetrical, trachea midline and thyroid not enlarged, symmetric, no tenderness/mass/nodules Lymph nodes: Cervical, supraclavicular, and axillary nodes normal. Resp: clear to auscultation bilaterally Back: symmetric, no curvature. ROM normal. No CVA tenderness. Cardio: regular rate and rhythm, S1, S2 normal, no murmur, click, rub or gallop GI: soft, non-tender; bowel sounds normal; no masses,  no organomegaly Extremities: extremities normal, atraumatic, no cyanosis or edema Neurologic: Alert and oriented X 3, normal strength and tone. Normal symmetric reflexes. Normal coordination and gait  ECOG PERFORMANCE STATUS: 1 - Symptomatic but completely  ambulatory  Blood pressure 106/44, pulse 76, temperature 97.9 F (36.6 C), temperature source Oral, resp. rate 18, height '5\' 9"'$  (1.753 m), weight 175 lb 11.2 oz (79.697 kg), SpO2 98 %.  LABORATORY DATA: Lab Results  Component Value Date   WBC 4.1 04/29/2015   HGB 8.6* 04/29/2015   HCT 26.2* 04/29/2015   MCV 93.2 04/29/2015   PLT 115* 04/29/2015      Chemistry      Component Value Date/Time   NA 140 04/29/2015 1042   NA 142 01/11/2015 0726   K 3.3* 04/29/2015 1042   K 3.6 01/11/2015 0726   CL 105 01/11/2015 0726   CO2 24 04/29/2015 1042   CO2 32 01/11/2015 0726   BUN 12.9 04/29/2015 1042   BUN 10  01/11/2015 0726   CREATININE 1.0 04/29/2015 1042   CREATININE 0.93 01/11/2015 0726   CREATININE 0.88 12/29/2014 1207      Component Value Date/Time   CALCIUM 8.7 04/29/2015 1042   CALCIUM 8.9 01/11/2015 0726   ALKPHOS 89 04/29/2015 1042   ALKPHOS 106 01/11/2015 0726   AST 20 04/29/2015 1042   AST 26 01/11/2015 0726   ALT 9 04/29/2015 1042   ALT 14 01/11/2015 0726   BILITOT 0.58 04/29/2015 1042   BILITOT 1.1 01/11/2015 0726       RADIOGRAPHIC STUDIES: Ct Chest W Contrast  04/05/2015   CLINICAL DATA:  81 year old male recently diagnosed with left upper lobe lung cancer status post chemotherapy and radiation therapy completed in April 2016. Productive cough.  EXAM: CT CHEST WITH CONTRAST  TECHNIQUE: Multidetector CT imaging of the chest was performed during intravenous contrast administration.  CONTRAST:  33m OMNIPAQUE IOHEXOL 300 MG/ML  SOLN  COMPARISON:  PET-CT 01/18/2015.  FINDINGS: Mediastinum/Lymph Nodes: Abnormal soft tissue in the left suprahilar region and AP window region, contiguous with the left upper lobe mass appears smaller than the prior examination. Presumably some of this is residual nodal disease. This is amorphous in appearance and difficult to separate from the adjacent mass, so therefore difficult to discretely measure. No other mediastinal or right hilar lymphadenopathy is noted. Heart size is normal. There is no significant pericardial fluid, thickening or pericardial calcification. There is atherosclerosis of the thoracic aorta, the great vessels of the mediastinum and the coronary arteries, including calcified atherosclerotic plaque in the left anterior descending and right coronary arteries. Esophagus is unremarkable in appearance. No axillary lymphadenopathy.  Lungs/Pleura: Previously noted left upper lobe mass has significantly decreased in size, and remains contiguous with adjacent left suprahilar and AP window lymphadenopathy. The bulkiest portion of this mass and  adjacent adenopathy measures up to 4.6 x 3.9 cm (image 22 of series 2). There is a large amount of adjacent atelectatic lung parenchyma extending anteriorly and upwards into the left apex, similar prior studies, such that the entire left upper lobe is either involve with this mass or is chronically collapsed at this time. Compensatory hyperexpansion of the left lower lobe is noted. 6 mm subpleural nodule in the posterior aspect of the right upper lobe (image 16 of series 5) is unchanged. There is also a 6 mm subpleural nodule in the periphery of the right upper lobe on image 27 of series 5, which is new compared to the prior examination, but nonspecific. Mild diffuse bronchial wall thickening with mild to moderate centrilobular emphysema. Trace amount of dependent pleural fluid and/or thickening in the left hemithorax. No right pleural effusion.  Upper Abdomen: Tiny calcified gallstones in the fundus of  the gallbladder. Gallbladder is nearly completely decompressed. Gallbladder wall appears diffusely thickened and likely edematous measuring up to 8 mm in thickness. No pericholecystic fluid or overt surrounding inflammatory changes. Mild scarring in the lateral aspect of the left kidney. Exophytic 3 cm simple cyst in the upper pole of the left kidney. Atherosclerosis. Unchanged 1.2 cm hypovascular lesion in the spleen, nonspecific, but favored to be benign.  Musculoskeletal/Soft Tissues: Lytic lesion in the lateral aspect of the right first rib again noted. No other definite aggressive appearing lytic or blastic lesions are noted in the visualized portions of the skeleton.  IMPRESSION: 1. Today's study demonstrates a positive response to therapy with decreased size of left upper lobe mass and associated left sided mediastinal/hilar lymphadenopathy, as discussed above. There is persistent complete atelectasis of the remaining portions of the left upper lobe. 2. Tiny 6 mm subpleural nodule in the posterior aspect of  the right upper lobe (which was previously hypermetabolic) is essentially unchanged. There is a new 6 mm subpleural nodule in the adjacent right upper lobe as well. 3. Lytic lesion in the lateral aspect of the right first rib again noted. No new osseous lesions are noted. 4. Cholelithiasis. Gallbladder is contracted, with a thickened edematous wall. However, there are no overt imaging findings to suggest acute cholecystitis at this time. The possibility of chronic cholecystitis is not excluded, and clinical correlation is suggested. 5.  Atherosclerosis, including 2 vessel coronary artery disease.   Electronically Signed   By: Vinnie Langton M.D.   On: 04/05/2015 15:41    ASSESSMENT AND PLAN: This is a very pleasant 81 years old white male recently diagnosed with stage IV non-small cell lung cancer, poorly differentiated adenocarcinoma. He has a single 67m ring like enhancing lesion bordering the posterior aspect of the right temporal horn of the right lateral ventricle. He is status post SRS treatment to the single brain lesion. He is currently undergoing palliative radiotherapy to the left upper lobe and hilar mass. Last fraction of radiation therapy expected 03/27/2015. He is status post 3 cycles of systemic chemotherapy with carboplatin and Alimta and overall tolerated this chemotherapy relatively well except for chemotherapy-induced anemia. He did receive a blood transfusion recently to address the chemotherapy-induced induced anemia and feels better. His hemoglobin today is 8.6 g/dL we'll monitor this closely and will arrange packed red blood cell transfusion if he has a significant decrease and/or is symptomatic from this level of anemia. He continues to have some constipation. I have recommended that he take merely asked in the morning and 2 Senokot S tablets in the evening. He is also to increase his dietary intake of fiber and continue good by mouth fluid intake. We'll refer him to an ear, nose and  throat specialist regarding further evaluation and management of his voice hoarseness.  The patient was discussed with and also seen by Dr. MJulien Nordmann His restaging CT scan of the chest showed positive response to therapy with decreased size of the left upper lobe mass and associated left-sided mediastinal/hilar lymphadenopathy. He will proceed with cycle #5 today as scheduled however we will dose reduce the carboplatin to an AUC of 4 and the Alimta to 400 mg/m given the effect of chemotherapy on his hemoglobin. His potassium is slightly low today at 3.3. I've asked him to increase his dietary intake of potassium-rich foods and these were reviewed with the patient. Both the patient and his wife voiced understanding. He will continue weekly labs as scheduled and follow-up in 3  weeks prior to cycle #6.  He was advised to call immediately if he has any concerning symptoms in the interval. The patient voices understanding of current disease status and treatment options and is in agreement with the current care plan.  All questions were answered. The patient knows to call the clinic with any problems, questions or concerns. We can certainly see the patient much sooner if necessary.  Carlton Adam, PA-C 04/29/2015  ADDENDUM: Hematology/Oncology Attending: I had a face to face encounter with the patient. I recommended his care plan. This is a very pleasant 81 years old white male with metastatic non-small cell lung cancer currently undergoing systemic chemotherapy with carboplatin and Alimta status post 4 cycles. The patient is tolerating his treatment fairly well except for mild fatigue. I recommended for him to proceed with cycle #5 today as a scheduled. I will reduce the dose of his carboplatin to AUC of 4 and Alimta to 400 MG/M2 secondary to previous pancytopenia and fatigue. He would come back for follow-up visit in 3 weeks for reevaluation before starting cycle #6. He was advised to call immediately  if he has any concerning symptoms in the interval.  Disclaimer: This note was dictated with voice recognition software. Similar sounding words can inadvertently be transcribed and may be missed upon review. Eilleen Kempf., MD 04/29/2015

## 2015-04-29 NOTE — Patient Instructions (Signed)
[facility] Discharge Instructions for Patients Receiving Chemotherapy  Today you received the following chemotherapy agents: Alimta and Carboplatin.  To help prevent nausea and vomiting after your treatment, we encourage you to take your nausea medication as directed.   If you develop nausea and vomiting that is not controlled by your nausea medication, call the clinic.   BELOW ARE SYMPTOMS THAT SHOULD BE REPORTED IMMEDIATELY:  *FEVER GREATER THAN 100.5 F  *CHILLS WITH OR WITHOUT FEVER  NAUSEA AND VOMITING THAT IS NOT CONTROLLED WITH YOUR NAUSEA MEDICATION  *UNUSUAL SHORTNESS OF BREATH  *UNUSUAL BRUISING OR BLEEDING  TENDERNESS IN MOUTH AND THROAT WITH OR WITHOUT PRESENCE OF ULCERS  *URINARY PROBLEMS  *BOWEL PROBLEMS  UNUSUAL RASH Items with * indicate a potential emergency and should be followed up as soon as possible.  Feel free to call the clinic you have any questions or concerns. The clinic phone number is [phone].  Please show the CHEMO ALERT CARD at check-in to the Emergency Department and triage nurse.   

## 2015-04-29 NOTE — Patient Instructions (Signed)
For constipation take merely asked in the morning and 2 Senokot-S tablets in the evening. Your potassium level was slightly low, increase your dietary intake of potassium-rich foods as discussed. Continue weekly labs as scheduled Follow-up in 3 weeks prior to your next scheduled cycle of chemotherapy

## 2015-04-29 NOTE — Telephone Encounter (Signed)
Pt confirmed labs/ov per 05/16 POF, gave pt AVS and sent msg to add chemo and then will mail out schedule... KJ

## 2015-04-29 NOTE — Telephone Encounter (Signed)
Per staff message and POF I have scheduled appts. Advised scheduler of appts. JMW  

## 2015-05-07 ENCOUNTER — Encounter: Payer: Self-pay | Admitting: Radiation Therapy

## 2015-05-07 ENCOUNTER — Ambulatory Visit
Admission: RE | Admit: 2015-05-07 | Discharge: 2015-05-07 | Disposition: A | Discharge status: Home / Self Care | Payer: Medicare Other | Source: Ambulatory Visit | Attending: Radiation Oncology | Admitting: Radiation Oncology

## 2015-05-07 DIAGNOSIS — C7931 Secondary malignant neoplasm of brain: Secondary | ICD-10-CM

## 2015-05-07 MED ORDER — GADOBENATE DIMEGLUMINE 529 MG/ML IV SOLN
17.0000 mL | Freq: Once | INTRAVENOUS | Status: AC | PRN
Start: 2015-05-07 — End: 2015-05-07
  Administered 2015-05-07: 17 mL via INTRAVENOUS

## 2015-05-08 ENCOUNTER — Encounter: Payer: Self-pay | Admitting: Radiation Oncology

## 2015-05-08 ENCOUNTER — Ambulatory Visit
Admission: RE | Admit: 2015-05-08 | Discharge: 2015-05-08 | Disposition: A | Discharge status: Home / Self Care | Payer: Medicare Other | Source: Ambulatory Visit | Attending: Radiation Oncology | Admitting: Radiation Oncology

## 2015-05-08 VITALS — BP 123/88 | HR 109 | Resp 16 | Wt 173.3 lb

## 2015-05-08 DIAGNOSIS — C7931 Secondary malignant neoplasm of brain: Secondary | ICD-10-CM

## 2015-05-08 NOTE — Progress Notes (Signed)
Weight and vitals stable. Denies pain. Hoarseness continues. Denies difficult or painful swallowing. Dry cough noted. Skin of upper chest and back have returned to normal color and appearance. Reports energy level is improving.

## 2015-05-08 NOTE — Progress Notes (Signed)
81 yo man with a solitary 9 mm brain metastasis from poorly differentiated adenocarcinoma of the left upper lung - Stage IV status post 1.   2.  02/11/2015-03/27/2015  The patient's primary tumor and involved lymph nodes were treated to 66 Gy in 33 fractions

## 2015-05-08 NOTE — Progress Notes (Signed)
Radiation Oncology         [phone]   Name: Luke Contreras   Date: 05/08/2015   MRN: [id number]  DOB: [birth year]    Multidisciplinary Brain and Spine Oncology Clinic Follow-Up Visit Note  CC: Luke Contreras, Luke Given, MD  Luke Dy, MD    ICD-9-CM ICD-10-CM   1. 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  Brain SRS - 02/07/2015 - Right Periventricular Temporal 9 mm target was treated using 2 Dynamic Conformal Arcs to a prescription dose of 20 Gy  2.  Chest Radiation - 02/11/2015-03/27/2015 - The patient's primary tumor and involved lymph nodes were treated to 66 Gy in 33 fractions   Interval Since Last Radiation:  4  weeks  Narrative:  The patient returns today for routine follow-up.  The recent films were presented in our multidisciplinary conference with neuroradiology just prior to the clinic.  Weight and vitals stable. Denies pain. Hoarseness and extreme difficulty speaking continues. Denies difficult or painful swallowing. Dry cough noted. Skin of upper chest and back have returned to normal color and appearance. Reports energy level is improving, but still experiencing fatigue. Expresses concern about a lesion on his nose.              ALLERGIES:  is allergic to cephalexin and aspirin.  Meds: Current Outpatient Prescriptions  Medication Sig Dispense Refill  . alum & mag hydroxide-simeth (MAALOX/MYLANTA) 200-200-20 MG/5ML suspension Take by mouth every 6 (six) hours as needed for indigestion or heartburn.    Marland Kitchen atorvastatin (LIPITOR) 40 MG tablet TAKE 1/2 TABLET (= 20 MG   TOTAL) DAILY 45 tablet 2  . clorazepate (TRANXENE) 7.5 MG tablet Take 7.5 mg by mouth at bedtime.      . Cyanocobalamin (VITAMIN B-12 PO) Take 500 mcg by mouth daily.     Marland Kitchen dexamethasone (DECADRON) 4 MG tablet Take 1 tablet by mouth twice  daily with food the day before, the day of and the day after chemotherapy 30 tablet 1  . Dextromethorphan-Guaifenesin (CORICIDIN HBP CONGESTION/COUGH PO) Take by mouth daily as needed.    Marland Kitchen emollient (BIAFINE) cream Apply topically as needed.    . fluorouracil (EFUDEX) 5 % cream Apply 1 application topically daily.   0  . folic acid (FOLVITE) 1 MG tablet Take 1 tablet (1 mg total) by mouth daily. 30 tablet 3  . lidocaine (XYLOCAINE) 2 % solution Patient: Mix 1part 2% viscous lidocaine, 1part H20. Swish and/or swallow 100m of this mixture, 356m before meals and at bedtime, up to QID 100 mL 1  . lisinopril (PRINIVIL,ZESTRIL) 2.5 MG tablet Take 1 tablet (2.5 mg total) by mouth daily. 90 tablet 0  . Misc Natural Products (OSTEO BI-FLEX JOINT SHIELD PO) Take 1 tablet by mouth daily.      . multivitamin (THERAGRAN) per tablet Take 1 tablet by mouth daily.      . Marland Kitchenmeprazole (PRILOSEC) 20 MG capsule Take 20 mg by mouth daily.     . prochlorperazine (COMPAZINE) 10 MG tablet Take 1 tablet (10 mg total) by mouth every 6 (six) hours as needed for nausea or vomiting. 30 tablet 1  . Wound Cleansers (RADIAPLEX EX) Apply topically.    . Alum & Mag Hydroxide-Simeth (MAGIC MOUTHWASH W/LIDOCAINE) SOLN Take 5 mLs by mouth 4 (four) times  daily as needed for mouth pain ([employer] mouthwash formula w/ lidocaine 1:1.). (Patient not taking: Reported on 05/08/2015) 240 mL 1  . nitroGLYCERIN (NITROSTAT) 0.4 MG SL tablet Place 1 tablet (0.4 mg total) under the tongue every 5 (five) minutes as needed for chest pain. (Patient not taking: Reported on 04/29/2015) 25 tablet 5  . oxyCODONE-acetaminophen (PERCOCET/ROXICET) 5-325 MG per tablet Take 1 tablet by mouth every 4 (four) hours as needed for severe pain. (Patient not taking: Reported on 04/29/2015) 30 tablet 0  . sucralfate (CARAFATE) 1 G tablet Dissolve 1 tablet in 10 mL H20 and swallow up to QID, in between meals, to soothe esophagus. (Patient not taking: Reported on 05/08/2015) 60  tablet 1   No current facility-administered medications for this encounter.    Physical Findings: The patient is in no acute distress. Patient is alert and oriented. 5 mm erythematous legion located on the right nasal ala.  weight is 173 lb 4.8 oz (78.608 kg). His blood pressure is 123/88 and his pulse is 109. His respiration is 16. .  No significant changes.  Lab Findings: Lab Results  Component Value Date   WBC 4.1 04/29/2015   HGB 8.6* 04/29/2015   HCT 26.2* 04/29/2015   MCV 93.2 04/29/2015   PLT 115* 04/29/2015    '@LASTCHEM'$ @  Radiographic Findings: Mr Luke Contreras XL Contrast  05/07/2015   CLINICAL DATA:  SRS restaging.  Metastatic lung cancer.  EXAM: MRI HEAD WITHOUT AND WITH CONTRAST  TECHNIQUE: Multiplanar, multiecho pulse sequences of the brain and surrounding structures were obtained without and with intravenous contrast.  CONTRAST:  62m MULTIHANCE GADOBENATE DIMEGLUMINE 529 MG/ML IV SOLN  COMPARISON:  None.  FINDINGS: Positive response to targeted radiotherapy of the previously identified RIGHT posterior temporal periventricular solitary brain metastasis. Cross-sectional measurements of the lesion on image 16 series 10 are 3 x 8 mm. No significant surrounding edema. Diminished and minor diffusion restriction.  No new lesions are evident. Significant generalized atrophy. No acute stroke, hemorrhage, or focal mass lesion. No midline shift. Flow voids remain patent. Cervical spondylosis. Mild chronic sinus disease.  IMPRESSION: Improved appearance status post targeted Radiotherapy to the previous identified RIGHT posterior temporal periventricular metastasis. No new findings are evident.   Electronically Signed   By: JRolla FlattenM.D.   On: 05/07/2015 14:23    Impression:  The patient is recovering from the effects of radiation.  His MRI shows a good response.  Plan:  Recommend follow up with dermatologist to address area of concern in the nose. Repeat MRI and follow up in 3  months.  This document serves as a record of services personally performed by MTyler Pita MD. It was created on his behalf by SLenn Contreras a trained medical scribe. The creation of this record is based on the scribe's personal observations and the provider's statements to them. This document has been checked and approved by the attending provider.   _____________________________________  MSheral Apley MTammi Klippel M.D.

## 2015-05-10 ENCOUNTER — Telehealth: Payer: Self-pay | Admitting: Medical Oncology

## 2015-05-10 NOTE — Telephone Encounter (Signed)
Son called and reported that Luke Contreras fell Tuesday on concrete and hit his head and he has a black eye. Son stated his dad denied headaches. I instructed son to have pt evaluated in ED . He asked about seeing PCP or urgent care. I said that was fine as long as Luke Contreras is evaluated today .

## 2015-05-20 ENCOUNTER — Encounter: Payer: Self-pay | Admitting: Oncology

## 2015-05-20 ENCOUNTER — Encounter: Payer: Self-pay | Admitting: Physician Assistant

## 2015-05-20 ENCOUNTER — Telehealth: Payer: Self-pay | Admitting: Internal Medicine

## 2015-05-20 ENCOUNTER — Ambulatory Visit (HOSPITAL_BASED_OUTPATIENT_CLINIC_OR_DEPARTMENT_OTHER): Payer: Medicare Other

## 2015-05-20 ENCOUNTER — Ambulatory Visit: Payer: Medicare Other

## 2015-05-20 ENCOUNTER — Ambulatory Visit (HOSPITAL_BASED_OUTPATIENT_CLINIC_OR_DEPARTMENT_OTHER): Payer: Medicare Other | Admitting: Physician Assistant

## 2015-05-20 ENCOUNTER — Other Ambulatory Visit (HOSPITAL_BASED_OUTPATIENT_CLINIC_OR_DEPARTMENT_OTHER): Payer: Medicare Other

## 2015-05-20 ENCOUNTER — Ambulatory Visit (HOSPITAL_COMMUNITY)
Admission: RE | Admit: 2015-05-20 | Discharge: 2015-05-20 | Disposition: A | Discharge status: Home / Self Care | Payer: Medicare Other | Source: Ambulatory Visit | Attending: Internal Medicine | Admitting: Internal Medicine

## 2015-05-20 VITALS — BP 124/89 | HR 98 | Temp 98.0°F | Resp 17 | Ht 69.0 in | Wt 176.3 lb

## 2015-05-20 VITALS — BP 120/54 | HR 71 | Temp 97.8°F | Resp 18

## 2015-05-20 DIAGNOSIS — D6481 Anemia due to antineoplastic chemotherapy: Secondary | ICD-10-CM

## 2015-05-20 DIAGNOSIS — C3412 Malignant neoplasm of upper lobe, left bronchus or lung: Secondary | ICD-10-CM

## 2015-05-20 DIAGNOSIS — C3492 Malignant neoplasm of unspecified part of left bronchus or lung: Secondary | ICD-10-CM

## 2015-05-20 DIAGNOSIS — T451X5A Adverse effect of antineoplastic and immunosuppressive drugs, initial encounter: Secondary | ICD-10-CM

## 2015-05-20 DIAGNOSIS — C7931 Secondary malignant neoplasm of brain: Secondary | ICD-10-CM

## 2015-05-20 DIAGNOSIS — Z5111 Encounter for antineoplastic chemotherapy: Secondary | ICD-10-CM

## 2015-05-20 LAB — COMPREHENSIVE METABOLIC PANEL (CC13)
ALT: 14 U/L (ref 0–55)
AST: 35 U/L — ABNORMAL HIGH (ref 5–34)
Albumin: 3.1 g/dL — ABNORMAL LOW (ref 3.5–5.0)
Alkaline Phosphatase: 101 U/L (ref 40–150)
Anion Gap: 11 mEq/L (ref 3–11)
BUN: 11 mg/dL (ref 7.0–26.0)
CALCIUM: 8.7 mg/dL (ref 8.4–10.4)
CO2: 25 mEq/L (ref 22–29)
Chloride: 102 mEq/L (ref 98–109)
Creatinine: 1 mg/dL (ref 0.7–1.3)
EGFR: 73 mL/min/{1.73_m2} — AB (ref >90)
GLUCOSE: 182 mg/dL — AB (ref 70–140)
POTASSIUM: 3.5 meq/L (ref 3.5–5.1)
Sodium: 137 mEq/L (ref 136–145)
Total Bilirubin: 0.78 mg/dL (ref 0.20–1.20)
Total Protein: 6.1 g/dL — ABNORMAL LOW (ref 6.4–8.3)

## 2015-05-20 LAB — CBC WITH DIFFERENTIAL/PLATELET
BASO%: 0.1 % (ref 0.0–2.0)
Basophils Absolute: 0 10*3/uL (ref 0.0–0.1)
EOS%: 0.1 % (ref 0.0–7.0)
Eosinophils Absolute: 0 10*3/uL (ref 0.0–0.5)
HEMATOCRIT: 23.5 % — AB (ref 38.4–49.9)
HEMOGLOBIN: 7.8 g/dL — AB (ref 13.0–17.1)
LYMPH%: 6.4 % — ABNORMAL LOW (ref 14.0–49.0)
MCH: 33 pg (ref 27.2–33.4)
MCHC: 33.3 g/dL (ref 32.0–36.0)
MCV: 99.3 fL — ABNORMAL HIGH (ref 79.3–98.0)
MONO#: 0.2 10*3/uL (ref 0.1–0.9)
MONO%: 5.5 % (ref 0.0–14.0)
NEUT%: 87.9 % — AB (ref 39.0–75.0)
NEUTROS ABS: 2.9 10*3/uL (ref 1.5–6.5)
Platelets: 123 10*3/uL — ABNORMAL LOW (ref 140–400)
RBC: 2.37 10*6/uL — ABNORMAL LOW (ref 4.20–5.82)
RDW: 25.4 % — AB (ref 11.0–14.6)
WBC: 3.3 10*3/uL — ABNORMAL LOW (ref 4.0–10.3)
lymph#: 0.2 10*3/uL — ABNORMAL LOW (ref 0.9–3.3)

## 2015-05-20 LAB — HOLD TUBE, BLOOD BANK

## 2015-05-20 LAB — PREPARE RBC (CROSSMATCH)

## 2015-05-20 MED ORDER — CYANOCOBALAMIN 1000 MCG/ML IJ SOLN
1000.0000 ug | Freq: Once | INTRAMUSCULAR | Status: AC
  Administered 2015-05-20: 1000 ug via INTRAMUSCULAR

## 2015-05-20 MED ORDER — ACETAMINOPHEN 325 MG PO TABS
650.0000 mg | ORAL_TABLET | Freq: Once | ORAL | Status: AC
  Administered 2015-05-20: 650 mg via ORAL

## 2015-05-20 MED ORDER — SODIUM CHLORIDE 0.9 % IV SOLN
Freq: Once | INTRAVENOUS | Status: AC
  Administered 2015-05-20: 14:00:00 via INTRAVENOUS

## 2015-05-20 MED ORDER — SODIUM CHLORIDE 0.9 % IV SOLN
250.0000 mL | Freq: Once | INTRAVENOUS | Status: AC
  Administered 2015-05-20: 250 mL via INTRAVENOUS

## 2015-05-20 MED ORDER — ACETAMINOPHEN 325 MG PO TABS
ORAL_TABLET | ORAL | Status: AC
  Filled 2015-05-20: qty 2

## 2015-05-20 MED ORDER — DIPHENHYDRAMINE HCL 25 MG PO CAPS
ORAL_CAPSULE | ORAL | Status: AC
  Filled 2015-05-20: qty 1

## 2015-05-20 MED ORDER — SODIUM CHLORIDE 0.9 % IV SOLN
400.0000 mg/m2 | Freq: Once | INTRAVENOUS | Status: AC
  Administered 2015-05-20: 800 mg via INTRAVENOUS
  Filled 2015-05-20: qty 32

## 2015-05-20 MED ORDER — SODIUM CHLORIDE 0.9 % IV SOLN
360.0000 mg | Freq: Once | INTRAVENOUS | Status: AC
  Administered 2015-05-20: 360 mg via INTRAVENOUS
  Filled 2015-05-20: qty 36

## 2015-05-20 MED ORDER — CYANOCOBALAMIN 1000 MCG/ML IJ SOLN
INTRAMUSCULAR | Status: AC
  Filled 2015-05-20: qty 1

## 2015-05-20 MED ORDER — DEXAMETHASONE SODIUM PHOSPHATE 100 MG/10ML IJ SOLN
Freq: Once | INTRAMUSCULAR | Status: AC
  Administered 2015-05-20: 14:00:00 via INTRAVENOUS
  Filled 2015-05-20: qty 8

## 2015-05-20 MED ORDER — DIPHENHYDRAMINE HCL 25 MG PO CAPS
25.0000 mg | ORAL_CAPSULE | Freq: Once | ORAL | Status: AC
  Administered 2015-05-20: 25 mg via ORAL

## 2015-05-20 NOTE — Telephone Encounter (Signed)
Gave adn printed appt sched and avs for pt for June

## 2015-05-20 NOTE — Progress Notes (Signed)
[place] [phone]   Fax:([phone]) [address], [place] [address]  DIAGNOSIS: Metastatic non-small cell lung cancer, poorly differentiated adenocarcinoma diagnosed in January 2016.   PRIOR THERAPY:  1. Status post SRS treatment to single brain lesion under the care of Dr. Tammi Klippel.  2. Palliative radiation therapy under the care of the [facility]. Last fraction of radiation therapy expected 03/27/2015.  CURRENT THERAPY:  Systemic chemotherapy with carboplatin for an AUC of 5 and Alimta at 500 mg/m given every 3 weeks. Status post 5 cycles. Starting with cycle #5 the carboplatin is for an AUC of 4 and the Alimta at 400 mg/m    INTERVAL HISTORY: Luke Contreras 81 y.o. male returns to the clinic today for follow-up visit accompanied by his wife. The patient is feeling fine today with no specific complaints except for the baseline shortness of breath in addition to hoarseness of his voice. He did note an increase in his shortness of breath for approximately a week and a half. He has "settle down" and is back to his baseline level of shortness of breath. He reports some issues with constipation but is not taking stool softeners as previously recommended. He also occasionally reports problems sleeping and would like to try either Benadryl or is zzquill. He is tolerating his chemotherapy relatively well.   He reports some constipation. He reports that he is having a bowel movement about every 3 days. He is not currently taking anything for the constipation.   He denied having any significant weight loss or night sweats. He has no chest pain but continues to have shortness breath with exertion with mild cough with no hemoptysis. The patient denied having any current significant nausea or vomiting. He is also here to proceed with cycle #6 of his systemic chemotherapy with carboplatin and Alimta.  MEDICAL  HISTORY: Past Medical History  Diagnosis Date  . History of acute anterior wall myocardial infarction 11/2006  . Atrial fibrillation   . Dyslipidemia   . Hypertension   . Coronary artery disease     history of  obstructive single-valve disease  . PAC (premature atrial contraction)   . Left ventricular dysfunction     withEF of 40-45%   . Complication of anesthesia   . Shortness of breath dyspnea     " when talking and completing a long sentence  . GERD (gastroesophageal reflux disease)   . Arthritis     hands, knees  . Cancer     skin cancer- side of nausea    ALLERGIES:  is allergic to cephalexin and aspirin.  MEDICATIONS:  Current Outpatient Prescriptions  Medication Sig Dispense Refill  . alum & mag hydroxide-simeth (MAALOX/MYLANTA) 200-200-20 MG/5ML suspension Take by mouth every 6 (six) hours as needed for indigestion or heartburn.    . Alum & Mag Hydroxide-Simeth (MAGIC MOUTHWASH W/LIDOCAINE) SOLN Take 5 mLs by mouth 4 (four) times daily as needed for mouth pain ([employer] mouthwash formula w/ lidocaine 1:1.). 240 mL 1  . atorvastatin (LIPITOR) 40 MG tablet TAKE 1/2 TABLET (= 20 MG   TOTAL) DAILY 45 tablet 2  . clorazepate (TRANXENE) 7.5 MG tablet Take 7.5 mg by mouth at bedtime.      . Cyanocobalamin (VITAMIN B-12 PO) Take 500 mcg by mouth daily.     Marland Kitchen dexamethasone (DECADRON) 4 MG tablet Take 1 tablet by mouth twice daily with food the day  before, the day of and the day after chemotherapy 30 tablet 1  . Dextromethorphan-Guaifenesin (CORICIDIN HBP CONGESTION/COUGH PO) Take by mouth daily as needed.    Marland Kitchen emollient (BIAFINE) cream Apply topically as needed.    . fluorouracil (EFUDEX) 5 % cream Apply 1 application topically daily.   0  . folic acid (FOLVITE) 1 MG tablet Take 1 tablet (1 mg total) by mouth daily. 30 tablet 3  . lidocaine (XYLOCAINE) 2 % solution Patient: Mix 1part 2% viscous lidocaine, 1part H20. Swish and/or swallow 77m of this mixture, 365m before meals and  at bedtime, up to QID 100 mL 1  . lisinopril (PRINIVIL,ZESTRIL) 2.5 MG tablet Take 1 tablet (2.5 mg total) by mouth daily. 90 tablet 0  . Misc Natural Products (OSTEO BI-FLEX JOINT SHIELD PO) Take 1 tablet by mouth daily.      . multivitamin (THERAGRAN) per tablet Take 1 tablet by mouth daily.      . nitroGLYCERIN (NITROSTAT) 0.4 MG SL tablet Place 1 tablet (0.4 mg total) under the tongue every 5 (five) minutes as needed for chest pain. 25 tablet 5  . omeprazole (PRILOSEC) 20 MG capsule Take 20 mg by mouth daily.     . Marland KitchenxyCODONE-acetaminophen (PERCOCET/ROXICET) 5-325 MG per tablet Take 1 tablet by mouth every 4 (four) hours as needed for severe pain. 30 tablet 0  . prochlorperazine (COMPAZINE) 10 MG tablet Take 1 tablet (10 mg total) by mouth every 6 (six) hours as needed for nausea or vomiting. 30 tablet 1  . sucralfate (CARAFATE) 1 G tablet Dissolve 1 tablet in 10 mL H20 and swallow up to QID, in between meals, to soothe esophagus. 60 tablet 1  . Wound Cleansers (RADIAPLEX EX) Apply topically.     No current facility-administered medications for this visit.    SURGICAL HISTORY:  Past Surgical History  Procedure Laterality Date  . Cardiac catheterization  11/26/2006    Est. EF of 40% --  A 100% obstructive proximal/mid left anterior descending artery consistent with recent anterior myocardial infarction --  Moderate mid right coronary artery disease --Mild left ventricular dysfunction with EF of 40%  with distal anterior akinesis and periapical dyskinesis    -- TeKaylyn Lim M.D.  . Colonoscopy    . Video bronchoscopy with endobronchial ultrasound N/A 01/11/2015    Procedure: VIDEO BRONCHOSCOPY WITH ENDOBRONCHIAL ULTRASOUND;  Surgeon: EdGrace IsaacMD;  [facility]: Thor[facility];  Laterality: N/A;  . Lung biopsy N/A 01/11/2015    Procedure: LUNG BIOPSY;  Surgeon: EdGrace IsaacMD;  [facility];  Laterality: N/A;    REVIEW OF SYSTEMS:   Constitutional: negative Eyes: negative Ears, nose, mouth, throat, and face: positive for hoarseness Respiratory: positive for dyspnea on exertion Cardiovascular: negative Gastrointestinal: positive for constipation Genitourinary:positive for decreased stream and frequency Integument/breast: negative Hematologic/lymphatic: negative Musculoskeletal:negative Neurological: negative Behavioral/Psych: negative Endocrine: negative Allergic/Immunologic: negative   PHYSICAL EXAMINATION: General appearance: alert, cooperative, fatigued and no distress Head: Normocephalic, without obvious abnormality, atraumatic Neck: no adenopathy, no JVD, supple, symmetrical, trachea midline and thyroid not enlarged, symmetric, no tenderness/mass/nodules Lymph nodes: Cervical, supraclavicular, and axillary nodes normal. Resp: clear to auscultation bilaterally Back: symmetric, no curvature. ROM normal. No CVA tenderness. Cardio: regular rate and rhythm, S1, S2 normal, no murmur, click, rub or gallop GI: soft, non-tender; bowel sounds normal; no masses,  no organomegaly Extremities: extremities normal, atraumatic, no cyanosis or edema Neurologic: Alert and oriented X 3, normal strength and  tone. Normal symmetric reflexes. Normal coordination and gait  ECOG PERFORMANCE STATUS: 1 - Symptomatic but completely ambulatory  Blood pressure 124/89, pulse 98, temperature 98 F (36.7 C), temperature source Oral, resp. rate 17, height '5\' 9"'$  (1.753 m), weight 176 lb 4.8 oz (79.969 kg), SpO2 98 %.  LABORATORY DATA: Lab Results  Component Value Date   WBC 3.3* 05/20/2015   HGB 7.8* 05/20/2015   HCT 23.5* 05/20/2015   MCV 99.3* 05/20/2015   PLT 123* 05/20/2015      Chemistry      Component Value Date/Time   NA 137 05/20/2015 1113   NA 142 01/11/2015 0726   K 3.5 05/20/2015 1113   K 3.6 01/11/2015 0726   CL 105 01/11/2015 0726   CO2 25 05/20/2015 1113   CO2 32 01/11/2015 0726   BUN 11.0 05/20/2015 1113    BUN 10 01/11/2015 0726   CREATININE 1.0 05/20/2015 1113   CREATININE 0.93 01/11/2015 0726   CREATININE 0.88 12/29/2014 1207      Component Value Date/Time   CALCIUM 8.7 05/20/2015 1113   CALCIUM 8.9 01/11/2015 0726   ALKPHOS 101 05/20/2015 1113   ALKPHOS 106 01/11/2015 0726   AST 35* 05/20/2015 1113   AST 26 01/11/2015 0726   ALT 14 05/20/2015 1113   ALT 14 01/11/2015 0726   BILITOT 0.78 05/20/2015 1113   BILITOT 1.1 01/11/2015 0726       RADIOGRAPHIC STUDIES: Mr [name] Wo Contrast  05-08-15   CLINICAL DATA:  SRS restaging.  Metastatic lung cancer.  EXAM: MRI HEAD WITHOUT AND WITH CONTRAST  TECHNIQUE: Multiplanar, multiecho pulse sequences of the brain and surrounding structures were obtained without and with intravenous contrast.  CONTRAST:  89m MULTIHANCE GADOBENATE DIMEGLUMINE 529 MG/ML IV SOLN  COMPARISON:  None.  FINDINGS: Positive response to targeted radiotherapy of the previously identified RIGHT posterior temporal periventricular solitary brain metastasis. Cross-sectional measurements of the lesion on image 16 series 10 are 3 x 8 mm. No significant surrounding edema. Diminished and minor diffusion restriction.  No new lesions are evident. Significant generalized atrophy. No acute stroke, hemorrhage, or focal mass lesion. No midline shift. Flow voids remain patent. Cervical spondylosis. Mild chronic sinus disease.  IMPRESSION: Improved appearance status post targeted Radiotherapy to the previous identified RIGHT posterior temporal periventricular metastasis. No new findings are evident.   Electronically Signed   By: JRolla FlattenM.D.   On: 005/25/201614:23    ASSESSMENT AND PLAN: This is a very pleasant 81years old white male recently diagnosed with stage IV non-small cell lung cancer, poorly differentiated adenocarcinoma. He has a single 871mring like enhancing lesion bordering the posterior aspect of the right temporal horn of the right lateral ventricle. He is status post SRS  treatment to the single brain lesion. He is currently undergoing palliative radiotherapy to the left upper lobe and hilar mass. Last fraction of radiation therapy expected 03/27/2015. He is status post 3 cycles of systemic chemotherapy with carboplatin and Alimta and overall tolerated this chemotherapy relatively well except for chemotherapy-induced anemia. He again has chemotherapy-induced anemia with a hemoglobin of 7.8. I will arrange to transfuse him a total of 2 units of packed red blood cells. He continues to have some constipation. I have again recommended that he take merely asked in the morning and 2 Senokot S tablets in the evening.  His last CT scan of the chest showed positive response to therapy with decreased size of the left upper lobe mass and  associated left-sided mediastinal/hilar lymphadenopathy. He will proceed with cycle #6 today as scheduled at the rate use dose of carboplatin to an AUC of 4 and the Alimta to 400 mg/m given the effect of chemotherapy on his hemoglobin. [place] him sleep he may try either Benadryl or the zzquil. Both the patient and his wife voiced understanding. He will continue weekly labs as scheduled and follow-up in 3 weeks with a restaging CT scan of the chest with contrast to reevaluate his disease. Pending the results of the restaging CT scan he may proceed with maintenance chemotherapy with single agent Alimta versus taking a chemotherapy break.  He was advised to call immediately if he has any concerning symptoms in the interval. The patient voices understanding of current disease status and treatment options and is in agreement with the current care plan.  All questions were answered. The patient knows to call the clinic with any problems, questions or concerns. We can certainly see the patient much sooner if necessary.  Carlton Adam, PA-C 05/20/2015

## 2015-05-20 NOTE — Patient Instructions (Signed)
Your hemoglobin is low and you will receive a total of 2 units of blood to improve your level of anemia Continue labs as previously scheduled Follow-up in 3 weeks with a restaging CT scan of your chest to reevaluate your disease

## 2015-05-20 NOTE — Patient Instructions (Addendum)
[facility] Discharge Instructions for Patients Receiving Chemotherapy  Today you received the following chemotherapy agents: Alimta and Carboplatin.  To help prevent nausea and vomiting after your treatment, we encourage you to take your nausea medication: Compazine. Take one every 6 hours as needed.   If you develop nausea and vomiting that is not controlled by your nausea medication, call the clinic.   BELOW ARE SYMPTOMS THAT SHOULD BE REPORTED IMMEDIATELY:  *FEVER GREATER THAN 100.5 F  *CHILLS WITH OR WITHOUT FEVER  NAUSEA AND VOMITING THAT IS NOT CONTROLLED WITH YOUR NAUSEA MEDICATION  *UNUSUAL SHORTNESS OF BREATH  *UNUSUAL BRUISING OR BLEEDING  TENDERNESS IN MOUTH AND THROAT WITH OR WITHOUT PRESENCE OF ULCERS  *URINARY PROBLEMS  *BOWEL PROBLEMS  UNUSUAL RASH Items with * indicate a potential emergency and should be followed up as soon as possible.  Feel free to call the clinic should you have any questions or concerns. The clinic phone number is [phone].  Please show the [facility] at check-in to the Emergency Department and triage nurse.  Blood Transfusion Information WHAT IS A BLOOD TRANSFUSION? A transfusion is the replacement of blood or some of its parts. Blood is made up of multiple cells which provide different functions.  Red blood cells carry oxygen and are used for blood loss replacement.  White blood cells fight against infection.  Platelets control bleeding.  Plasma helps clot blood.  Other blood products are available for specialized needs, such as hemophilia or other clotting disorders. BEFORE THE TRANSFUSION  Who gives blood for transfusions?   You may be able to donate blood to be used at a later date on yourself (autologous donation).  Relatives can be asked to donate blood. This is generally not any safer than if you have received blood from a stranger. The same precautions are taken to ensure safety when a  relative's blood is donated.  Healthy volunteers who are fully evaluated to make sure their blood is safe. This is blood bank blood. Transfusion therapy is the safest it has ever been in the practice of medicine. Before blood is taken from a donor, a complete history is taken to make sure that person has no history of diseases nor engages in risky social behavior (examples are intravenous drug use or sexual activity with multiple partners). The donor's travel history is screened to minimize risk of transmitting infections, such as malaria. The donated blood is tested for signs of infectious diseases, such as HIV and hepatitis. The blood is then tested to be sure it is compatible with you in order to minimize the chance of a transfusion reaction. If you or a relative donates blood, this is often done in anticipation of surgery and is not appropriate for emergency situations. It takes many days to process the donated blood. RISKS AND COMPLICATIONS Although transfusion therapy is very safe and saves many lives, the main dangers of transfusion include:   Getting an infectious disease.  Developing a transfusion reaction. This is an allergic reaction to something in the blood you were given. Every precaution is taken to prevent this. The decision to have a blood transfusion has been considered carefully by your caregiver before blood is given. Blood is not given unless the benefits outweigh the risks. AFTER THE TRANSFUSION  Right after receiving a blood transfusion, you will usually feel much better and more energetic. This is especially true if your red blood cells have gotten low (anemic). The transfusion raises the level of  the red blood cells which carry oxygen, and this usually causes an energy increase.  The nurse administering the transfusion will monitor you carefully for complications. HOME CARE INSTRUCTIONS  No special instructions are needed after a transfusion. You may find your energy is  better. Speak with your caregiver about any limitations on activity for underlying diseases you may have. SEEK MEDICAL CARE IF:   Your condition is not improving after your transfusion.  You develop redness or irritation at the intravenous (IV) site. SEEK IMMEDIATE MEDICAL CARE IF:  Any of the following symptoms occur over the next 12 hours:  Shaking chills.  You have a temperature by mouth above 102 F (38.9 C), not controlled by medicine.  Chest, back, or muscle pain.  People around you feel you are not acting correctly or are confused.  Shortness of breath or difficulty breathing.  Dizziness and fainting.  You get a rash or develop hives.  You have a decrease in urine output.  Your urine turns a dark color or changes to pink, red, or brown. Any of the following symptoms occur over the next 10 days:  You have a temperature by mouth above 102 F (38.9 C), not controlled by medicine.  Shortness of breath.  Weakness after normal activity.  The white part of the eye turns yellow (jaundice).  You have a decrease in the amount of urine or are urinating less often.  Your urine turns a dark color or changes to pink, red, or brown. Document Released: 11/27/2000 Document Revised: 02/22/2012 Document Reviewed: 07/16/2008 [facility] Patient Information 2015 [place], [place]. This information is not intended to replace advice given to you by your health care provider. Make sure you discuss any questions you have with your health care provider.

## 2015-05-21 ENCOUNTER — Other Ambulatory Visit: Payer: Self-pay | Admitting: Medical Oncology

## 2015-05-21 ENCOUNTER — Ambulatory Visit (HOSPITAL_BASED_OUTPATIENT_CLINIC_OR_DEPARTMENT_OTHER): Payer: Medicare Other

## 2015-05-21 VITALS — BP 122/52 | HR 61 | Temp 97.8°F | Resp 18

## 2015-05-21 DIAGNOSIS — T451X5A Adverse effect of antineoplastic and immunosuppressive drugs, initial encounter: Principal | ICD-10-CM

## 2015-05-21 DIAGNOSIS — D6481 Anemia due to antineoplastic chemotherapy: Secondary | ICD-10-CM | POA: Diagnosis present

## 2015-05-21 MED ORDER — ACETAMINOPHEN 325 MG PO TABS
ORAL_TABLET | ORAL | Status: AC
  Filled 2015-05-21: qty 2

## 2015-05-21 MED ORDER — ACETAMINOPHEN 325 MG PO TABS
650.0000 mg | ORAL_TABLET | Freq: Once | ORAL | Status: AC
  Administered 2015-05-21: 650 mg via ORAL

## 2015-05-21 MED ORDER — SODIUM CHLORIDE 0.9 % IV SOLN
250.0000 mL | Freq: Once | INTRAVENOUS | Status: AC
  Administered 2015-05-21: 250 mL via INTRAVENOUS

## 2015-05-21 MED ORDER — DIPHENHYDRAMINE HCL 25 MG PO CAPS
ORAL_CAPSULE | ORAL | Status: AC
  Filled 2015-05-21: qty 1

## 2015-05-21 MED ORDER — DIPHENHYDRAMINE HCL 25 MG PO CAPS
25.0000 mg | ORAL_CAPSULE | Freq: Once | ORAL | Status: AC
  Administered 2015-05-21: 25 mg via ORAL

## 2015-05-21 NOTE — Patient Instructions (Signed)

## 2015-05-22 LAB — TYPE AND SCREEN
ABO/RH(D): A POS
ANTIBODY SCREEN: NEGATIVE
Unit division: 0
Unit division: 0

## 2015-05-24 ENCOUNTER — Telehealth: Payer: Self-pay | Admitting: *Deleted

## 2015-05-24 NOTE — Telephone Encounter (Signed)
VM message received  @ 9:24 am. Call back to patient, spoke with pt. He states his throat started getting sore yesterday and is worse this morning. Also he is having some diarrhea-about 5-6 times today. Denies fever, nausea or vomiting. States despite his sore throat that he is drinking 'lots' of fluid. Advised to get Imodium and take as directed (2 tabs now and then 1 after each loose stool but no more than 6 in one day); advised to increase fluids to at least 64 oz/day. Advised to use his magic mouthwash 4 x a day and to gargle as needed with warm salt water. Pt wrote instructions and was able to repeat back to this writer, the instructions given.  Also advised pt to call back if symptoms do not improve over the next 24 hours and to go to ED if worsens over the weekend, esp if develop fever >100.5 and ongoing diarrhea.  Pt verbalized understanding.

## 2015-05-30 ENCOUNTER — Other Ambulatory Visit: Payer: Self-pay | Admitting: *Deleted

## 2015-05-30 DIAGNOSIS — C3491 Malignant neoplasm of unspecified part of right bronchus or lung: Secondary | ICD-10-CM

## 2015-05-30 MED ORDER — FOLIC ACID 1 MG PO TABS: 1.0000 mg | ORAL_TABLET | Freq: Every day | ORAL | Status: AC

## 2015-06-07 ENCOUNTER — Ambulatory Visit (HOSPITAL_COMMUNITY)
Admission: RE | Admit: 2015-06-07 | Discharge: 2015-06-07 | Disposition: A | Discharge status: Home / Self Care | Payer: Medicare Other | Source: Ambulatory Visit | Attending: Physician Assistant | Admitting: Physician Assistant

## 2015-06-07 DIAGNOSIS — Z08 Encounter for follow-up examination after completed treatment for malignant neoplasm: Secondary | ICD-10-CM | POA: Insufficient documentation

## 2015-06-07 DIAGNOSIS — C3490 Malignant neoplasm of unspecified part of unspecified bronchus or lung: Secondary | ICD-10-CM | POA: Insufficient documentation

## 2015-06-07 DIAGNOSIS — M899 Disorder of bone, unspecified: Secondary | ICD-10-CM | POA: Insufficient documentation

## 2015-06-07 DIAGNOSIS — R911 Solitary pulmonary nodule: Secondary | ICD-10-CM | POA: Insufficient documentation

## 2015-06-07 DIAGNOSIS — D739 Disease of spleen, unspecified: Secondary | ICD-10-CM | POA: Diagnosis not present

## 2015-06-07 DIAGNOSIS — C3492 Malignant neoplasm of unspecified part of left bronchus or lung: Secondary | ICD-10-CM

## 2015-06-07 MED ORDER — IOHEXOL 300 MG/ML  SOLN
80.0000 mL | Freq: Once | INTRAMUSCULAR | Status: AC | PRN
  Administered 2015-06-07: 80 mL via INTRAVENOUS

## 2015-06-10 ENCOUNTER — Other Ambulatory Visit (HOSPITAL_BASED_OUTPATIENT_CLINIC_OR_DEPARTMENT_OTHER): Payer: Medicare Other

## 2015-06-10 ENCOUNTER — Telehealth: Payer: Self-pay | Admitting: Physician Assistant

## 2015-06-10 ENCOUNTER — Encounter: Payer: Self-pay | Admitting: Physician Assistant

## 2015-06-10 ENCOUNTER — Ambulatory Visit (HOSPITAL_BASED_OUTPATIENT_CLINIC_OR_DEPARTMENT_OTHER): Payer: Medicare Other | Admitting: Physician Assistant

## 2015-06-10 ENCOUNTER — Ambulatory Visit: Payer: Medicare Other

## 2015-06-10 VITALS — BP 120/61 | HR 100 | Temp 98.9°F | Resp 18 | Ht 69.0 in | Wt 175.0 lb

## 2015-06-10 DIAGNOSIS — D6481 Anemia due to antineoplastic chemotherapy: Secondary | ICD-10-CM | POA: Diagnosis not present

## 2015-06-10 DIAGNOSIS — K5900 Constipation, unspecified: Secondary | ICD-10-CM | POA: Diagnosis not present

## 2015-06-10 DIAGNOSIS — C7931 Secondary malignant neoplasm of brain: Secondary | ICD-10-CM

## 2015-06-10 DIAGNOSIS — C3412 Malignant neoplasm of upper lobe, left bronchus or lung: Secondary | ICD-10-CM | POA: Diagnosis present

## 2015-06-10 DIAGNOSIS — R5383 Other fatigue: Secondary | ICD-10-CM | POA: Diagnosis not present

## 2015-06-10 DIAGNOSIS — C3492 Malignant neoplasm of unspecified part of left bronchus or lung: Secondary | ICD-10-CM

## 2015-06-10 LAB — COMPREHENSIVE METABOLIC PANEL (CC13)
ALK PHOS: 93 U/L (ref 40–150)
ALT: 15 U/L (ref 0–55)
AST: 32 U/L (ref 5–34)
Albumin: 3.3 g/dL — ABNORMAL LOW (ref 3.5–5.0)
Anion Gap: 9 mEq/L (ref 3–11)
BILIRUBIN TOTAL: 0.71 mg/dL (ref 0.20–1.20)
BUN: 12.5 mg/dL (ref 7.0–26.0)
CO2: 27 meq/L (ref 22–29)
Calcium: 9 mg/dL (ref 8.4–10.4)
Chloride: 102 mEq/L (ref 98–109)
Creatinine: 1 mg/dL (ref 0.7–1.3)
EGFR: 72 mL/min/{1.73_m2} — ABNORMAL LOW (ref >90)
GLUCOSE: 109 mg/dL (ref 70–140)
Potassium: 3.2 mEq/L — ABNORMAL LOW (ref 3.5–5.1)
Sodium: 138 mEq/L (ref 136–145)
Total Protein: 6.2 g/dL — ABNORMAL LOW (ref 6.4–8.3)

## 2015-06-10 LAB — CBC WITH DIFFERENTIAL/PLATELET
BASO%: 0.2 % (ref 0.0–2.0)
BASOS ABS: 0 10*3/uL (ref 0.0–0.1)
EOS%: 0 % (ref 0.0–7.0)
Eosinophils Absolute: 0 10*3/uL (ref 0.0–0.5)
HCT: 27.4 % — ABNORMAL LOW (ref 38.4–49.9)
HEMOGLOBIN: 9.1 g/dL — AB (ref 13.0–17.1)
LYMPH%: 4.8 % — ABNORMAL LOW (ref 14.0–49.0)
MCH: 32.5 pg (ref 27.2–33.4)
MCHC: 33.3 g/dL (ref 32.0–36.0)
MCV: 97.6 fL (ref 79.3–98.0)
MONO#: 0.7 10*3/uL (ref 0.1–0.9)
MONO%: 13.1 % (ref 0.0–14.0)
NEUT#: 4.1 10*3/uL (ref 1.5–6.5)
NEUT%: 81.9 % — AB (ref 39.0–75.0)
PLATELETS: 190 10*3/uL (ref 140–400)
RBC: 2.81 10*6/uL — ABNORMAL LOW (ref 4.20–5.82)
RDW: 21.3 % — ABNORMAL HIGH (ref 11.0–14.6)
WBC: 5 10*3/uL (ref 4.0–10.3)
lymph#: 0.2 10*3/uL — ABNORMAL LOW (ref 0.9–3.3)

## 2015-06-10 LAB — HOLD TUBE, BLOOD BANK

## 2015-06-10 NOTE — Telephone Encounter (Signed)
Appointments made and avs pritned for patient °

## 2015-06-10 NOTE — Progress Notes (Addendum)
[place] [phone]   Fax:([phone]) [place], [place] [address]  DIAGNOSIS: Metastatic non-small cell lung cancer, poorly differentiated adenocarcinoma diagnosed in January 2016.   PRIOR THERAPY:  1. Status post SRS treatment to single brain lesion under the care of Dr. Tammi Klippel.  2. Palliative radiation therapy under the care of the [facility]. Last fraction of radiation therapy expected 03/27/2015.  CURRENT THERAPY:  Systemic chemotherapy with carboplatin for an AUC of 5 and Alimta at 500 mg/m given every 3 weeks. Status post 6 cycles. Starting with cycle #5 the carboplatin is for an AUC of 4 and the Alimta at 400 mg/m    INTERVAL HISTORY: Luke Contreras 81 y.o. male returns to the clinic today for follow-up visit accompanied by his wife. The patient is feeling fine today with no specific complaints except for the baseline shortness of breath in addition to hoarseness of his voice. He reports some issues with constipation but is again not taking stool softeners as previously recommended.  He tolerated his chemotherapy relatively well.  He denied having any significant weight loss or night sweats. He has no chest pain but continues to have shortness breath with exertion with mild cough with no hemoptysis. The patient denied having any current significant nausea or vomiting. He recently had a restaging CT scan of the chest and presents to discuss the results.  MEDICAL HISTORY: Past Medical History  Diagnosis Date  . History of acute anterior wall myocardial infarction 11/2006  . Atrial fibrillation   . Dyslipidemia   . Hypertension   . Coronary artery disease     history of  obstructive single-valve disease  . PAC (premature atrial contraction)   . Left ventricular dysfunction     withEF of 40-45%   . Complication of anesthesia   . Shortness of breath dyspnea     " when talking and  completing a long sentence  . GERD (gastroesophageal reflux disease)   . Arthritis     hands, knees  . Cancer     skin cancer- side of nausea    ALLERGIES:  is allergic to cephalexin and aspirin.  MEDICATIONS:  Current Outpatient Prescriptions  Medication Sig Dispense Refill  . alum & mag hydroxide-simeth (MAALOX/MYLANTA) 200-200-20 MG/5ML suspension Take by mouth every 6 (six) hours as needed for indigestion or heartburn.    . Alum & Mag Hydroxide-Simeth (MAGIC MOUTHWASH W/LIDOCAINE) SOLN Take 5 mLs by mouth 4 (four) times daily as needed for mouth pain ([facility] mouthwash formula w/ lidocaine 1:1.). 240 mL 1  . atorvastatin (LIPITOR) 40 MG tablet TAKE 1/2 TABLET (= 20 MG   TOTAL) DAILY 45 tablet 2  . clorazepate (TRANXENE) 7.5 MG tablet Take 7.5 mg by mouth at bedtime.      . Cyanocobalamin (VITAMIN B-12 PO) Take 500 mcg by mouth daily.     Marland Kitchen dexamethasone (DECADRON) 4 MG tablet Take 1 tablet by mouth twice daily with food the day before, the day of and the day after chemotherapy 30 tablet 1  . Dextromethorphan-Guaifenesin (CORICIDIN HBP CONGESTION/COUGH PO) Take by mouth daily as needed.    Marland Kitchen emollient (BIAFINE) cream Apply topically as needed.    . fluorouracil (EFUDEX) 5 % cream Apply 1 application topically daily.   0  . folic acid (FOLVITE) 1 MG tablet Take 1 tablet (1 mg total) by mouth daily. 30 tablet 3  . lidocaine (XYLOCAINE)  2 % solution Patient: Mix 1part 2% viscous lidocaine, 1part H20. Swish and/or swallow 71m of this mixture, 36m before meals and at bedtime, up to QID 100 mL 1  . lisinopril (PRINIVIL,ZESTRIL) 2.5 MG tablet Take 1 tablet (2.5 mg total) by mouth daily. 90 tablet 0  . Misc Natural Products (OSTEO BI-FLEX JOINT SHIELD PO) Take 1 tablet by mouth daily.      . multivitamin (THERAGRAN) per tablet Take 1 tablet by mouth daily.      . nitroGLYCERIN (NITROSTAT) 0.4 MG SL tablet Place 1 tablet (0.4 mg total) under the tongue every 5 (five) minutes as needed for  chest pain. 25 tablet 5  . omeprazole (PRILOSEC) 20 MG capsule Take 20 mg by mouth daily.     . Marland KitchenxyCODONE-acetaminophen (PERCOCET/ROXICET) 5-325 MG per tablet Take 1 tablet by mouth every 4 (four) hours as needed for severe pain. 30 tablet 0  . prochlorperazine (COMPAZINE) 10 MG tablet Take 1 tablet (10 mg total) by mouth every 6 (six) hours as needed for nausea or vomiting. 30 tablet 1  . sucralfate (CARAFATE) 1 G tablet Dissolve 1 tablet in 10 mL H20 and swallow up to QID, in between meals, to soothe esophagus. 60 tablet 1  . Wound Cleansers (RADIAPLEX EX) Apply topically.     No current facility-administered medications for this visit.    SURGICAL HISTORY:  Past Surgical History  Procedure Laterality Date  . Cardiac catheterization  11/26/2006    Est. EF of 40% --  A 100% obstructive proximal/mid left anterior descending artery consistent with recent anterior myocardial infarction --  Moderate mid right coronary artery disease --Mild left ventricular dysfunction with EF of 40%  with distal anterior akinesis and periapical dyskinesis    -- TeKaylyn Lim M.D.  . Colonoscopy    . Video bronchoscopy with endobronchial ultrasound N/A 01/11/2015    Procedure: VIDEO BRONCHOSCOPY WITH ENDOBRONCHIAL ULTRASOUND;  Surgeon: EdGrace IsaacMD;  [facility];  Laterality: N/A;  . Lung biopsy N/A 01/11/2015    Procedure: LUNG BIOPSY;  Surgeon: EdGrace IsaacMD;  [facility];  Laterality: N/A;    REVIEW OF SYSTEMS:  Constitutional: negative Eyes: negative Ears, nose, mouth, throat, and face: positive for hoarseness Respiratory: positive for dyspnea on exertion Cardiovascular: negative Gastrointestinal: positive for constipation Genitourinary:negative Integument/breast: negative Hematologic/lymphatic: negative Musculoskeletal:negative Neurological: negative Behavioral/Psych: negative Endocrine: negative Allergic/Immunologic: negative     PHYSICAL EXAMINATION: General appearance: alert, cooperative, fatigued and no distress Head: Normocephalic, without obvious abnormality, atraumatic Neck: no adenopathy, no JVD, supple, symmetrical, trachea midline and thyroid not enlarged, symmetric, no tenderness/mass/nodules Lymph nodes: Cervical, supraclavicular, and axillary nodes normal. Resp: clear to auscultation bilaterally Back: symmetric, no curvature. ROM normal. No CVA tenderness. Cardio: regular rate and rhythm, S1, S2 normal, no murmur, click, rub or gallop GI: soft, non-tender; bowel sounds normal; no masses,  no organomegaly Extremities: extremities normal, atraumatic, no cyanosis or edema Neurologic: Alert and oriented X 3, normal strength and tone. Normal symmetric reflexes. Normal coordination and gait  ECOG PERFORMANCE STATUS: 1 - Symptomatic but completely ambulatory  Blood pressure 120/61, pulse 100, temperature 98.9 F (37.2 C), temperature source Oral, resp. rate 18, height '5\' 9"'$  (1.753 m), weight 175 lb (79.379 kg), SpO2 99 %.  LABORATORY DATA: Lab Results  Component Value Date   WBC 5.0 06/10/2015   HGB 9.1* 06/10/2015   HCT 27.4* 06/10/2015   MCV 97.6 06/10/2015   PLT 190  06/10/2015      Chemistry      Component Value Date/Time   NA 138 06/10/2015 1113   NA 142 01/11/2015 0726   K 3.2* 06/10/2015 1113   K 3.6 01/11/2015 0726   CL 105 01/11/2015 0726   CO2 27 06/10/2015 1113   CO2 32 01/11/2015 0726   BUN 12.5 06/10/2015 1113   BUN 10 01/11/2015 0726   CREATININE 1.0 06/10/2015 1113   CREATININE 0.93 01/11/2015 0726   CREATININE 0.88 12/29/2014 1207      Component Value Date/Time   CALCIUM 9.0 06/10/2015 1113   CALCIUM 8.9 01/11/2015 0726   ALKPHOS 93 06/10/2015 1113   ALKPHOS 106 01/11/2015 0726   AST 32 06/10/2015 1113   AST 26 01/11/2015 0726   ALT 15 06/10/2015 1113   ALT 14 01/11/2015 0726   BILITOT 0.71 06/10/2015 1113   BILITOT 1.1 01/11/2015 0726       RADIOGRAPHIC  STUDIES: Ct Chest W Contrast  06/07/2015   CLINICAL DATA:  Restaging lung cancer  EXAM: CT CHEST WITH CONTRAST  TECHNIQUE: Multidetector CT imaging of the chest was performed during intravenous contrast administration.  CONTRAST:  71m OMNIPAQUE IOHEXOL 300 MG/ML  SOLN  COMPARISON:  04/05/2015 and 01/02/2015  FINDINGS: Chest wall: No chest wall mass, supraclavicular or axillary lymphadenopathy. The thyroid gland appears normal. The bony thorax is intact. Stable lesion involving the right first rib. No new bone lesions or spinal canal compromise.  Mediastinum: The heart is normal in size and stable. No pericardial effusion. Stable tortuosity, ectasia and calcification of the thoracic aorta. Three-vessel coronary artery calcifications are again noted. The esophagus is grossly normal.  The left upper lobe/ paramediastinal/mediastinal mass measures 4.0 by 3.6 cm and previously measured 4.5 x 3.9 cm. Stable surrounding drowned/ obstructed lung in the left upper lobe.  Lungs/ pleura:  Stable emphysematous changes.  Right upper lobe pulmonary nodule on image number 13 measures 7 mm and previously measured 6 mm.  Subpleural pulmonary nodule in the right lower lobe on image number 24 measures 6 mm and previously measured 6 mm.  New 4 mm subpleural nodule in the right lower lobe on image number 43.  3 mm pulmonary nodule at the left lung base best seen on the sagittal images, number 95.  No pleural effusion.  Upper abdomen: Stable elevation of the left hemidiaphragm. New 7 mm subdiaphragmatic lymph node on the left side. No adrenal gland lesions. Stable left renal cyst. Stable lipoma in the right colon. Stable 13 mm splenic lesion. No hepatic lesion is identified. Stable cholelithiasis.  IMPRESSION: 1. Interval decrease in size of the left mediastinal/paramediastinal mass as described above. 2. Two stable right lung nodules and 1 new 4 mm nodule at the right lung base. There is also a new 3 mm nodule at the left lung base.  3. No liver lesion is identified.  Stable 13 mm splenic lesion. 4. Stable right first rib lesion.  No new bone lesions.   Electronically Signed   By: PMarijo SanesM.D.   On: 06/07/2015 11:36    ASSESSMENT AND PLAN: This is a very pleasant 81years old white male recently diagnosed with stage IV non-small cell lung cancer, poorly differentiated adenocarcinoma. He has a single 856mring like enhancing lesion bordering the posterior aspect of the right temporal horn of the right lateral ventricle. He is status post SRS treatment to the single brain lesion. He completed a course of palliative radiotherapy to the left upper  lobe and hilar mass. Last fraction of radiation therapy expected 03/27/2015. He is status post 6 cycles of systemic chemotherapy with carboplatin and Alimta and overall tolerated this chemotherapy relatively well except for chemotherapy-induced anemia. He again has chemotherapy-induced anemia with a hemoglobin of 7.8. I will arrange to transfuse him a total of 2 units of packed red blood cells. He continues to have some constipation. I have again recommended that he take MiraLax in the morning and 2 Senokot S tablets in the evening.  His most recent CT scan revealed a mixed response with some reduction in size of the left mediastinal paramediastinal mass with 2 stable right lung nodules there is one new 4 mm nodule in the right lung base and a new 3 mm nodule in the left lung base. Patient was discussed with and also seen by Dr. Julien Nordmann. As these new lesions are tiny we will place Mr. Lowdermilk on observation for the next 2 months. He will follow-up in 2 months with a restaging CT scan of the chest with contrast to reevaluate his disease. Should these nodules become larger we will discuss treatment options including chemotherapy or perhaps some targeted radiotherapy to these nodules.   He was advised to call immediately if he has any concerning symptoms in the interval. The patient voices  understanding of current disease status and treatment options and is in agreement with the current care plan.  All questions were answered. The patient knows to call the clinic with any problems, questions or concerns. We can certainly see the patient much sooner if necessary.  Carlton Adam, PA-C 06/10/2015  ADDENDUM: Hematology/Oncology Attending:  I had a face to face encounter with the patient. I recommended his care plan. This is a very pleasant 81 years old white male with metastatic non-small cell lung cancer, adenocarcinoma status post 6 cycles of systemic chemotherapy with carboplatin and Alimta.  He tolerated the last cycle of his treatment fairly well with no significant adverse effect except for increasing fatigue. Therapy induced anemia and we will arrange for him to receive 2 units of PRBCs transfusion.  His recent CT scan of the chest, abdomen and pelvis showed decrease in the size of the left mediastinal/paramediastinal mass but there was too small nodules in the right lung and small 3 mm nodule at the left lung base.  Result with the patient and his wife. I gave him the option of observation and close monitoring versus consideration of maintenance chemotherapy with single agent Alimta.  The patient is complaining of increasing fatigue and he would like to take a break off chemotherapy.  I will arrange for him to come back for follow-up visit in 2 months for reevaluation after repeating CT scan of the chest, abdomen and pelvis for restaging of his disease.  The patient was advised to call immediately if he has any concerning symptoms in the interval.  Disclaimer: This note was dictated with voice recognition software. Similar sounding words can inadvertently be transcribed and may be missed upon review. Eilleen Kempf., MD 06/12/2015

## 2015-06-10 NOTE — Patient Instructions (Signed)
Your recent restaging CT scan of the chest showed mixed response with overall decreased/stable disease however, 2 new tiny nodules (1 at the base of each lung) that we will continue to monitor closely. Follow-up in 2 months with a restaging CT scan of the chest to reevaluate your disease

## 2015-06-18 ENCOUNTER — Telehealth: Payer: Self-pay

## 2015-06-18 ENCOUNTER — Ambulatory Visit (HOSPITAL_BASED_OUTPATIENT_CLINIC_OR_DEPARTMENT_OTHER): Payer: Medicare Other

## 2015-06-18 ENCOUNTER — Ambulatory Visit (HOSPITAL_COMMUNITY)
Admission: RE | Admit: 2015-06-18 | Discharge: 2015-06-18 | Disposition: A | Discharge status: Home / Self Care | Payer: Medicare Other | Source: Ambulatory Visit | Attending: Nurse Practitioner | Admitting: Nurse Practitioner

## 2015-06-18 ENCOUNTER — Ambulatory Visit (HOSPITAL_BASED_OUTPATIENT_CLINIC_OR_DEPARTMENT_OTHER): Payer: Medicare Other | Admitting: Nurse Practitioner

## 2015-06-18 ENCOUNTER — Other Ambulatory Visit: Payer: Self-pay

## 2015-06-18 ENCOUNTER — Telehealth: Payer: Self-pay | Admitting: *Deleted

## 2015-06-18 ENCOUNTER — Other Ambulatory Visit: Payer: Self-pay | Admitting: *Deleted

## 2015-06-18 VITALS — BP 109/61 | HR 115 | Temp 99.1°F | Resp 16 | Wt 169.3 lb

## 2015-06-18 DIAGNOSIS — R300 Dysuria: Secondary | ICD-10-CM

## 2015-06-18 DIAGNOSIS — E860 Dehydration: Secondary | ICD-10-CM

## 2015-06-18 DIAGNOSIS — R530 Neoplastic (malignant) related fatigue: Secondary | ICD-10-CM | POA: Diagnosis not present

## 2015-06-18 DIAGNOSIS — C3492 Malignant neoplasm of unspecified part of left bronchus or lung: Secondary | ICD-10-CM

## 2015-06-18 DIAGNOSIS — J40 Bronchitis, not specified as acute or chronic: Secondary | ICD-10-CM

## 2015-06-18 DIAGNOSIS — E8809 Other disorders of plasma-protein metabolism, not elsewhere classified: Secondary | ICD-10-CM | POA: Diagnosis not present

## 2015-06-18 DIAGNOSIS — E876 Hypokalemia: Secondary | ICD-10-CM | POA: Diagnosis not present

## 2015-06-18 DIAGNOSIS — J9819 Other pulmonary collapse: Secondary | ICD-10-CM | POA: Diagnosis not present

## 2015-06-18 DIAGNOSIS — R05 Cough: Secondary | ICD-10-CM | POA: Diagnosis present

## 2015-06-18 DIAGNOSIS — R918 Other nonspecific abnormal finding of lung field: Secondary | ICD-10-CM | POA: Insufficient documentation

## 2015-06-18 LAB — COMPREHENSIVE METABOLIC PANEL (CC13)
ALK PHOS: 99 U/L (ref 40–150)
ALT: 16 U/L (ref 0–55)
AST: 35 U/L — AB (ref 5–34)
Albumin: 2.9 g/dL — ABNORMAL LOW (ref 3.5–5.0)
Anion Gap: 13 mEq/L — ABNORMAL HIGH (ref 3–11)
BILIRUBIN TOTAL: 1.15 mg/dL (ref 0.20–1.20)
BUN: 12.7 mg/dL (ref 7.0–26.0)
CALCIUM: 9.1 mg/dL (ref 8.4–10.4)
CO2: 25 mEq/L (ref 22–29)
Chloride: 98 mEq/L (ref 98–109)
Creatinine: 0.8 mg/dL (ref 0.7–1.3)
EGFR: 81 mL/min/{1.73_m2} — ABNORMAL LOW (ref >90)
Glucose: 153 mg/dl — ABNORMAL HIGH (ref 70–140)
Potassium: 3.3 mEq/L — ABNORMAL LOW (ref 3.5–5.1)
SODIUM: 135 meq/L — AB (ref 136–145)
Total Protein: 6.2 g/dL — ABNORMAL LOW (ref 6.4–8.3)

## 2015-06-18 LAB — CBC WITH DIFFERENTIAL/PLATELET
BASO%: 0.5 % (ref 0.0–2.0)
Basophils Absolute: 0 10*3/uL (ref 0.0–0.1)
EOS ABS: 0 10*3/uL (ref 0.0–0.5)
EOS%: 0.3 % (ref 0.0–7.0)
HCT: 31.6 % — ABNORMAL LOW (ref 38.4–49.9)
HGB: 10.4 g/dL — ABNORMAL LOW (ref 13.0–17.1)
LYMPH%: 5.9 % — ABNORMAL LOW (ref 14.0–49.0)
MCH: 31.7 pg (ref 27.2–33.4)
MCHC: 32.9 g/dL (ref 32.0–36.0)
MCV: 96.4 fL (ref 79.3–98.0)
MONO#: 1 10*3/uL — AB (ref 0.1–0.9)
MONO%: 12.9 % (ref 0.0–14.0)
NEUT%: 80.4 % — AB (ref 39.0–75.0)
NEUTROS ABS: 6.4 10*3/uL (ref 1.5–6.5)
PLATELETS: 206 10*3/uL (ref 140–400)
RBC: 3.28 10*6/uL — ABNORMAL LOW (ref 4.20–5.82)
RDW: 20.6 % — ABNORMAL HIGH (ref 11.0–14.6)
WBC: 8 10*3/uL (ref 4.0–10.3)
lymph#: 0.5 10*3/uL — ABNORMAL LOW (ref 0.9–3.3)

## 2015-06-18 LAB — HOLD TUBE, BLOOD BANK

## 2015-06-18 MED ORDER — ALBUTEROL SULFATE (2.5 MG/3ML) 0.083% IN NEBU
2.5000 mg | INHALATION_SOLUTION | Freq: Once | RESPIRATORY_TRACT | Status: AC
  Administered 2015-06-18: 2.5 mg via RESPIRATORY_TRACT
  Filled 2015-06-18: qty 3

## 2015-06-18 MED ORDER — ALBUTEROL SULFATE HFA 108 (90 BASE) MCG/ACT IN AERS: 2.0000 | INHALATION_SPRAY | Freq: Four times a day (QID) | RESPIRATORY_TRACT | Status: AC | PRN

## 2015-06-18 MED ORDER — SODIUM CHLORIDE 0.9 % IV SOLN
INTRAVENOUS | Status: AC
  Administered 2015-06-18: 17:00:00 via INTRAVENOUS

## 2015-06-18 MED ORDER — ALBUTEROL SULFATE (2.5 MG/3ML) 0.083% IN NEBU
2.5000 mg | INHALATION_SOLUTION | Freq: Once | RESPIRATORY_TRACT | Status: AC
Start: 2015-06-18 — End: 2015-06-18
  Administered 2015-06-18: 2.5 mg via RESPIRATORY_TRACT
  Filled 2015-06-18: qty 3

## 2015-06-18 MED ORDER — ALBUTEROL SULFATE (2.5 MG/3ML) 0.083% IN NEBU
INHALATION_SOLUTION | RESPIRATORY_TRACT | Status: AC
  Filled 2015-06-18: qty 6

## 2015-06-18 MED ORDER — LEVOFLOXACIN 500 MG PO TABS: 500.0000 mg | ORAL_TABLET | Freq: Every day | ORAL | Status: AC

## 2015-06-18 NOTE — Patient Instructions (Signed)

## 2015-06-18 NOTE — Telephone Encounter (Signed)
Called and spoke with pt wife, informed her Selena Lesser NP would like pt to get chest xray before his appt with her today. Informed Dixie to have pt go to [facility] around 130 today and then come to the cancer center and register for his appts. Dixie verbalized understanding.

## 2015-06-18 NOTE — Telephone Encounter (Signed)
TC from pt's son who states his father has developed a worsening cough since Thursday. He is also progressively weaker and is having difficulty coughing up his secretions. Secretions are light green in color Denies fever. Last chemo was on 05/21/15.  He is not eating or taking in fluids well, becomes more short of breath with minimal activity.  Will arrange to have him seen by Selena Lesser, NP in [facility] this afternoon along with labs.

## 2015-06-19 LAB — URINE CULTURE

## 2015-06-20 ENCOUNTER — Encounter: Payer: Self-pay | Admitting: Nurse Practitioner

## 2015-06-20 ENCOUNTER — Other Ambulatory Visit: Payer: Self-pay

## 2015-06-20 ENCOUNTER — Emergency Department (HOSPITAL_COMMUNITY): Payer: Medicare Other

## 2015-06-20 ENCOUNTER — Encounter (HOSPITAL_COMMUNITY): Payer: Self-pay | Admitting: Radiology

## 2015-06-20 ENCOUNTER — Emergency Department (HOSPITAL_COMMUNITY)
Admission: EM | Admit: 2015-06-20 | Discharge: 2015-06-20 | Disposition: A | Discharge status: Other Acute Inpatient Hospital | Payer: Medicare Other | Attending: Emergency Medicine | Admitting: Emergency Medicine

## 2015-06-20 DIAGNOSIS — Z79899 Other long term (current) drug therapy: Secondary | ICD-10-CM | POA: Diagnosis not present

## 2015-06-20 DIAGNOSIS — I639 Cerebral infarction, unspecified: Secondary | ICD-10-CM

## 2015-06-20 DIAGNOSIS — I482 Chronic atrial fibrillation: Secondary | ICD-10-CM

## 2015-06-20 DIAGNOSIS — Z87891 Personal history of nicotine dependence: Secondary | ICD-10-CM | POA: Diagnosis not present

## 2015-06-20 DIAGNOSIS — Z85828 Personal history of other malignant neoplasm of skin: Secondary | ICD-10-CM | POA: Insufficient documentation

## 2015-06-20 DIAGNOSIS — I2510 Atherosclerotic heart disease of native coronary artery without angina pectoris: Secondary | ICD-10-CM | POA: Diagnosis not present

## 2015-06-20 DIAGNOSIS — E785 Hyperlipidemia, unspecified: Secondary | ICD-10-CM | POA: Insufficient documentation

## 2015-06-20 DIAGNOSIS — K219 Gastro-esophageal reflux disease without esophagitis: Secondary | ICD-10-CM | POA: Diagnosis not present

## 2015-06-20 DIAGNOSIS — I252 Old myocardial infarction: Secondary | ICD-10-CM | POA: Insufficient documentation

## 2015-06-20 DIAGNOSIS — I471 Supraventricular tachycardia: Secondary | ICD-10-CM | POA: Insufficient documentation

## 2015-06-20 DIAGNOSIS — F1110 Opioid abuse, uncomplicated: Secondary | ICD-10-CM | POA: Insufficient documentation

## 2015-06-20 DIAGNOSIS — G8100 Flaccid hemiplegia affecting unspecified side: Secondary | ICD-10-CM

## 2015-06-20 DIAGNOSIS — J40 Bronchitis, not specified as acute or chronic: Secondary | ICD-10-CM | POA: Insufficient documentation

## 2015-06-20 DIAGNOSIS — I10 Essential (primary) hypertension: Secondary | ICD-10-CM | POA: Insufficient documentation

## 2015-06-20 DIAGNOSIS — Z7951 Long term (current) use of inhaled steroids: Secondary | ICD-10-CM | POA: Insufficient documentation

## 2015-06-20 DIAGNOSIS — R530 Neoplastic (malignant) related fatigue: Secondary | ICD-10-CM | POA: Insufficient documentation

## 2015-06-20 DIAGNOSIS — R531 Weakness: Secondary | ICD-10-CM | POA: Diagnosis present

## 2015-06-20 DIAGNOSIS — M1990 Unspecified osteoarthritis, unspecified site: Secondary | ICD-10-CM | POA: Diagnosis not present

## 2015-06-20 DIAGNOSIS — R4701 Aphasia: Secondary | ICD-10-CM

## 2015-06-20 DIAGNOSIS — I63412 Cerebral infarction due to embolism of left middle cerebral artery: Secondary | ICD-10-CM | POA: Diagnosis not present

## 2015-06-20 LAB — URINALYSIS, ROUTINE W REFLEX MICROSCOPIC
BILIRUBIN URINE: NEGATIVE
GLUCOSE, UA: NEGATIVE mg/dL
HGB URINE DIPSTICK: NEGATIVE
KETONES UR: NEGATIVE mg/dL
Leukocytes, UA: NEGATIVE
Nitrite: NEGATIVE
PROTEIN: NEGATIVE mg/dL
Specific Gravity, Urine: 1.026 (ref 1.005–1.030)
Urobilinogen, UA: 1 mg/dL (ref 0.0–1.0)
pH: 7 (ref 5.0–8.0)

## 2015-06-20 LAB — ETHANOL: Alcohol, Ethyl (B): <5 mg/dL (ref <5)

## 2015-06-20 LAB — CBC
HEMATOCRIT: 28.6 % — AB (ref 39.0–52.0)
Hemoglobin: 9.5 g/dL — ABNORMAL LOW (ref 13.0–17.0)
MCH: 31.5 pg (ref 26.0–34.0)
MCHC: 33.2 g/dL (ref 30.0–36.0)
MCV: 94.7 fL (ref 78.0–100.0)
Platelets: 180 10*3/uL (ref 150–400)
RBC: 3.02 MIL/uL — ABNORMAL LOW (ref 4.22–5.81)
RDW: 18 % — AB (ref 11.5–15.5)
WBC: 6.8 10*3/uL (ref 4.0–10.5)

## 2015-06-20 LAB — COMPREHENSIVE METABOLIC PANEL
ALBUMIN: 2.6 g/dL — AB (ref 3.5–5.0)
ALT: 17 U/L (ref 17–63)
AST: 36 U/L (ref 15–41)
Alkaline Phosphatase: 89 U/L (ref 38–126)
Anion gap: 8 (ref 5–15)
BUN: 8 mg/dL (ref 6–20)
CO2: 28 mmol/L (ref 22–32)
Calcium: 8.8 mg/dL — ABNORMAL LOW (ref 8.9–10.3)
Chloride: 98 mmol/L — ABNORMAL LOW (ref 101–111)
Creatinine, Ser: 0.78 mg/dL (ref 0.61–1.24)
GFR calc Af Amer: >60 mL/min (ref >60)
GFR calc non Af Amer: >60 mL/min (ref >60)
Glucose, Bld: 115 mg/dL — ABNORMAL HIGH (ref 65–99)
Potassium: 3.4 mmol/L — ABNORMAL LOW (ref 3.5–5.1)
SODIUM: 134 mmol/L — AB (ref 135–145)
TOTAL PROTEIN: 5.7 g/dL — AB (ref 6.5–8.1)
Total Bilirubin: 1 mg/dL (ref 0.3–1.2)

## 2015-06-20 LAB — I-STAT TROPONIN, ED: TROPONIN I, POC: 0.02 ng/mL (ref 0.00–0.08)

## 2015-06-20 LAB — RAPID URINE DRUG SCREEN, HOSP PERFORMED
Amphetamines: NOT DETECTED
BARBITURATES: NOT DETECTED
Benzodiazepines: NOT DETECTED
Cocaine: NOT DETECTED
Opiates: POSITIVE — AB
Tetrahydrocannabinol: NOT DETECTED

## 2015-06-20 LAB — I-STAT CHEM 8, ED
BUN: 8 mg/dL (ref 6–20)
CALCIUM ION: 1.14 mmol/L (ref 1.13–1.30)
CREATININE: 0.7 mg/dL (ref 0.61–1.24)
Chloride: 95 mmol/L — ABNORMAL LOW (ref 101–111)
Glucose, Bld: 116 mg/dL — ABNORMAL HIGH (ref 65–99)
HCT: 28 % — ABNORMAL LOW (ref 39.0–52.0)
HEMOGLOBIN: 9.5 g/dL — AB (ref 13.0–17.0)
POTASSIUM: 3.4 mmol/L — AB (ref 3.5–5.1)
Sodium: 134 mmol/L — ABNORMAL LOW (ref 135–145)
TCO2: 26 mmol/L (ref 0–100)

## 2015-06-20 LAB — DIFFERENTIAL
BASOS PCT: 0 % (ref 0–1)
Basophils Absolute: 0 10*3/uL (ref 0.0–0.1)
EOS ABS: 0 10*3/uL (ref 0.0–0.7)
EOS PCT: 1 % (ref 0–5)
Lymphocytes Relative: 6 % — ABNORMAL LOW (ref 12–46)
Lymphs Abs: 0.4 10*3/uL — ABNORMAL LOW (ref 0.7–4.0)
Monocytes Absolute: 1.1 10*3/uL — ABNORMAL HIGH (ref 0.1–1.0)
Monocytes Relative: 16 % — ABNORMAL HIGH (ref 3–12)
Neutro Abs: 5.3 10*3/uL (ref 1.7–7.7)
Neutrophils Relative %: 77 % (ref 43–77)

## 2015-06-20 LAB — PROTIME-INR
INR: 1.17 (ref 0.00–1.49)
Prothrombin Time: 15 seconds (ref 11.6–15.2)

## 2015-06-20 LAB — APTT: APTT: 36 s (ref 24–37)

## 2015-06-20 MED ORDER — LABETALOL HCL 5 MG/ML IV SOLN
INTRAVENOUS | Status: AC
  Filled 2015-06-20: qty 4

## 2015-06-20 MED ORDER — ROCURONIUM BROMIDE 50 MG/5ML IV SOLN
100.0000 mg | Freq: Once | INTRAVENOUS | Status: AC
  Administered 2015-06-20: 100 mg via INTRAVENOUS

## 2015-06-20 MED ORDER — PROPOFOL 1000 MG/100ML IV EMUL
5.0000 ug/kg/min | Freq: Once | INTRAVENOUS | Status: AC
  Administered 2015-06-20: 10 ug/kg/min via INTRAVENOUS
  Filled 2015-06-20: qty 100

## 2015-06-20 MED ORDER — ETOMIDATE 2 MG/ML IV SOLN
20.0000 mg | Freq: Once | INTRAVENOUS | Status: AC
  Administered 2015-06-20: 20 mg via INTRAVENOUS

## 2015-06-20 MED ORDER — LABETALOL HCL 5 MG/ML IV SOLN
10.0000 mg | Freq: Once | INTRAVENOUS | Status: AC
  Administered 2015-06-20: 10 mg via INTRAVENOUS

## 2015-06-20 MED ORDER — IOHEXOL 350 MG/ML SOLN
50.0000 mL | Freq: Once | INTRAVENOUS | Status: AC | PRN
  Administered 2015-06-20: 50 mL via INTRAVENOUS

## 2015-06-20 MED ORDER — SUCCINYLCHOLINE CHLORIDE 20 MG/ML IJ SOLN
100.0000 mg | Freq: Once | INTRAMUSCULAR | Status: DC
  Filled 2015-06-20: qty 5

## 2015-06-20 NOTE — Assessment & Plan Note (Signed)
Patient complaining of increased productive cough of green secretions and shortness of breath within the past week or so.  He denies any fevers or chills recently.  He denies any chest pain, chest pressure, or pain with inspiration.  On exam.-Patient with rhonchi bilaterally; and occasional wheeze.  No active cough on exam.  Trace shortness of breath only with conversation.  Patient consistently has a horse voice as his baseline.  Vital signs revealed a heart rate of 115, O2 sat 92% on room air, and temperature of 99.1.  Chest x-ray obtained today revealed improved left upper lung ventilation with less lung collapse suspected.  Compared to the recent CT on 06/07/2015.  Residual left perihilar interstitial up a city such that superimposed infection is not excluded.  No pleural effusion.  Patient received albuterol nebulizer treatments 2 while at the cancer center; which did greatly improve patient's congestion and shortness of breath.  Patient will be prescribed Levaquin and a biotics and also be given a prescription for albuterol inhalers to use at home.  Patient was advised to call/return or go directly to the emergency department for any worsening symptoms whatsoever.

## 2015-06-20 NOTE — Progress Notes (Signed)
Patient intubated for airway protection by MD.  Positive color change.  Bilateral breath sounds noted.  Chest xray pending for tube placement.  No apparent complications.

## 2015-06-20 NOTE — Consult Note (Signed)
NEURO HOSPITALIST CONSULT NOTE   Referring physician: [place]   Reason for Consult: Code stroke LSN 06-20-2015 at 1450 No tPA was given due to history of lung and brain cancer.   HPI:                                                                                                                                          Luke Contreras is an 81 y.o. male  With Metastatic non-small cell lung cancer, poorly differentiated adenocarcinoma diagnosed in January 2016. Status post SRS treatment to single brain lesion under the care of Dr. Tammi Klippel.  Palliative radiation therapy under the care of the [facility]. patient also has known Afib--currently not in A-fib.  Today he was with family at 61 when suddenly he was noted to stop talking and have left eye deviation with right arm and leg flaccidity. Code stroke was activated and patient was brought to [facility].  On arrival he was aphasic, showing right facial droop, leftward eye deviation and right hemiplegia. CT head was negative for stroke, but CTA showed thrombus in distal left ICA and markedly reduced flow and left MCA.  tPA was not administered given history of brain metastasis and lung cancer. Patient was deemed a candidate for interventional radiology procedures for revascularization. He is being transferred to [facility] for further management. Prior to transfer he was intubated in ED due to inability to protect airway. NIH stroke score was 24.    Past Medical History  Diagnosis Date  . History of acute anterior wall myocardial infarction 11/2006  . Atrial fibrillation   . Dyslipidemia   . Hypertension   . Coronary artery disease     history of  obstructive single-valve disease  . PAC (premature atrial contraction)   . Left ventricular dysfunction     withEF of 40-45%   . Complication of anesthesia   . Shortness of breath dyspnea     " when talking and completing a long sentence  . GERD (gastroesophageal reflux  disease)   . Arthritis     hands, knees  . Cancer     skin cancer- side of nausea    Past Surgical History  Procedure Laterality Date  . Cardiac catheterization  11/26/2006    Est. EF of 40% --  A 100% obstructive proximal/mid left anterior descending artery consistent with recent anterior myocardial infarction --  Moderate mid right coronary artery disease --Mild left ventricular dysfunction with EF of 40%  with distal anterior akinesis and periapical dyskinesis    -- Kaylyn Lim., M.D.  . Colonoscopy    . Video bronchoscopy with endobronchial ultrasound N/A 01/11/2015    Procedure: VIDEO BRONCHOSCOPY WITH ENDOBRONCHIAL ULTRASOUND;  Surgeon: Grace Isaac, MD;  Location: [facility];  Service: Thoracic;  Laterality: N/A;  . Lung biopsy N/A 01/11/2015    Procedure: LUNG BIOPSY;  Surgeon: Grace Isaac, MD;  Location: [facility] OR;  Service: Thoracic;  Laterality: N/A;    Family History  Problem Relation Age of Onset  . Heart attack Father 65    Social History:  reports that he quit smoking about 36 years ago. His smoking use included Cigarettes. He has a 45 pack-year smoking history. He has never used smokeless tobacco. He reports that he does not drink alcohol or use illicit drugs.  Allergies  Allergen Reactions  . Cephalexin Other (See Comments)    Stomach pain  . Aspirin     Bother stomach    MEDICATIONS:                                                                                                                     Current Facility-Administered Medications  Medication Dose Route Frequency Provider Last Rate Last Dose  . etomidate (AMIDATE) injection 20 mg  20 mg Intravenous Once Pamella Pert, MD      . succinylcholine (ANECTINE) injection 100 mg  100 mg Intravenous Once Pamella Pert, MD       Current Outpatient Prescriptions  Medication Sig Dispense Refill  . albuterol (PROVENTIL HFA;VENTOLIN HFA) 108 (90 BASE) MCG/ACT inhaler Inhale 2 puffs into the lungs  every 6 (six) hours as needed for wheezing or shortness of breath. 1 Inhaler 2  . alum & mag hydroxide-simeth (MAALOX/MYLANTA) 200-200-20 MG/5ML suspension Take by mouth every 6 (six) hours as needed for indigestion or heartburn.    . Alum & Mag Hydroxide-Simeth (MAGIC MOUTHWASH W/LIDOCAINE) SOLN Take 5 mLs by mouth 4 (four) times daily as needed for mouth pain ([employer] mouthwash formula w/ lidocaine 1:1.). 240 mL 1  . atorvastatin (LIPITOR) 40 MG tablet TAKE 1/2 TABLET (= 20 MG   TOTAL) DAILY 45 tablet 2  . clorazepate (TRANXENE) 7.5 MG tablet Take 7.5 mg by mouth at bedtime.      . Cyanocobalamin (VITAMIN B-12 PO) Take 500 mcg by mouth daily.     Marland Kitchen dexamethasone (DECADRON) 4 MG tablet Take 1 tablet by mouth twice daily with food the day before, the day of and the day after chemotherapy 30 tablet 1  . Dextromethorphan-Guaifenesin (CORICIDIN HBP CONGESTION/COUGH PO) Take by mouth daily as needed.    Marland Kitchen emollient (BIAFINE) cream Apply topically as needed.    . fluorouracil (EFUDEX) 5 % cream Apply 1 application topically daily.   0  . folic acid (FOLVITE) 1 MG tablet Take 1 tablet (1 mg total) by mouth daily. 30 tablet 3  . levofloxacin (LEVAQUIN) 500 MG tablet Take 1 tablet (500 mg total) by mouth daily. 7 tablet 0  . lidocaine (XYLOCAINE) 2 % solution Patient: Mix 1part 2% viscous lidocaine, 1part H20. Swish and/or swallow 77m of this mixture, 326m before meals and at bedtime, up to QID 100 mL 1  . lisinopril (PRINIVIL,ZESTRIL) 2.5 MG tablet Take 1 tablet (2.5  mg total) by mouth daily. 90 tablet 0  . Misc Natural Products (OSTEO BI-FLEX JOINT SHIELD PO) Take 1 tablet by mouth daily.      . multivitamin (THERAGRAN) per tablet Take 1 tablet by mouth daily.      . nitroGLYCERIN (NITROSTAT) 0.4 MG SL tablet Place 1 tablet (0.4 mg total) under the tongue every 5 (five) minutes as needed for chest pain. 25 tablet 5  . omeprazole (PRILOSEC) 20 MG capsule Take 20 mg by mouth daily.     Marland Kitchen  oxyCODONE-acetaminophen (PERCOCET/ROXICET) 5-325 MG per tablet Take 1 tablet by mouth every 4 (four) hours as needed for severe pain. 30 tablet 0  . prochlorperazine (COMPAZINE) 10 MG tablet Take 1 tablet (10 mg total) by mouth every 6 (six) hours as needed for nausea or vomiting. 30 tablet 1  . sucralfate (CARAFATE) 1 G tablet Dissolve 1 tablet in 10 mL H20 and swallow up to QID, in between meals, to soothe esophagus. 60 tablet 1  . Wound Cleansers (RADIAPLEX EX) Apply topically.        ROS:                                                                                                                                       History obtained from unobtainable from patient due to aphasia   Blood pressure 134/84, pulse 113, resp. rate 26, height '5\' 6"'$  (1.676 m), weight 80.06 kg (176 lb 8 oz), SpO2 97 %.   Neurologic Examination:                                                                                                      HEENT-  Normocephalic, no lesions, without obvious abnormality.  Normal external eye and conjunctiva.  Normal TM's bilaterally.  Normal auditory canals and external ears. Normal external nose, mucus membranes and septum.  Normal pharynx. Cardiovascular- S1, S2 normal, pulses palpable throughout   Lungs- mild expiratory wheezing heard both upper lobes Abdomen- normal findings: bowel sounds normal Extremities- no edema Lymph-no adenopathy palpable Musculoskeletal-no joint tenderness, deformity or swelling Skin-warm and dry, no hyperpigmentation, vitiligo, or suspicious lesions  Neurological Examination Mental Status: Alert, aphasic and unable to follow commands. Cranial Nerves: II: Discs flat bilaterally; right field cut, pupils equal, round, reactive to light and accommodation III,IV, VI: ptosis not present, does not cross midline to the right with left gaze deviation V,VII: right facial droop, facial light touch sensation decreased on the right VIII: hearing  normal  bilaterally XII: midline tongue extension Motor: Right : Upper extremity   0/5    Left:     Upper extremity   5/5  Lower extremity   0/5     Lower extremity   5/5 Tone and bulk:normal tone throughout; no atrophy noted Sensory: Pinprick and light touch intact throughout, bilaterally Deep Tendon Reflexes: 2+ and symmetric throughout  Plantars: Right: mute   Left: downgoing Cerebellar: Unable to assess due to aphasia and could not follow commands Gait: not able to assess      Lab Results: Basic Metabolic Panel:  Recent Labs Lab 06/18/15 1406 06/20/15 1540  NA 135* 134*  K 3.3* 3.4*  CL  --  95*  CO2 25  --   GLUCOSE 153* 116*  BUN 12.7 8  CREATININE 0.8 0.70  CALCIUM 9.1  --     Liver Function Tests:  Recent Labs Lab 06/18/15 1406  AST 35*  ALT 16  ALKPHOS 99  BILITOT 1.15  PROT 6.2*  ALBUMIN 2.9*   No results for input(s): LIPASE, AMYLASE in the last 168 hours. No results for input(s): AMMONIA in the last 168 hours.  CBC:  Recent Labs Lab 06/18/15 1405 06/20/15 1535 06/20/15 1540  WBC 8.0 6.8  --   NEUTROABS 6.4 5.3  --   HGB 10.4* 9.5* 9.5*  HCT 31.6* 28.6* 28.0*  MCV 96.4 94.7  --   PLT 206 180  --     Cardiac Enzymes: No results for input(s): CKTOTAL, CKMB, CKMBINDEX, TROPONINI in the last 168 hours.  Lipid Panel: No results for input(s): CHOL, TRIG, HDL, CHOLHDL, VLDL, LDLCALC in the last 168 hours.  CBG: No results for input(s): GLUCAP in the last 168 hours.  Microbiology: Results for orders placed or performed in visit on 06/18/15  Urine Culture     Status: None   Collection Time: 06/18/15  2:06 PM  Result Value Ref Range Status   Urine Culture, Routine Culture, Urine  Final    Comment: Final - ===== COLONY COUNT: ===== 40,000 COLONIES/ML Multiple bacterial morphotypes present, none predominant. Suggest appropriate recollection if  clinically indicated.     Coagulation Studies: Prothrombin time 15.0, INR 1.17, and APTT  36.  Imaging: No results found.    Etta Quill PA-C [facility] [phone]  06/20/2015, 4:05 PM   Assessment/Plan:  81 year old man with a history of atrial fibrillation, not on anticoagulation, as well as history of lung cancer and brain metastasis with radiation therapy, presenting with acute left MCA stroke with thrombus demonstrated in left ICA and MCA. He is not a candidate for TPA, and is being transferred to [facility] for further management, revascularization procedures and interventional radiology.  Accepting physicians: Dr. Bryon Lions Dr. Drue Stager  This patient is critically ill and at significant risk of neurological worsening or death, and care requires constant monitoring of vital signs, hemodynamics,respiratory and cardiac monitoring, neurological assessment, discussion with family, other specialists and medical decision making of high complexity. Total critical care time was 60 minutes.  Rush Farmer M.D. [facility] [phone]

## 2015-06-20 NOTE — Assessment & Plan Note (Signed)
Appear that has decreased to 2.9.  Patient was encouraged to push protein in his diet is much as possible.

## 2015-06-20 NOTE — Code Documentation (Signed)
81yo male arriving to [facility] via [place] at 1532.  Per EMS patient was mid-sentence talking with family when he was suddenly unable to respond.  EMS called and assessed left gaze preference, right facial droop, nonverbal and not following commands, and right side flaccid.  Code stroke activated.  Stroke team at the bedside on arrival.  Labs drawn, patient cleared by Dr. Joya Gaskins and patient to [place].  CTA completed per Dr. Nicole Kindred.  NIHSS 24, see documentation for details and code stroke times.  Patient is not a candidate for treatment with tPA d/t h/o lung cancer with brain metastasis s/p stereotatic radiosurgery per Dr. Nicole Kindred.  Patient is a candidate for endovascular intervention and plans in place to transfer to [facility] to Dr. Owens Shark.  Bedside handoff with ED RN Junie Panning.

## 2015-06-20 NOTE — Assessment & Plan Note (Signed)
Patient is complaining of increased fatigue; as well as bronchitis-type symptoms.  He feels slightly dehydrated today.  Patient will receive IV fluid rehydration while at the cancer Center today.  He was also encouraged to push fluids at home is much as possible.

## 2015-06-20 NOTE — ED Provider Notes (Signed)
CSN: [id number]     Arrival date & time 06/20/15  1532 History   First MD Initiated Contact with Patient 06/20/15 1548     Chief Complaint  Patient presents with  . Code Stroke    An emergency department physician performed an initial assessment on this suspected stroke patient at 1533 (wright). (Consider location/radiation/quality/duration/timing/severity/associated sxs/prior Treatment) Patient is a 81 y.o. male presenting with Acute Neurological Problem. The history is provided by the EMS personnel (family).  Cerebrovascular Accident This is a new problem. Episode onset: 2:50 pm today. The problem occurs constantly. The problem has not changed since onset.Pertinent negatives include no chest pain, no abdominal pain, no headaches and no shortness of breath. Nothing aggravates the symptoms. Nothing relieves the symptoms. He has tried nothing for the symptoms. The treatment provided no relief.    Past Medical History  Diagnosis Date  . History of acute anterior wall myocardial infarction 11/2006  . Atrial fibrillation   . Dyslipidemia   . Hypertension   . Coronary artery disease     history of  obstructive single-valve disease  . PAC (premature atrial contraction)   . Left ventricular dysfunction     withEF of 40-45%   . Complication of anesthesia   . Shortness of breath dyspnea     " when talking and completing a long sentence  . GERD (gastroesophageal reflux disease)   . Arthritis     hands, knees  . Cancer     skin cancer- side of nausea   Past Surgical History  Procedure Laterality Date  . Cardiac catheterization  11/26/2006    Est. EF of 40% --  A 100% obstructive proximal/mid left anterior descending artery consistent with recent anterior myocardial infarction --  Moderate mid right coronary artery disease --Mild left ventricular dysfunction with EF of 40%  with distal anterior akinesis and periapical dyskinesis    -- Kaylyn Lim., M.D.  . Colonoscopy    . Video  bronchoscopy with endobronchial ultrasound N/A 01/11/2015    Procedure: VIDEO BRONCHOSCOPY WITH ENDOBRONCHIAL ULTRASOUND;  Surgeon: Grace Isaac, MD;  Location: [facility] OR;  Service: Thoracic;  Laterality: N/A;  . Lung biopsy N/A 01/11/2015    Procedure: LUNG BIOPSY;  Surgeon: Grace Isaac, MD;  Location: [facility] OR;  Service: Thoracic;  Laterality: N/A;   Family History  Problem Relation Age of Onset  . Heart attack Father 76   History  Substance Use Topics  . Smoking status: Former Smoker -- 1.50 packs/day for 30 years    Types: Cigarettes    Quit date: 12/14/1978  . Smokeless tobacco: Never Used  . Alcohol Use: No    Review of Systems  Constitutional: Negative for fever.  HENT: Negative for drooling and rhinorrhea.   Eyes: Negative for pain.  Respiratory: Negative for cough and shortness of breath.   Cardiovascular: Negative for chest pain and leg swelling.  Gastrointestinal: Negative for nausea, vomiting, abdominal pain and diarrhea.  Genitourinary: Negative for dysuria and hematuria.  Musculoskeletal: Negative for gait problem and neck pain.  Skin: Negative for color change.  Neurological: Negative for numbness and headaches.  Hematological: Negative for adenopathy.  Psychiatric/Behavioral: Negative for behavioral problems.  All other systems reviewed and are negative.     Allergies  Cephalexin and Aspirin  Home Medications   Prior to Admission medications   Medication Sig Start Date End Date Taking? Authorizing Provider  albuterol (PROVENTIL HFA;VENTOLIN HFA) 108 (90 BASE) MCG/ACT inhaler Inhale 2 puffs into the  lungs every 6 (six) hours as needed for wheezing or shortness of breath. 06/18/15   Susanne Borders, NP  alum & mag hydroxide-simeth (MAALOX/MYLANTA) 200-200-20 MG/5ML suspension Take by mouth every 6 (six) hours as needed for indigestion or heartburn.    Historical Provider, MD  Alum & Mag Hydroxide-Simeth (MAGIC MOUTHWASH W/LIDOCAINE) SOLN Take 5 mLs by mouth  4 (four) times daily as needed for mouth pain ([facility] mouthwash formula w/ lidocaine 1:1.). 04/02/15   Susanne Borders, NP  atorvastatin (LIPITOR) 40 MG tablet TAKE 1/2 TABLET (= 20 MG   TOTAL) DAILY 03/12/15   Peter M Martinique, MD  clorazepate (TRANXENE) 7.5 MG tablet Take 7.5 mg by mouth at bedtime.      Historical Provider, MD  Cyanocobalamin (VITAMIN B-12 PO) Take 500 mcg by mouth daily.     Historical Provider, MD  dexamethasone (DECADRON) 4 MG tablet Take 1 tablet by mouth twice daily with food the day before, the day of and the day after chemotherapy 01/24/15   Carlton Adam, PA-C  Dextromethorphan-Guaifenesin (CORICIDIN HBP CONGESTION/COUGH PO) Take by mouth daily as needed.    Historical Provider, MD  emollient (BIAFINE) cream Apply topically as needed.    Historical Provider, MD  fluorouracil (EFUDEX) 5 % cream Apply 1 application topically daily.  12/22/14   Historical Provider, MD  folic acid (FOLVITE) 1 MG tablet Take 1 tablet (1 mg total) by mouth daily. 05/30/15   Curt Bears, MD  levofloxacin (LEVAQUIN) 500 MG tablet Take 1 tablet (500 mg total) by mouth daily. 06/18/15   Susanne Borders, NP  lidocaine (XYLOCAINE) 2 % solution Patient: Mix 1part 2% viscous lidocaine, 1part H20. Swish and/or swallow 6m of this mixture, 357m before meals and at bedtime, up to QID 03/04/15   SaEppie GibsonMD  lisinopril (PRINIVIL,ZESTRIL) 2.5 MG tablet Take 1 tablet (2.5 mg total) by mouth daily. 12/21/14   Peter M JoMartiniqueMD  Misc Natural Products (OSTEO BI-FLEX JOINT SHIELD PO) Take 1 tablet by mouth daily.      Historical Provider, MD  multivitamin ([facility]Fl[facility] Memorial Hospitalper tablet Take 1 tablet by mouth daily.      Historical Provider, MD  nitroGLYCERIN (NITROSTAT) 0.4 MG SL tablet Place 1 tablet (0.4 mg total) under the tongue every 5 (five) minutes as needed for chest pain. 02/04/15   Peter M JoMartiniqueMD  omeprazole (PRILOSEC) 20 MG capsule Take 20 mg by mouth daily.  03/18/12   Historical Provider, MD   oxyCODONE-acetaminophen (PERCOCET/ROXICET) 5-325 MG per tablet Take 1 tablet by mouth every 4 (four) hours as needed for severe pain. 03/27/15   JoKyung RuddMD  prochlorperazine (COMPAZINE) 10 MG tablet Take 1 tablet (10 mg total) by mouth every 6 (six) hours as needed for nausea or vomiting. 01/24/15   AdCarlton AdamPA-C  sucralfate (CARAFATE) 1 G tablet Dissolve 1 tablet in 10 mL H20 and swallow up to QID, in between meals, to soothe esophagus. 03/04/15   SaEppie GibsonMD  Wound Cleansers (RADIAPLEX EX) Apply topically.    Historical Provider, MD   BP 134/84 mmHg  Pulse 113  Resp 26  Ht '5\' 6"'$  (1.676 m)  Wt 176 lb 8 oz (80.06 kg)  BMI 28.50 kg/m2  SpO2 97% Physical Exam  Constitutional: He appears well-developed and well-nourished.  HENT:  Head: Normocephalic and atraumatic.  Right Ear: External ear normal.  Left Ear: External ear normal.  Nose: Nose normal.  Mouth/Throat: Oropharynx is clear and moist. No oropharyngeal  exudate.  Eyes: Conjunctivae and EOM are normal. Pupils are equal, round, and reactive to light.  Neck: Normal range of motion. Neck supple.  Cardiovascular: Normal rate, regular rhythm, normal heart sounds and intact distal pulses.  Exam reveals no gallop and no friction rub.   No murmur heard. Occasional run of SVT.  Pulmonary/Chest: Effort normal. No respiratory distress. He has no wheezes.  Rhonchorous lung sounds bilaterally.  Abdominal: Soft. Bowel sounds are normal. He exhibits no distension. There is no tenderness. There is no rebound and no guarding.  Musculoskeletal: Normal range of motion. He exhibits no edema or tenderness.  Neurological: He is alert. GCS eye subscore is 4. GCS verbal subscore is 1. GCS motor subscore is 4.  Patient is alert but not following commands. Appears mildly restless.  Skin: Skin is warm and dry.  Psychiatric: He has a normal mood and affect. His behavior is normal.  Nursing note and vitals reviewed.   ED Course   Procedures (including critical care time) Labs Review Labs Reviewed  CBC - Abnormal; Notable for the following:    RBC 3.02 (*)    Hemoglobin 9.5 (*)    HCT 28.6 (*)    RDW 18.0 (*)    All other components within normal limits  DIFFERENTIAL - Abnormal; Notable for the following:    Lymphocytes Relative 6 (*)    Lymphs Abs 0.4 (*)    Monocytes Relative 16 (*)    Monocytes Absolute 1.1 (*)    All other components within normal limits  I-STAT CHEM 8, ED - Abnormal; Notable for the following:    Sodium 134 (*)    Potassium 3.4 (*)    Chloride 95 (*)    Glucose, Bld 116 (*)    Hemoglobin 9.5 (*)    HCT 28.0 (*)    All other components within normal limits  ETHANOL  PROTIME-INR  APTT  COMPREHENSIVE METABOLIC PANEL  URINE RAPID DRUG SCREEN, HOSP PERFORMED  URINALYSIS, ROUTINE W REFLEX MICROSCOPIC (NOT AT [facility])  I-STAT TROPOININ, ED    Imaging Review Ct Angio Head W/cm &/or Wo Cm  06/20/2015   ADDENDUM REPORT: 06/20/2015 16:47  ADDENDUM: I paged to Dr. Nicole Kindred with the results at 4:13 p.m. He returned the page at 4:20 p.m. as he was all radiating contact with the stroke interventionalist. The findings with thus communicated at 4:20 p.m., not 4:42 p.m.   Electronically Signed   By: San Morelle M.D.   On: 06/20/2015 16:47   06/20/2015   CLINICAL DATA:  Right-sided weakness.  Aphasia.  EXAM: CT ANGIOGRAPHY HEAD AND NECK  TECHNIQUE: Multidetector CT imaging of the head and neck was performed using the standard protocol during bolus administration of intravenous contrast. Multiplanar CT image reconstructions and MIPs were obtained to evaluate the vascular anatomy. Carotid stenosis measurements (when applicable) are obtained utilizing NASCET criteria, using the distal internal carotid diameter as the denominator.  CONTRAST:  46m OMNIPAQUE IOHEXOL 350 MG/ML SOLN  COMPARISON:  None.  MRI brain 04/07/2015.  FINDINGS: CT HEAD  Brain: Decreased attenuation is noted in the left insular  cortex, portions the left lentiform nucleus, and portions of the left anterior frontal lobe. Mild atrophy and white matter changes are evident  Calvarium and skull base: Intact  Paranasal sinuses: Fluid levels are present at the maxillary sinuses bilaterally. The remaining paranasal sinuses and the mastoid air cells are clear.  Orbits: Bilateral lens replacements are present. The globes and orbits are otherwise intact.  CTA NECK  Aortic arch: A 3 vessel arch configuration is present. Atherosclerotic calcifications are present at the left subclavian artery without a significant stenosis relative to the more distal vessel.  Right carotid system: The right common carotid artery is within normal limits. Minimal atherosclerotic changes are present at the right carotid bifurcation. The cervical right ICA is normal.  Left carotid system: The left common carotid artery is within normal limits. The bifurcation demonstrates mild atherosclerotic change. There is a gradual decrease and contrast within the left internal carotid artery compatible with acute dissection. There is no significant contrast at the level of the skullbase.  Vertebral arteries:The vertebral arteries originate from the subclavian arteries bilaterally. Vertebral arteries are codominant. There is minimal irregularity at the C3-4 level bilaterally without significant stenosis or vascular injury.  Skeleton: Multilevel degenerate changes are present throughout the cervical spine with uncovertebral and facet spurring resulting in multilevel foraminal stenosis. No focal lytic or blastic lesions are present.  Other neck: No focal mucosal or submucosal lesions are present. Asymmetry of the vocal cords suggests left-sided paralysis heterogeneity of thyroid is noted without significant stenosis.  There is persistent collapse of the left upper lobe secondary to a left mediastinal mass. A 7 mm pleural-based nodule posteriorly in the right lung is stable in size.  Emphysematous changes are noted. Superimposed edema is increased since 06/07/2015.  CTA HEAD  Anterior circulation: Atherosclerotic changes are present within the cavernous right internal carotid artery without a significant stenosis. The left internal carotid artery is occluded just below the skullbase. There is no contrast in the left internal carotid artery to the ICA terminus. There is contrast within the left A1 segment with a patent anterior communicating artery. The left M1 segment is not opacified. There is some collateral filling of posterior left MCA branch vessels. Anterior MCA branch vessels are not filled. Contrast can be seen to the level of the MCA bifurcation.  The right M1 segment is intact. The right MCA bifurcation is normal. MCA branch vessels on the right are intact.  Posterior circulation: The right vertebral artery is the dominant vessel. The left PICA origin is visualized and intact. The right AICA is dominant. Both posterior cerebral arteries originate from the basilar tip. PCA branch vessels are intact.  Venous sinuses: The dural sinuses are patent. The straight sinus and deep cerebral veins are intact. Cortical veins are within normal limits. The cortical veins are visualized on the right. There is poor visualization of cortical veins on the left, presumably secondary to lack arterial input.  Anatomic variants: None  Delayed phase: Contrast is seen in the left M1 segment on the delayed images suggesting there may be some flow in the left MCA. There is asymmetric enhancement of the left lentiform nucleus and caudate head suggesting luxury perfusion. The right periventricular metastasis is not visualized.  IMPRESSION: 1. 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 2. No contrast is visualized within the more distal left ICA or left M1 segment. 3. ACA branches fill bilaterally via a patent anterior communicating artery from the right. 4. There is some collateral  flow feeding distal left MCA branches, particularly the posterior MCA branches with no significant flow in the anterior division. 5. Decreased attenuation in the left lentiform nucleus, insular ribbon common and portions of the left frontal lobe compatible with early infarction. Persistent collapse and left upper lobe secondary teen and left mediastinal mass. 6. Stable 7 cm right upper lobe peripheral nodule. 7. Increased edema within both lungs.  8. Emphysema. ASPECTS score = 6/10  [place] Stroke Program Early CT Score  Normal score = 10  These results were called by telephone at the time of interpretation on 06/20/2015 at 4:42 pm to Dr. Nicole Kindred , who verbally acknowledged these results.  Electronically Signed: By: San Morelle M.D. On: 06/20/2015 16:43   Ct Angio Neck W/cm &/or Wo/cm  06/20/2015   ADDENDUM REPORT: 06/20/2015 16:47  ADDENDUM: I paged to Dr. Nicole Kindred with the results at 4:13 p.m. He returned the page at 4:20 p.m. as he was all radiating contact with the stroke interventionalist. The findings with thus communicated at 4:20 p.m., not 4:42 p.m.   Electronically Signed   By: San Morelle M.D.   On: 06/20/2015 16:47   06/20/2015   CLINICAL DATA:  Right-sided weakness.  Aphasia.  EXAM: CT ANGIOGRAPHY HEAD AND NECK  TECHNIQUE: Multidetector CT imaging of the head and neck was performed using the standard protocol during bolus administration of intravenous contrast. Multiplanar CT image reconstructions and MIPs were obtained to evaluate the vascular anatomy. Carotid stenosis measurements (when applicable) are obtained utilizing NASCET criteria, using the distal internal carotid diameter as the denominator.  CONTRAST:  77m OMNIPAQUE IOHEXOL 350 MG/ML SOLN  COMPARISON:  None.  MRI brain 04/07/2015.  FINDINGS: CT HEAD  Brain: Decreased attenuation is noted in the left insular cortex, portions the left lentiform nucleus, and portions of the left anterior frontal lobe. Mild atrophy and white  matter changes are evident  Calvarium and skull base: Intact  Paranasal sinuses: Fluid levels are present at the maxillary sinuses bilaterally. The remaining paranasal sinuses and the mastoid air cells are clear.  Orbits: Bilateral lens replacements are present. The globes and orbits are otherwise intact.  CTA NECK  Aortic arch: A 3 vessel arch configuration is present. Atherosclerotic calcifications are present at the left subclavian artery without a significant stenosis relative to the more distal vessel.  Right carotid system: The right common carotid artery is within normal limits. Minimal atherosclerotic changes are present at the right carotid bifurcation. The cervical right ICA is normal.  Left carotid system: The left common carotid artery is within normal limits. The bifurcation demonstrates mild atherosclerotic change. There is a gradual decrease and contrast within the left internal carotid artery compatible with acute dissection. There is no significant contrast at the level of the skullbase.  Vertebral arteries:The vertebral arteries originate from the subclavian arteries bilaterally. Vertebral arteries are codominant. There is minimal irregularity at the C3-4 level bilaterally without significant stenosis or vascular injury.  Skeleton: Multilevel degenerate changes are present throughout the cervical spine with uncovertebral and facet spurring resulting in multilevel foraminal stenosis. No focal lytic or blastic lesions are present.  Other neck: No focal mucosal or submucosal lesions are present. Asymmetry of the vocal cords suggests left-sided paralysis heterogeneity of thyroid is noted without significant stenosis.  There is persistent collapse of the left upper lobe secondary to a left mediastinal mass. A 7 mm pleural-based nodule posteriorly in the right lung is stable in size. Emphysematous changes are noted. Superimposed edema is increased since 06/07/2015.  CTA HEAD  Anterior circulation:  Atherosclerotic changes are present within the cavernous right internal carotid artery without a significant stenosis. The left internal carotid artery is occluded just below the skullbase. There is no contrast in the left internal carotid artery to the ICA terminus. There is contrast within the left A1 segment with a patent anterior communicating artery. The left M1 segment is not opacified.  There is some collateral filling of posterior left MCA branch vessels. Anterior MCA branch vessels are not filled. Contrast can be seen to the level of the MCA bifurcation.  The right M1 segment is intact. The right MCA bifurcation is normal. MCA branch vessels on the right are intact.  Posterior circulation: The right vertebral artery is the dominant vessel. The left PICA origin is visualized and intact. The right AICA is dominant. Both posterior cerebral arteries originate from the basilar tip. PCA branch vessels are intact.  Venous sinuses: The dural sinuses are patent. The straight sinus and deep cerebral veins are intact. Cortical veins are within normal limits. The cortical veins are visualized on the right. There is poor visualization of cortical veins on the left, presumably secondary to lack arterial input.  Anatomic variants: None  Delayed phase: Contrast is seen in the left M1 segment on the delayed images suggesting there may be some flow in the left MCA. There is asymmetric enhancement of the left lentiform nucleus and caudate head suggesting luxury perfusion. The right periventricular metastasis is not visualized.  IMPRESSION: 1. 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 2. No contrast is visualized within the more distal left ICA or left M1 segment. 3. ACA branches fill bilaterally via a patent anterior communicating artery from the right. 4. There is some collateral flow feeding distal left MCA branches, particularly the posterior MCA branches with no significant flow in the  anterior division. 5. Decreased attenuation in the left lentiform nucleus, insular ribbon common and portions of the left frontal lobe compatible with early infarction. Persistent collapse and left upper lobe secondary teen and left mediastinal mass. 6. Stable 7 cm right upper lobe peripheral nodule. 7. Increased edema within both lungs. 8. Emphysema. ASPECTS score = 6/10  [place] Stroke Program Early CT Score  Normal score = 10  These results were called by telephone at the time of interpretation on 06/20/2015 at 4:42 pm to Dr. Nicole Kindred , who verbally acknowledged these results.  Electronically Signed: By: San Morelle M.D. On: 06/20/2015 16:43   Dg Chest Portable 1 View  06/20/2015   CLINICAL DATA:  Evaluate endotracheal tube positioned after advancement  EXAM: PORTABLE CHEST - 1 VIEW  COMPARISON:  Portable chest x-ray of 06/20/2015 at 1629 hours, and CT chest of 06/07/2015  FINDINGS: The tip of the endotracheal tube is approximately 6.4 cm above the carina. There is no change in the abnormal opacity overlying the left suprahilar region Ing and extending in the left upper lobe as noted previously by CT. Biapical pleural-parenchymal scarring remains. Heart size is stable.  IMPRESSION: 1. Tip of endotracheal tube is 6.4 cm above the carina. 2. No change in abnormal opacity in the left suprahilar region extending into the left upper lobe.   Electronically Signed   By: Ivar Drape M.D.   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  Stable heart size and pulmonary vascularity.  Increased LEFT upper lobe opacity compatible with atelectasis and infiltrate with underlying known LEFT upper lobe  mass.  Calcified LEFT hilar adenopathy.  RIGHT apex scarring and minimal RIGHT basilar atelectasis.  No pleural effusion or pneumothorax.  IMPRESSION:  Satisfactory endotracheal tube position.  Increased LEFT upper lobe opacity likely representing known LEFT upper lobe tumor with increased superimposed atelectasis and infiltrate.   Electronically Signed   By: Lavonia Dana M.D.   On: 06/20/2015 16:38     EKG Interpretation   Date/Time:  Thursday June 20 2015 16:00:42 EDT Ventricular Rate:  152 PR Interval:  79 QRS Duration: 84 QT Interval:  341 QTC Calculation: 542 R Axis:   37 Text Interpretation:  Supraventricular tachycardia Anteroseptal infarct,  old Confirmed by Sina Lucchesi  MD, [place] ([id number]) on 06/21/2015 12:19:21 AM     CRITICAL CARE Performed by: Pamella Pert, S Total critical care time: 30 min Critical care time was exclusive of separately billable procedures and treating other patients. Critical care was necessary to treat or prevent imminent or life-threatening deterioration. Critical care was time spent personally by me on the following activities: development of treatment plan with patient and/or surrogate as well as nursing, discussions with consultants, evaluation of patient's response to treatment, examination of patient, obtaining history from patient or surrogate, ordering and performing treatments and interventions, ordering and review of laboratory studies, ordering and review of radiographic studies, pulse oximetry and re-evaluation of patient's condition.  MDM   Final diagnoses:  Stroke  SVT (supraventricular tachycardia)    4:13 PM 81 y.o. male w hx of afib, CAD who presents with acute mental status change which occurred at 2:50 PM today while eating with his brothers. The family reports that he became aphasic with decreased responsiveness. He arrived as a code stroke. Neurology has discussed the options with the family and the plan is to transfer him to [facility]  for embolectomy. The patient has a GCS of 9 and rhonchorous lung sounds suspicious for aspiration. I believe he is at risk or decompensating in route. I discussed this with the family who authorized intubation of the patient prior to departure.  Patient with a heart rate intermittently going up to the 150s but returning to the 90s to low 100s after only a few seconds.   Critical care documented in this patient with altered mental status, GCS of 9, requiring intubation and emergent transfer for embolectomy.  Pamella Pert, MD 06/21/15 Laureen Abrahams

## 2015-06-20 NOTE — Assessment & Plan Note (Signed)
Patient is complaining of increasing fatigue, productive cough, and some mild increase shortness of breath.  He does appear to have bronchitis-type symptoms.  On exam today.  Patient will be prescribed Levaquin and a biotics and albuterol inhalers to try at home.  Patient was also given IV fluid rehydration while at the [place] today.  Patient was encouraged to push fluids at home; and to remain as active as possible.

## 2015-06-20 NOTE — Assessment & Plan Note (Signed)
Patient is status post radiation treatments to both the brain and the chest.  He completed his last [place] plan/Alimta chemotherapy on 05/20/2015.  The plan is for the patient to return on 08/20/2015 for labs and a follow-up visit.  He knows to call/return earlier for any worsening symptoms whatsoever.

## 2015-06-20 NOTE — ED Provider Notes (Signed)
INTUBATION Performed by: Tori Milks  Required items: required blood products, implants, devices, and special equipment available Patient identity confirmed: provided demographic data and hospital-assigned identification number Time out: Immediately prior to procedure a "time out" was called to verify the correct patient, procedure, equipment, support staff and site/side marked as required.  Indications: airway protection due to AMS  Intubation method:Direct layngoscopy with Mac 3  Preoxygenation: O2 by [place]   Sedatives: 20 mg Etomidate Paralytic: 100 mg rocuronium  Tube Size: 7.0 cuffed at 22 at the lip  Post-procedure assessment: chest rise and ETCO2 monitor Breath sounds: equal and absent over the epigastrium Tube secured with: ETT holder Chest x-ray interpreted by radiologist and me.  Chest x-ray findings: endotracheal tube in appropriate position  Patient tolerated the procedure well with no immediate complications.    Dr. Aline Brochure, ED attending, was at bedside throughout the entire procedure.   Tori Milks, MD  Tori Milks, MD 07-21-2015 [place], MD 07-21-2015 [phone]

## 2015-06-20 NOTE — Assessment & Plan Note (Signed)
Potassium was down to 3.3.  The patient will be prescribed potassium 20 mg to take on a daily basis.

## 2015-06-20 NOTE — ED Notes (Signed)
Pt brought in for right sided paralysis.  LSN [id number].  Symptoms came on suddenly and was witnessed by family.

## 2015-06-20 NOTE — Progress Notes (Signed)
SYMPTOM MANAGEMENT CLINIC   HPI: Luke Contreras 81 y.o. male diagnosed with lung cancer; with brain metastasis.  Patient is status post radiation therapy to both his brain and his chest.  Recently completed carboplatin/Alimta chemotherapy regimen.  Patient complaining of increased productive cough of green secretions and shortness of breath within the past week or so.  He denies any fevers or chills recently.  He denies any chest pain, chest pressure, or pain with inspiration.   Cough    Review of Systems  Respiratory: Positive for cough.     Past Medical History  Diagnosis Date  . History of acute anterior wall myocardial infarction 11/2006  . Atrial fibrillation   . Dyslipidemia   . Hypertension   . Coronary artery disease     history of  obstructive single-valve disease  . PAC (premature atrial contraction)   . Left ventricular dysfunction     withEF of 40-45%   . Complication of anesthesia   . Shortness of breath dyspnea     " when talking and completing a long sentence  . GERD (gastroesophageal reflux disease)   . Arthritis     hands, knees  . Cancer     skin cancer- side of nausea    Past Surgical History  Procedure Laterality Date  . Cardiac catheterization  11/26/2006    Est. EF of 40% --  A 100% obstructive proximal/mid left anterior descending artery consistent with recent anterior myocardial infarction --  Moderate mid right coronary artery disease --Mild left ventricular dysfunction with EF of 40%  with distal anterior akinesis and periapical dyskinesis    -- Kaylyn Lim., M.D.  . Colonoscopy    . Video bronchoscopy with endobronchial ultrasound N/A 01/11/2015    Procedure: VIDEO BRONCHOSCOPY WITH ENDOBRONCHIAL ULTRASOUND;  Surgeon: Grace Isaac, MD;  Location: [facility] OR;  Service: Thoracic;  Laterality: N/A;  . 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    is allergic to cephalexin and aspirin.    Medication List       This list is accurate as of: 06/18/15 11:59 PM.  Always use your most recent med list.               albuterol 108 (90 BASE) MCG/ACT inhaler  Commonly known as:  PROVENTIL HFA;VENTOLIN HFA  Inhale 2 puffs into the lungs every 6 (six) hours as needed for wheezing or shortness of breath.     alum & mag hydroxide-simeth 200-200-20 MG/5ML suspension  Commonly known as:  MAALOX/MYLANTA  Take by mouth every 6 (six) hours as needed for indigestion or heartburn.     atorvastatin 40 MG tablet  Commonly known as:  LIPITOR  TAKE 1/2 TABLET (= 20 MG   TOTAL) DAILY     clorazepate 7.5 MG tablet  Commonly known as:  TRANXENE  Take 7.5 mg by mouth at bedtime.     CORICIDIN HBP CONGESTION/COUGH PO  Take by  mouth daily as needed.     dexamethasone 4 MG tablet  Commonly known as:  DECADRON  Take 1 tablet by mouth twice daily with food the day before, the day of and the day after chemotherapy     emollient cream  Commonly known as:  BIAFINE  Apply topically as needed.     fluorouracil 5 % cream  Commonly known as:  EFUDEX  Apply 1 application topically daily.     folic acid 1 MG tablet  Commonly known as:  FOLVITE  Take 1 tablet (1 mg total) by mouth daily.     levofloxacin 500 MG tablet  Commonly known as:   LEVAQUIN  Take 1 tablet (500 mg total) by mouth daily.     lidocaine 2 % solution  Commonly known as:  XYLOCAINE  Patient: Mix 1part 2% viscous lidocaine, 1part H20. Swish and/or swallow 11m of this mixture, 314m before meals and at bedtime, up to QID     lisinopril 2.5 MG tablet  Commonly known as:  PRINIVIL,ZESTRIL  Take 1 tablet (2.5 mg total) by mouth daily.     magic mouthwash w/lidocaine Soln  Take 5 mLs by mouth 4 (four) times daily as needed for mouth pain ([employer] mouthwash formula w/ lidocaine 1:1.).     multivitamin per tablet  Take 1 tablet by mouth daily.     nitroGLYCERIN 0.4 MG SL tablet  Commonly known as:  NITROSTAT  Place 1 tablet (0.4 mg total) under the tongue every 5 (five) minutes as needed for chest pain.     omeprazole 20 MG capsule  Commonly known as:  PRILOSEC  Take 20 mg by mouth daily.     OSTEO BI-FLEX JOINT SHIELD PO  Take 1 tablet by mouth daily.     oxyCODONE-acetaminophen 5-325 MG per tablet  Commonly known as:  PERCOCET/ROXICET  Take 1 tablet by mouth every 4 (four) hours as needed for severe pain.     prochlorperazine 10 MG tablet  Commonly known as:  COMPAZINE  Take 1 tablet (10 mg total) by mouth every 6 (six) hours as needed for nausea or vomiting.     RADIAPLEX EX  Apply topically.     sucralfate 1 G tablet  Commonly known as:  CARAFATE  Dissolve 1 tablet in 10 mL H20 and swallow up to QID, in between meals, to soothe esophagus.     VITAMIN B-12 PO  Take 500 mcg by mouth daily.         PHYSICAL EXAMINATION  Oncology Vitals 06/18/2015 06/10/2015 05/21/2015 05/21/2015 05/21/2015 05/20/2015 05/20/2015  Height - 175 cm - - - - -  Weight 76.794 kg 79.379 kg - - - - -  Weight (lbs) 169 lbs 5 oz 175 lbs - - - - -  BMI (kg/m2) - 25.84 kg/m2 - - - - -  Temp 99.1 98.9 97.8 97.6 97.6 97.8 98.6  Pulse 115 100 61 67 60 71 78  Resp _0 SpO2 92 99 - - - 97 97  BSA (m2) - 1.97 m2 - - - - -   BP Readings from Last 3 Encounters:   06/18/15 109/61  06/10/15 120/61  05/21/15 122/52    Physical Exam  Constitutional: He is oriented to person, place, and time. Vital signs are normal. He appears dehydrated. He appears unhealthy.  Patient appears fatigued, weak, and chronically ill.  HENT:  Head: Normocephalic and atraumatic.  Mouth/Throat: Oropharynx is clear and moist.  Patient has a hoarse voice.  Patient managing all oral secretions with no difficulty.  Eyes: Conjunctivae and EOM are normal. Pupils are equal, round, and reactive to light. Right eye exhibits no discharge. Left eye exhibits no discharge. No scleral icterus.  Neck: Normal range of motion. Neck supple. No JVD present. No tracheal deviation present. No thyromegaly present.  Cardiovascular: Normal rate, regular rhythm, normal heart sounds and intact distal pulses.   Pulmonary/Chest: No stridor. No respiratory distress. He has no wheezes. He has rales. He exhibits no tenderness.  Patient with bilateral rhonchi and congestion.  Mild shortness of breath on exam.  No cough on exam.  Abdominal: Soft. Bowel sounds are normal. He exhibits no distension and no mass. There is no tenderness. There is no rebound and no guarding.  Musculoskeletal: Normal range of motion. He exhibits no edema or tenderness.  Lymphadenopathy:    He has no cervical adenopathy.  Neurological: He is alert and oriented to person, place, and time.  Skin: Skin is warm and dry. No rash noted. No erythema. There is pallor.  Psychiatric: Affect normal.  Nursing note and vitals reviewed.   LABORATORY DATA:. Appointment on 06/18/2015  Component Date Value Ref Range Status  . WBC 06/18/2015 8.0  4.0 - 10.3 10e3/uL Final  . NEUT# 06/18/2015 6.4  1.5 - 6.5 10e3/uL Final  . HGB 06/18/2015 10.4* 13.0 - 17.1 g/dL Final  . HCT 06/18/2015 31.6* 38.4 - 49.9 % Final  . Platelets 06/18/2015 206  140 - 400 10e3/uL Final  . MCV 06/18/2015 96.4  79.3 - 98.0 fL Final  . MCH 06/18/2015 31.7  27.2 - 33.4  pg Final  . MCHC 06/18/2015 32.9  32.0 - 36.0 g/dL Final  . RBC 06/18/2015 3.28* 4.20 - 5.82 10e6/uL Final  . RDW 06/18/2015 20.6* 11.0 - 14.6 % Final  . lymph# 06/18/2015 0.5* 0.9 - 3.3 10e3/uL Final  . MONO# 06/18/2015 1.0* 0.1 - 0.9 10e3/uL Final  . Eosinophils Absolute 06/18/2015 0.0  0.0 - 0.5 10e3/uL Final  . Basophils Absolute 06/18/2015 0.0  0.0 - 0.1 10e3/uL Final  . NEUT% 06/18/2015 80.4* 39.0 - 75.0 % Final  . LYMPH% 06/18/2015 5.9* 14.0 - 49.0 % Final  . MONO% 06/18/2015 12.9  0.0 - 14.0 % Final  . EOS% 06/18/2015 0.3  0.0 - 7.0 % Final  . BASO% 06/18/2015 0.5  0.0 - 2.0 % Final  . Sodium 06/18/2015 135* 136 - 145 mEq/L Final  . Potassium 06/18/2015 3.3* 3.5 - 5.1 mEq/L Final  . Chloride 06/18/2015 98  98 - 109 mEq/L Final  . CO2 06/18/2015 25  22 - 29 mEq/L Final  . Glucose 06/18/2015 153* 70 - 140 mg/dl Final  . BUN 06/18/2015 12.7  7.0 - 26.0 mg/dL Final  . Creatinine 06/18/2015 0.8  0.7 - 1.3 mg/dL Final  . Total Bilirubin 06/18/2015 1.15  0.20 - 1.20 mg/dL Final  . Alkaline Phosphatase 06/18/2015 99  40 - 150 U/L Final  . AST 06/18/2015 35* 5 - 34 U/L Final  . ALT 06/18/2015 16  0 - 55 U/L Final  . Total Protein 06/18/2015 6.2* 6.4 - 8.3 g/dL Final  . Albumin 06/18/2015 2.9* 3.5 - 5.0 g/dL Final  . Calcium 06/18/2015 9.1  8.4 - 10.4 mg/dL Final  . Anion Gap 06/18/2015 13* 3 - 11 mEq/L Final  . EGFR 06/18/2015 81* >90 ml/min/1.73 m2 Final   eGFR is calculated using the CKD-EPI Creatinine Equation (2009)  . Urine Culture, Routine  06/18/2015 Culture, Urine   Final   Comment: Final - ===== COLONY COUNT: ===== 40,000 COLONIES/ML Multiple bacterial morphotypes present, none predominant. Suggest appropriate recollection if  clinically indicated.   . Hold Tube, Blood Bank 06/18/2015 Blood Bank Order Cancelled   Final     RADIOGRAPHIC STUDIES: Dg Chest 2 View  06/18/2015   CLINICAL DATA:  81 year old male with productive cough and history of non-small cell left lung  cancer. Status post thoracic radiation treatment. Subsequent encounter.  EXAM: CHEST  2 VIEW  COMPARISON:  Recent chest CT 06/07/2015 and earlier  FINDINGS: Lung volumes appear stable with volume loss in the left hemi thorax. Mediastinal contours appear stable.  Architectural distortion and/or collapse in the left upper lung appears less severe than on the recent chest CT with associated new or increased left perihilar interstitial opacity (arrow). No pneumothorax or pleural effusion. Lung markings elsewhere appears stable. Visualized tracheal air column is within normal limits. No acute osseous abnormality identified.  IMPRESSION: 1. Improved left upper lung ventilation with less lung collapse suspected compared to the recent CT on 06/07/2015. 2. Residual left perihilar interstitial opacity such that superimposed infection is not excluded. 3. No pleural effusion.   Electronically Signed   By: Genevie Ann M.D.   On: 06/18/2015 13:49    ASSESSMENT/PLAN:    Bronchitis Patient complaining of increased productive cough of green secretions and shortness of breath within the past week or so.  He denies any fevers or chills recently.  He denies any chest pain, chest pressure, or pain with inspiration.  On exam.-Patient with rhonchi bilaterally; and occasional wheeze.  No active cough on exam.  Trace shortness of breath only with conversation.  Patient consistently has a horse voice as his baseline.  Vital signs revealed a heart rate of 115, O2 sat 92% on room air, and temperature of 99.1.  Chest x-ray obtained today revealed improved left upper lung ventilation with less lung collapse suspected.  Compared to the recent CT on 06/07/2015.  Residual left perihilar interstitial up a city such that superimposed infection is not excluded.  No pleural effusion.  Patient received albuterol nebulizer treatments 2 while at the cancer center; which did greatly improve patient's congestion and shortness of breath.  Patient  will be prescribed Levaquin and a biotics and also be given a prescription for albuterol inhalers to use at home.  Patient was advised to call/return or go directly to the emergency department for any worsening symptoms whatsoever.  Dehydration Patient is complaining of increased fatigue; as well as bronchitis-type symptoms.  He feels slightly dehydrated today.  Patient will receive IV fluid rehydration while at the cancer Center today.  He was also encouraged to push fluids at home is much as possible.  Hypoalbuminemia Appear that has decreased to 2.9.  Patient was encouraged to push protein in his diet is much as possible.  Hypokalemia Potassium was down to 3.3.  The patient will be prescribed potassium 20 mg to take on a daily basis.  Neoplastic malignant related fatigue Patient is complaining of increasing fatigue, productive cough, and some mild increase shortness of breath.  He does appear to have bronchitis-type symptoms.  On exam today.  Patient will be prescribed Levaquin and a biotics and albuterol inhalers to try at home.  Patient was also given IV fluid rehydration while at the [place] today.  Patient was encouraged to push fluids at home; and to remain as active as possible.  Non-small cell cancer of left  lung Patient is status post radiation treatments to both the brain and the chest.  He completed his last [place] plan/Alimta chemotherapy on 05/20/2015.  The plan is for the patient to return on 08/20/2015 for labs and a follow-up visit.  He knows to call/return earlier for any worsening symptoms whatsoever.    Patient stated understanding of all instructions; and was in agreement with this plan of care. The patient knows to call the clinic with any problems, questions or concerns.   All details of today's visit were reviewed with Dr. Julien Nordmann.  Total time spent with patient was 40 minutes;  with greater than 75 percent of that time spent in face to face counseling  regarding patient's symptoms,  and coordination of care and follow up.  Disclaimer: This note was dictated with voice recognition software. Similar sounding words can inadvertently be transcribed and may not be corrected upon review.   Drue Second, NP 06/20/2015   ADDENDUM: Hematology/Oncology Attending: I had a face to face encounter with the patient. I recommended his care plan. This is a very pleasant 81 years old white male with metastatic non-small cell lung cancer, adenocarcinoma currently undergoing systemic chemotherapy was carboplatin and Alimta status post 3 cycles. The patient also received palliative radiotherapy to the chest. He presented today complaining of sore throat as well as dysphagia and questionable radiation induced esophagitis. He was advised to continue Carafate and viscous lidocaine. The patient was also found to have chemotherapy-induced neutropenia with no fever. We will arrange for the patient to start Granix 300 g subcutaneously daily for 3 days. He will also be started empirically on Cipro 500 mg by mouth twice a day for 5 days. For the chemotherapy-induced anemia, we will arrange for the patient to receive 2 units of PRBCs transfusion. He would come back for follow-up visit as previously scheduled with repeat CT scan of the chest, abdomen and pelvis for restaging of his disease.  Disclaimer: This note was dictated with voice recognition software. Similar sounding words can inadvertently be transcribed and may be missed upon review. Drue Second, NP 06/20/2015

## 2015-06-21 ENCOUNTER — Inpatient Hospital Stay (HOSPITAL_COMMUNITY)
Admission: AD | Admit: 2015-06-21 | Payer: Medicare Other | Source: Other Acute Inpatient Hospital | Admitting: Neurology

## 2015-06-21 NOTE — Consult Note (Deleted)
This note was started in anticipation to pending transfer which did not happen.  Erick Colace ACNP-BC [place] Pager # [phone] OR # [phone] if no answer

## 2015-07-01 ENCOUNTER — Telehealth: Payer: Self-pay | Admitting: *Deleted

## 2015-07-01 NOTE — Telephone Encounter (Signed)
Voicemail received from [facility] with [facility] Radiology Scheduling.  I called patient to schedule CT Chest.  He pased away last week.  Per his wife he is deceased.  Please update your records."  Checked News and record Luke Contreras and confirmed June 28, 2015 as date of departure.

## 2015-07-15 DEATH — deceased

## 2015-08-20 ENCOUNTER — Other Ambulatory Visit: Payer: Medicare Other

## 2015-08-20 ENCOUNTER — Ambulatory Visit: Payer: Medicare Other | Admitting: Internal Medicine

## 2016-02-28 ENCOUNTER — Other Ambulatory Visit: Payer: Self-pay | Admitting: Nurse Practitioner

## 2016-07-07 IMAGING — CR DG NECK SOFT TISSUE
2 series · 2 of 2 positions shown · non-contrast
Comparison: Chest radiographs obtained at the same time and on
11/27/2006.

CLINICAL DATA: Hoarseness.

EXAM:
NECK SOFT TISSUES - 1+ VIEW

[AP]
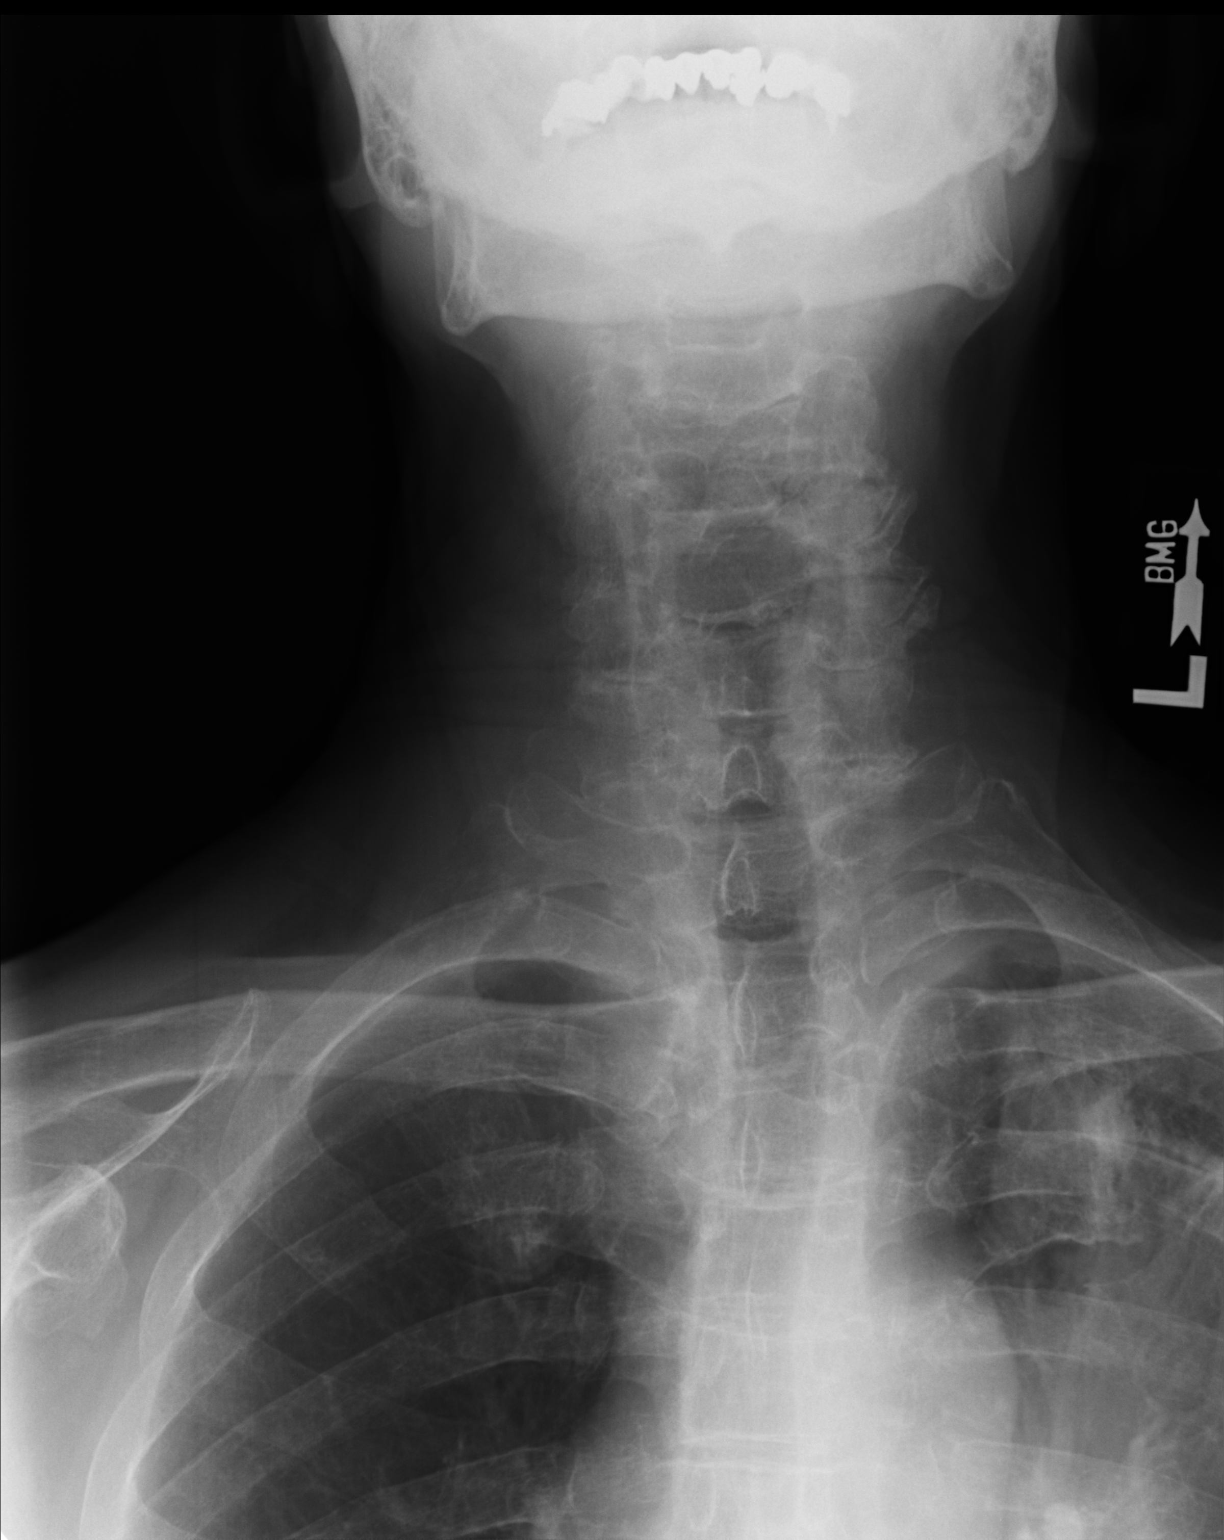

[lateral]
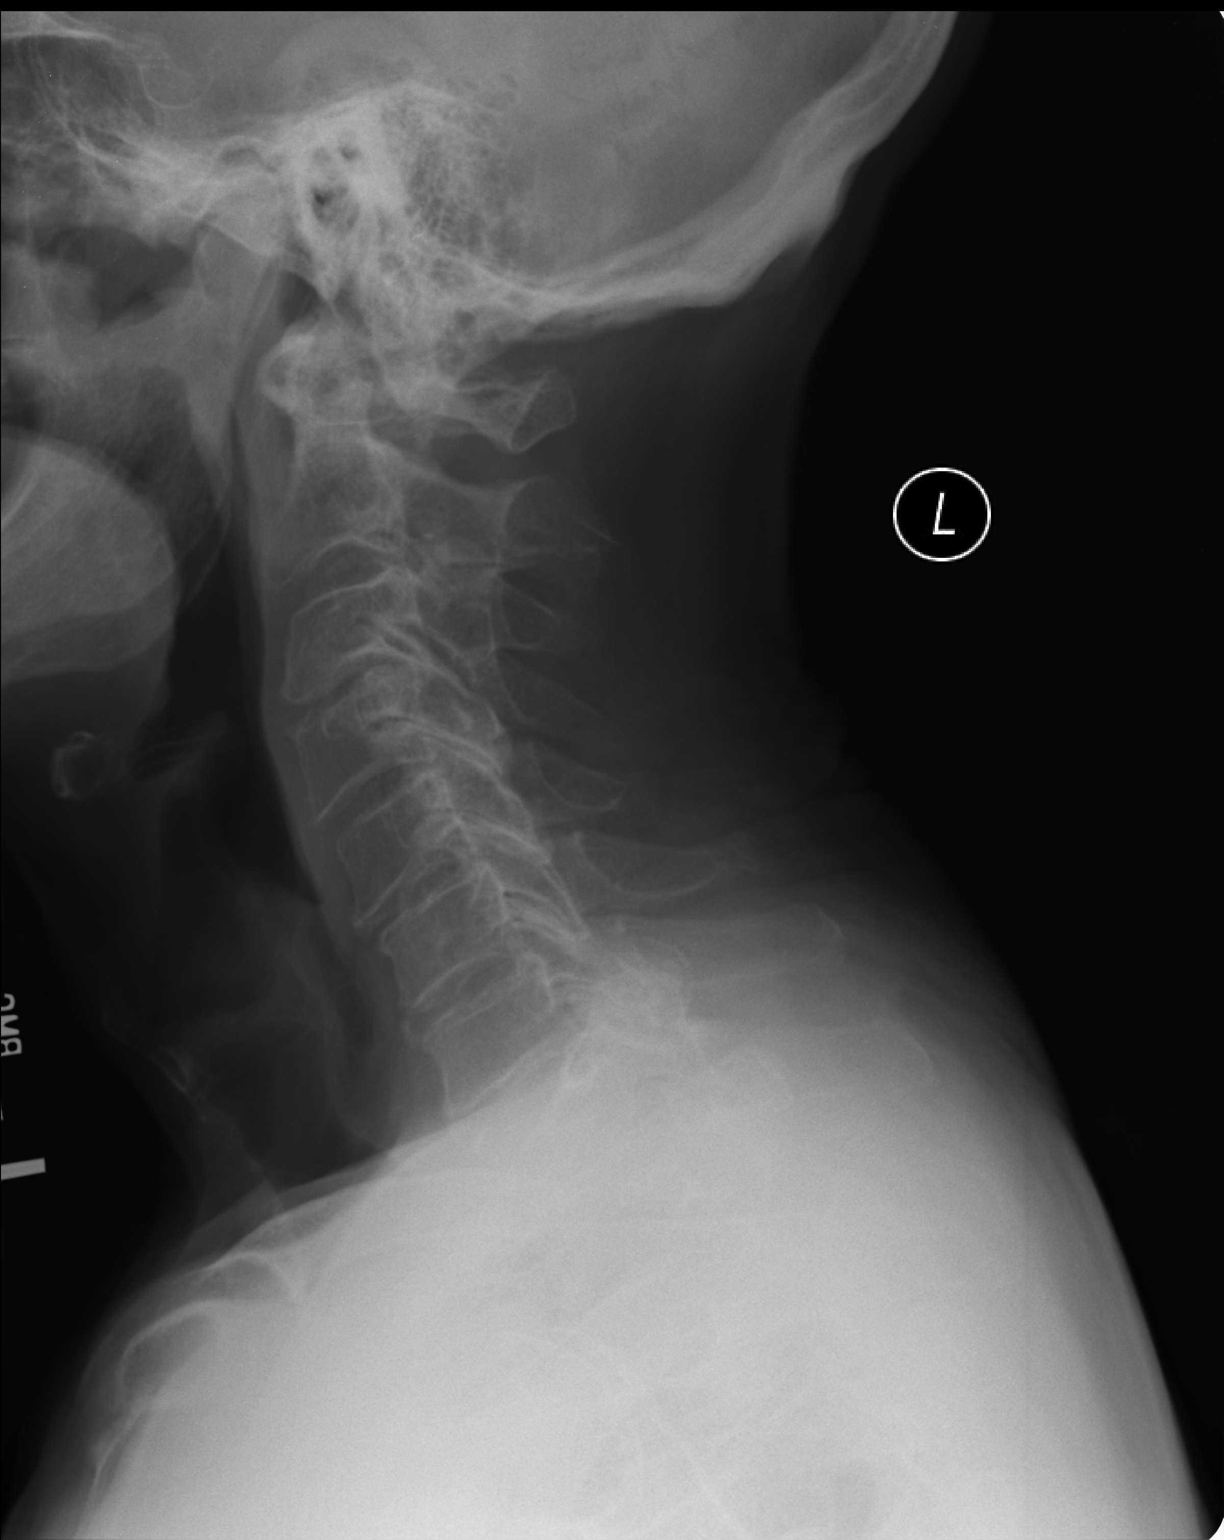

[2 of 2 positions shown; findings below may reference images not displayed]

FINDINGS: The soft tissues of the neck have normal appearances. Cervical spine
degenerative changes are noted. Increased in scratch increased
density in the left hemithorax will be discussed on the chest
radiographs report.
IMPRESSION: 1. And normal appearing neck soft tissues and airway.
2. Cervical spine degenerative changes.
3. Increased density in the left hemothorax, discussed in the chest
radiographs report.

## 2016-07-11 IMAGING — CT CT CHEST W/ CM
2 of 3 series · 15 of 36 positions shown, 18 images · IV contrast (omnipaque)
Comparison: Chest x-ray 12/29/2014.

CLINICAL DATA: Left hilar mass. Occasional productive cough for 5
weeks. Shortness of breath.

EXAM:
CT CHEST WITH CONTRAST
TECHNIQUE: Multidetector CT imaging of the chest was performed during
intravenous contrast administration.
CONTRAST:  75 cc Omnipaque 300 IV.

[Series 2: chest with · axial · 0.70mm/px · z∈[-305,-15]mm · 12 of 70 slices shown, 15 images]
[im 6/70  mediastinal]
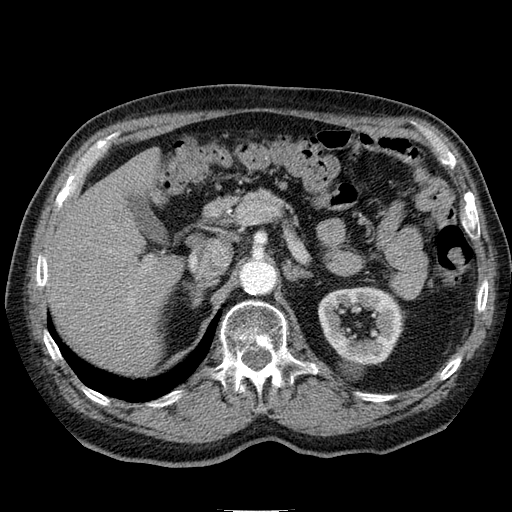
[im 6/70  lung]
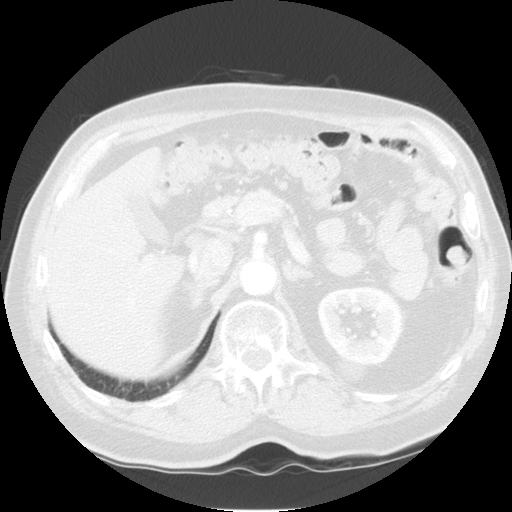
[im 11/70  lung]
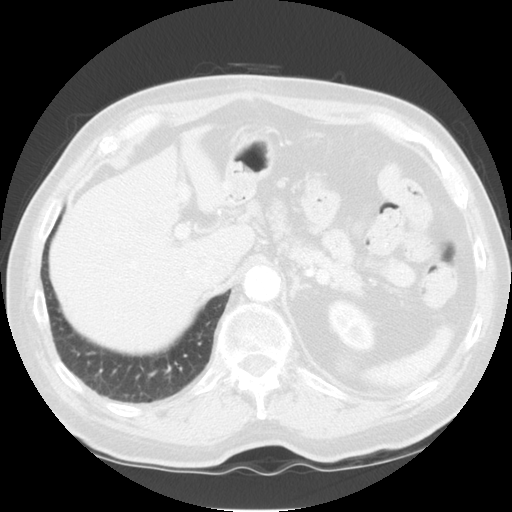
[im 16/70  lung]
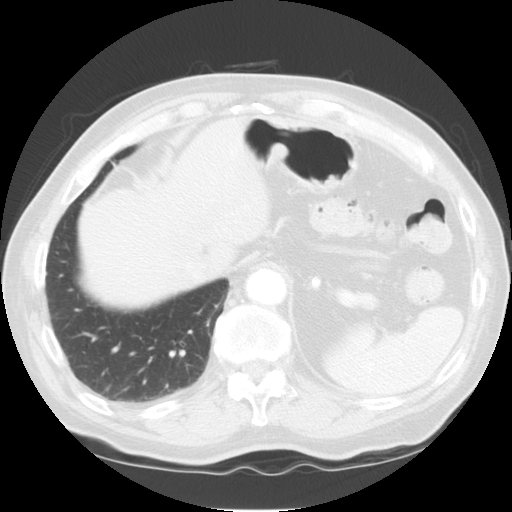
[im 21/70  lung]
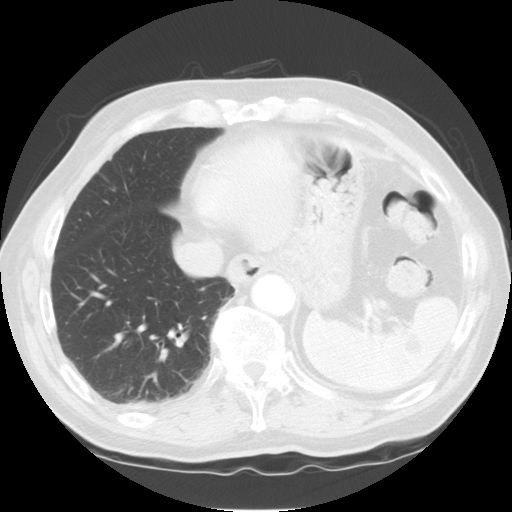
[im 26/70  mediastinal]
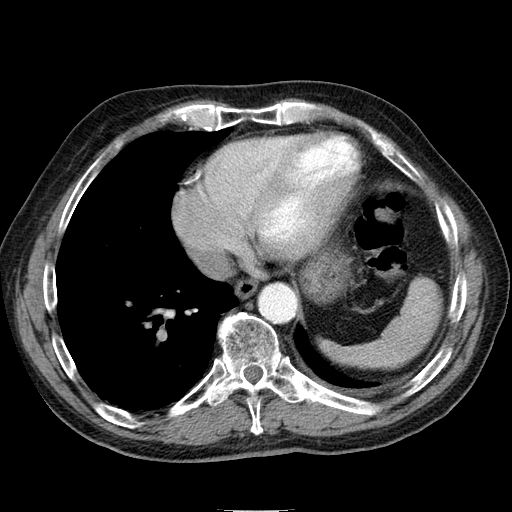
[im 26/70  lung]
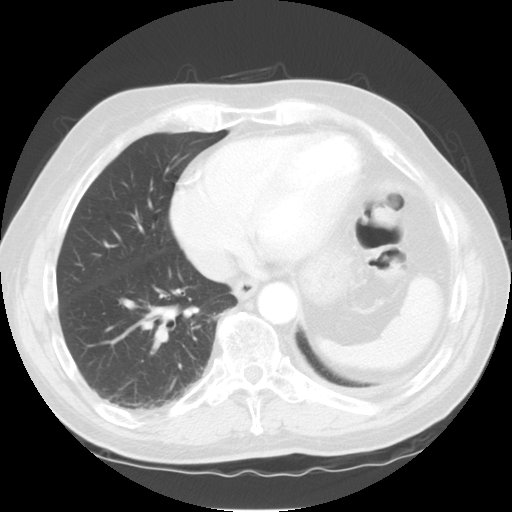
[im 31/70  lung]
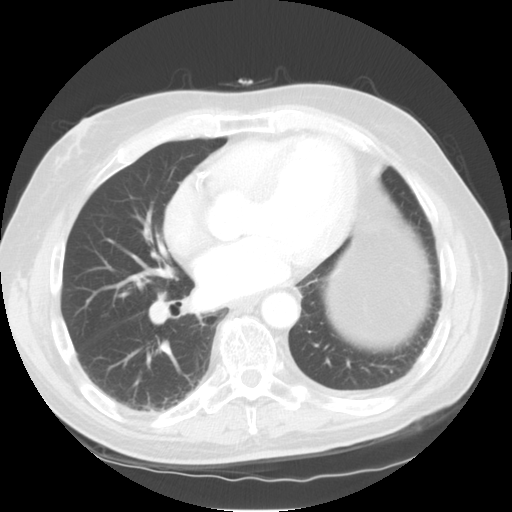
[im 39/70  lung]
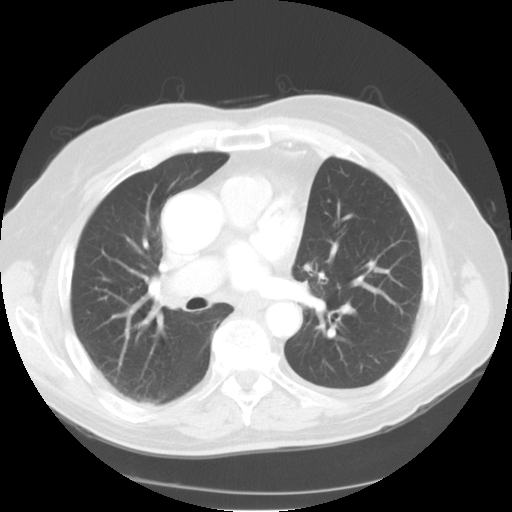
[im 44/70  lung]
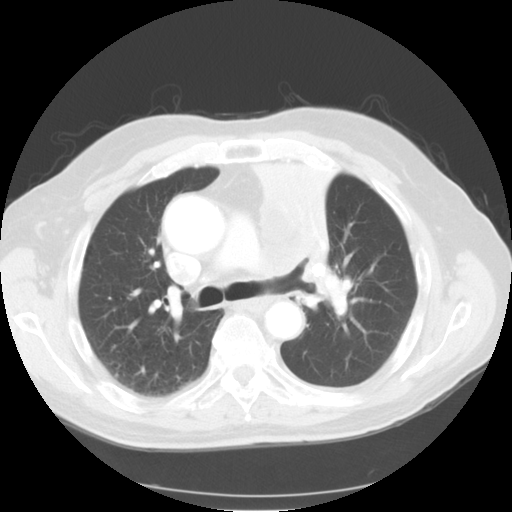
[im 49/70  mediastinal]
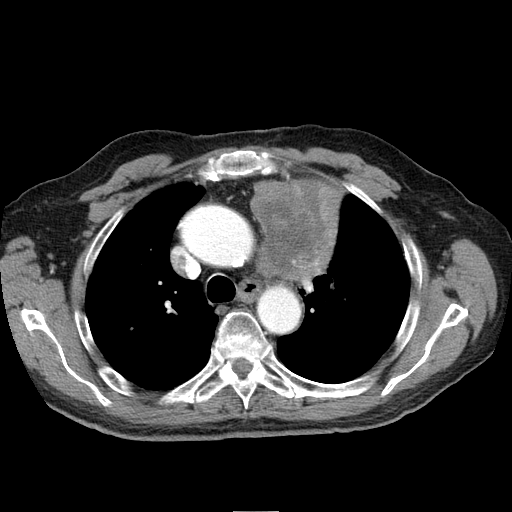
[im 49/70  lung]
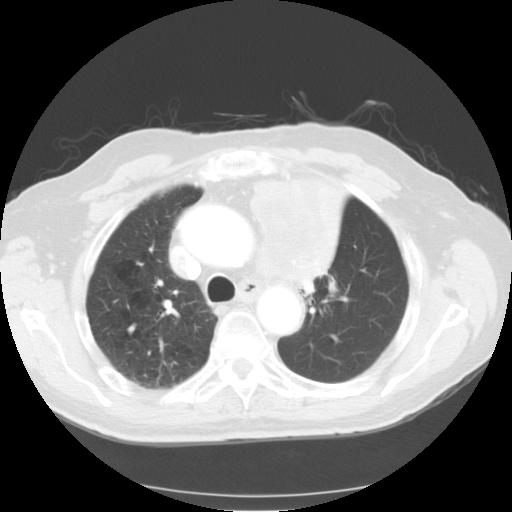
[im 54/70  lung]
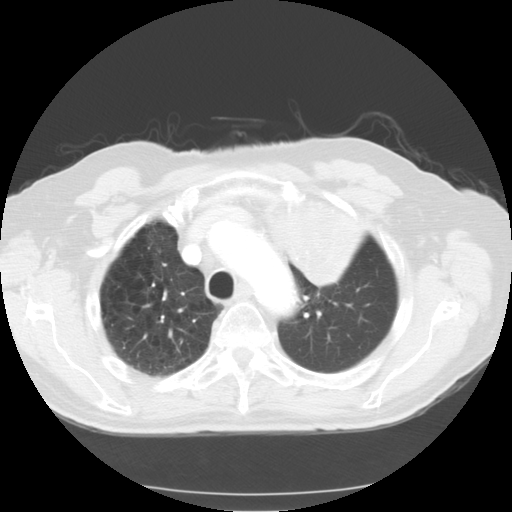
[im 59/70  lung]
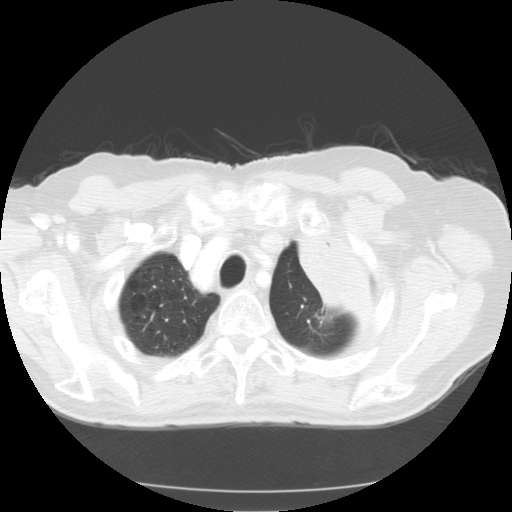
[im 64/70  lung]
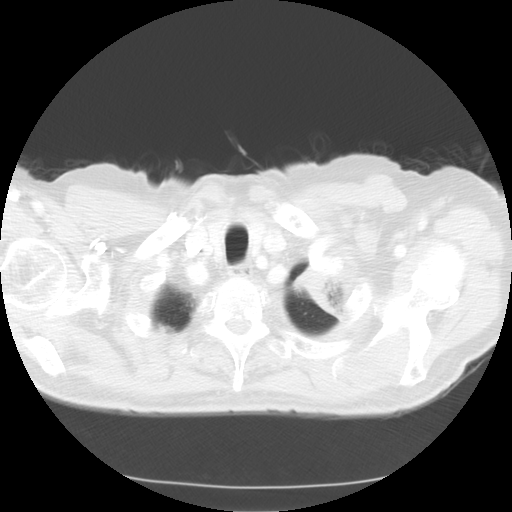

[Series 400: cor · coronal · 0.70mm/px · 3 of 141 slices shown]
[im 29/141  lung]
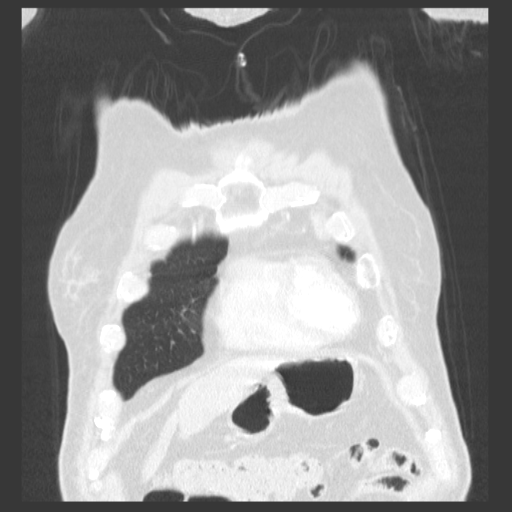
[im 57/141  lung]
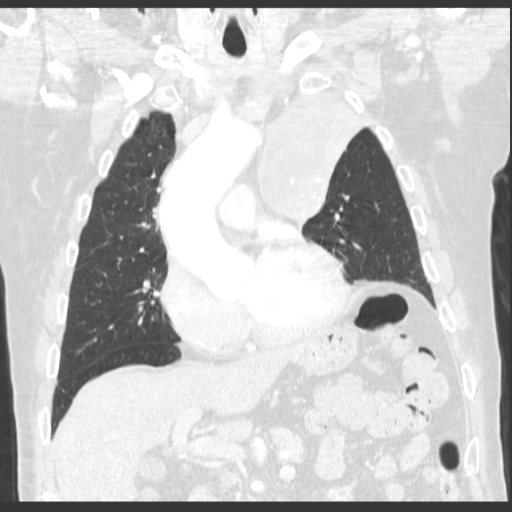
[im 85/141  lung]
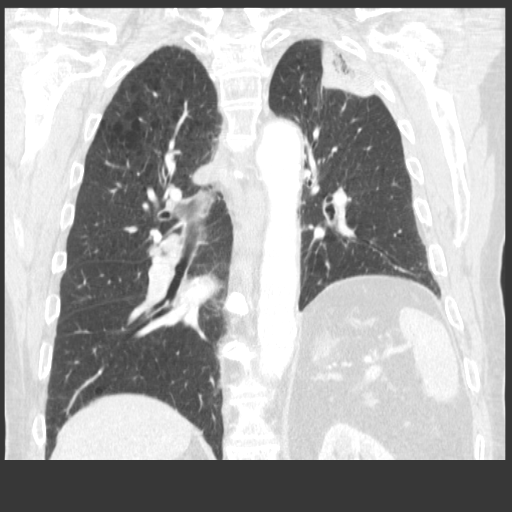

[15 of 36 positions shown; findings below may reference images not displayed]

FINDINGS: There is a large necrotic-appearing left hilar and suprahilar mass
with associated left upper lobe collapse. It is difficult to measure
due to the collapsed left upper lobe. Central left scratch has
central low-density area measures approximately 4.5 x 2.8 cm on
image 27 anteriorly in the left hilum/suprahilar region. The left
upper lobe bronchus occludes proximally.

Mild to moderate centrilobular emphysema. No confluent opacity or
nodule on the right. No pleural effusions.

Calcified left hilar and mediastinal lymph nodes compatible with old
granulomatous disease. No mediastinal, hilar or axillary adenopathy.
Heart is normal size. Scattered coronary artery calcifications.
Aorta is slightly dilated, 4 cm in the ascending thoracic aorta.

No acute bony abnormality or focal bone lesion. Imaging into the
upper abdomen shows no acute findings. Small low-density lesions
centrally within the spleen measuring 12 mm, most likely cyst or
hemangioma.
IMPRESSION: Occlusion of the left upper lobe bronchus with necrotic appearing
left hilar/ suprahilar mass and complete collapse of the left upper
lobe. Findings are concerning for central bronchogenic carcinoma.

Coronary artery disease.

Mild to moderate emphysema.
